# Patient Record
Sex: Female | Born: 1970 | Race: White | Hispanic: No | Marital: Married | Smoking: Former smoker
Health system: Southern US, Community
[De-identification: ages and names within clinical notes are randomized; demographics above are authoritative.]

## PROBLEM LIST (undated history)

## (undated) DIAGNOSIS — N200 Calculus of kidney: Secondary | ICD-10-CM

## (undated) DIAGNOSIS — F329 Major depressive disorder, single episode, unspecified: Secondary | ICD-10-CM

## (undated) DIAGNOSIS — N390 Urinary tract infection, site not specified: Secondary | ICD-10-CM

## (undated) DIAGNOSIS — K589 Irritable bowel syndrome without diarrhea: Secondary | ICD-10-CM

## (undated) DIAGNOSIS — K219 Gastro-esophageal reflux disease without esophagitis: Secondary | ICD-10-CM

## (undated) DIAGNOSIS — D649 Anemia, unspecified: Secondary | ICD-10-CM

## (undated) DIAGNOSIS — J45909 Unspecified asthma, uncomplicated: Secondary | ICD-10-CM

## (undated) DIAGNOSIS — F32A Depression, unspecified: Secondary | ICD-10-CM

## (undated) DIAGNOSIS — Z8619 Personal history of other infectious and parasitic diseases: Secondary | ICD-10-CM

## (undated) DIAGNOSIS — L509 Urticaria, unspecified: Secondary | ICD-10-CM

## (undated) HISTORY — DX: Anemia, unspecified: D64.9

## (undated) HISTORY — DX: Major depressive disorder, single episode, unspecified: F32.9

## (undated) HISTORY — DX: Depression, unspecified: F32.A

## (undated) HISTORY — PX: ADENOIDECTOMY: SUR15

## (undated) HISTORY — PX: TONSILLECTOMY: SUR1361

## (undated) HISTORY — DX: Personal history of other infectious and parasitic diseases: Z86.19

## (undated) HISTORY — DX: Irritable bowel syndrome, unspecified: K58.9

## (undated) HISTORY — DX: Gastro-esophageal reflux disease without esophagitis: K21.9

## (undated) HISTORY — DX: Calculus of kidney: N20.0

## (undated) HISTORY — DX: Urinary tract infection, site not specified: N39.0

## (undated) HISTORY — PX: ABDOMINAL HYSTERECTOMY: SHX81

## (undated) HISTORY — DX: Unspecified asthma, uncomplicated: J45.909

## (undated) HISTORY — DX: Urticaria, unspecified: L50.9

---

## 2003-02-15 ENCOUNTER — Ambulatory Visit (HOSPITAL_BASED_OUTPATIENT_CLINIC_OR_DEPARTMENT_OTHER): Admission: RE | Admit: 2003-02-15 | Discharge: 2003-02-15 | Payer: Self-pay | Admitting: Gynecology

## 2004-11-20 ENCOUNTER — Ambulatory Visit: Payer: Self-pay | Admitting: Family Medicine

## 2004-12-03 ENCOUNTER — Ambulatory Visit: Payer: Self-pay | Admitting: Family Medicine

## 2004-12-03 ENCOUNTER — Other Ambulatory Visit: Admission: RE | Admit: 2004-12-03 | Discharge: 2004-12-03 | Payer: Self-pay | Admitting: Family Medicine

## 2005-02-18 ENCOUNTER — Ambulatory Visit: Payer: Self-pay | Admitting: Family Medicine

## 2009-09-10 ENCOUNTER — Emergency Department (HOSPITAL_COMMUNITY): Admission: EM | Admit: 2009-09-10 | Discharge: 2009-09-10 | Payer: Self-pay | Admitting: Emergency Medicine

## 2009-09-10 IMAGING — CR DG OS CALCIS 2+V*R*
2 series · 2 of 2 positions shown · non-contrast
Comparison: None

CLINICAL DATA: Right heel pain.

RIGHT OS CALCIS - 2+ VIEW

[view not recorded (1 of 2)]
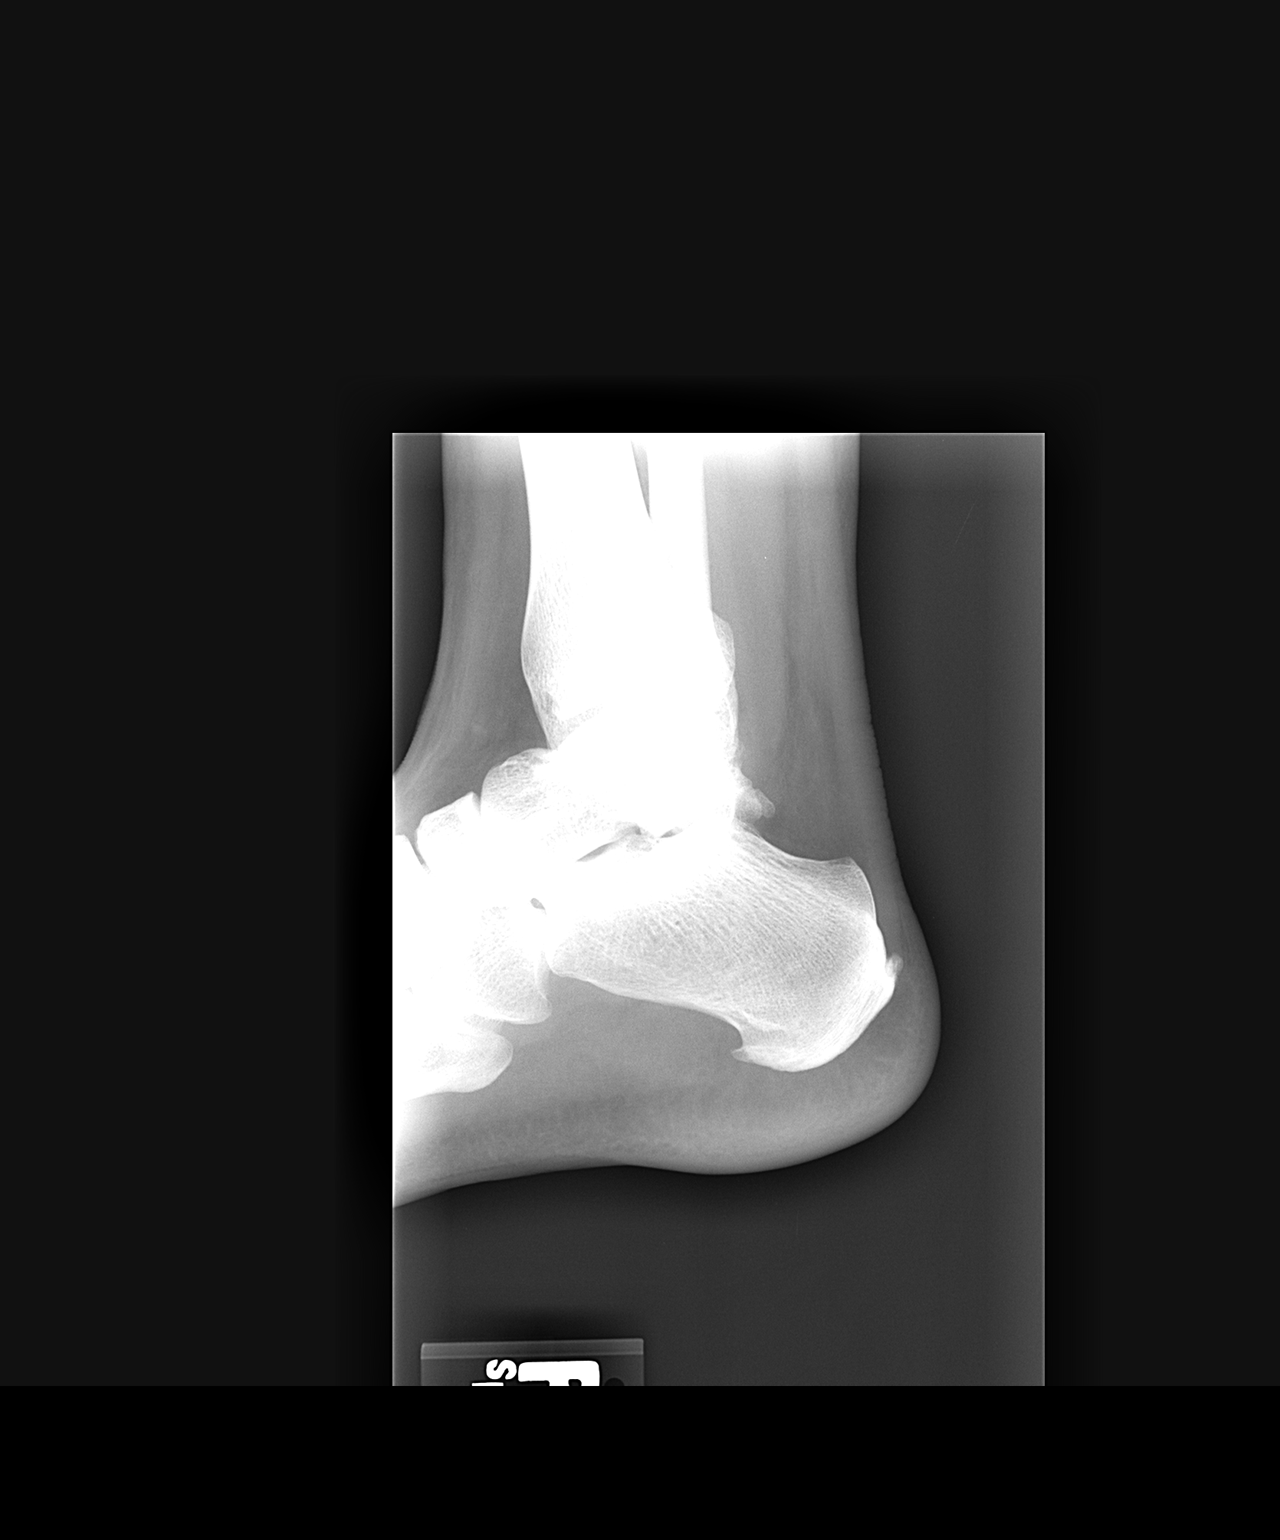

[view not recorded (2 of 2)]
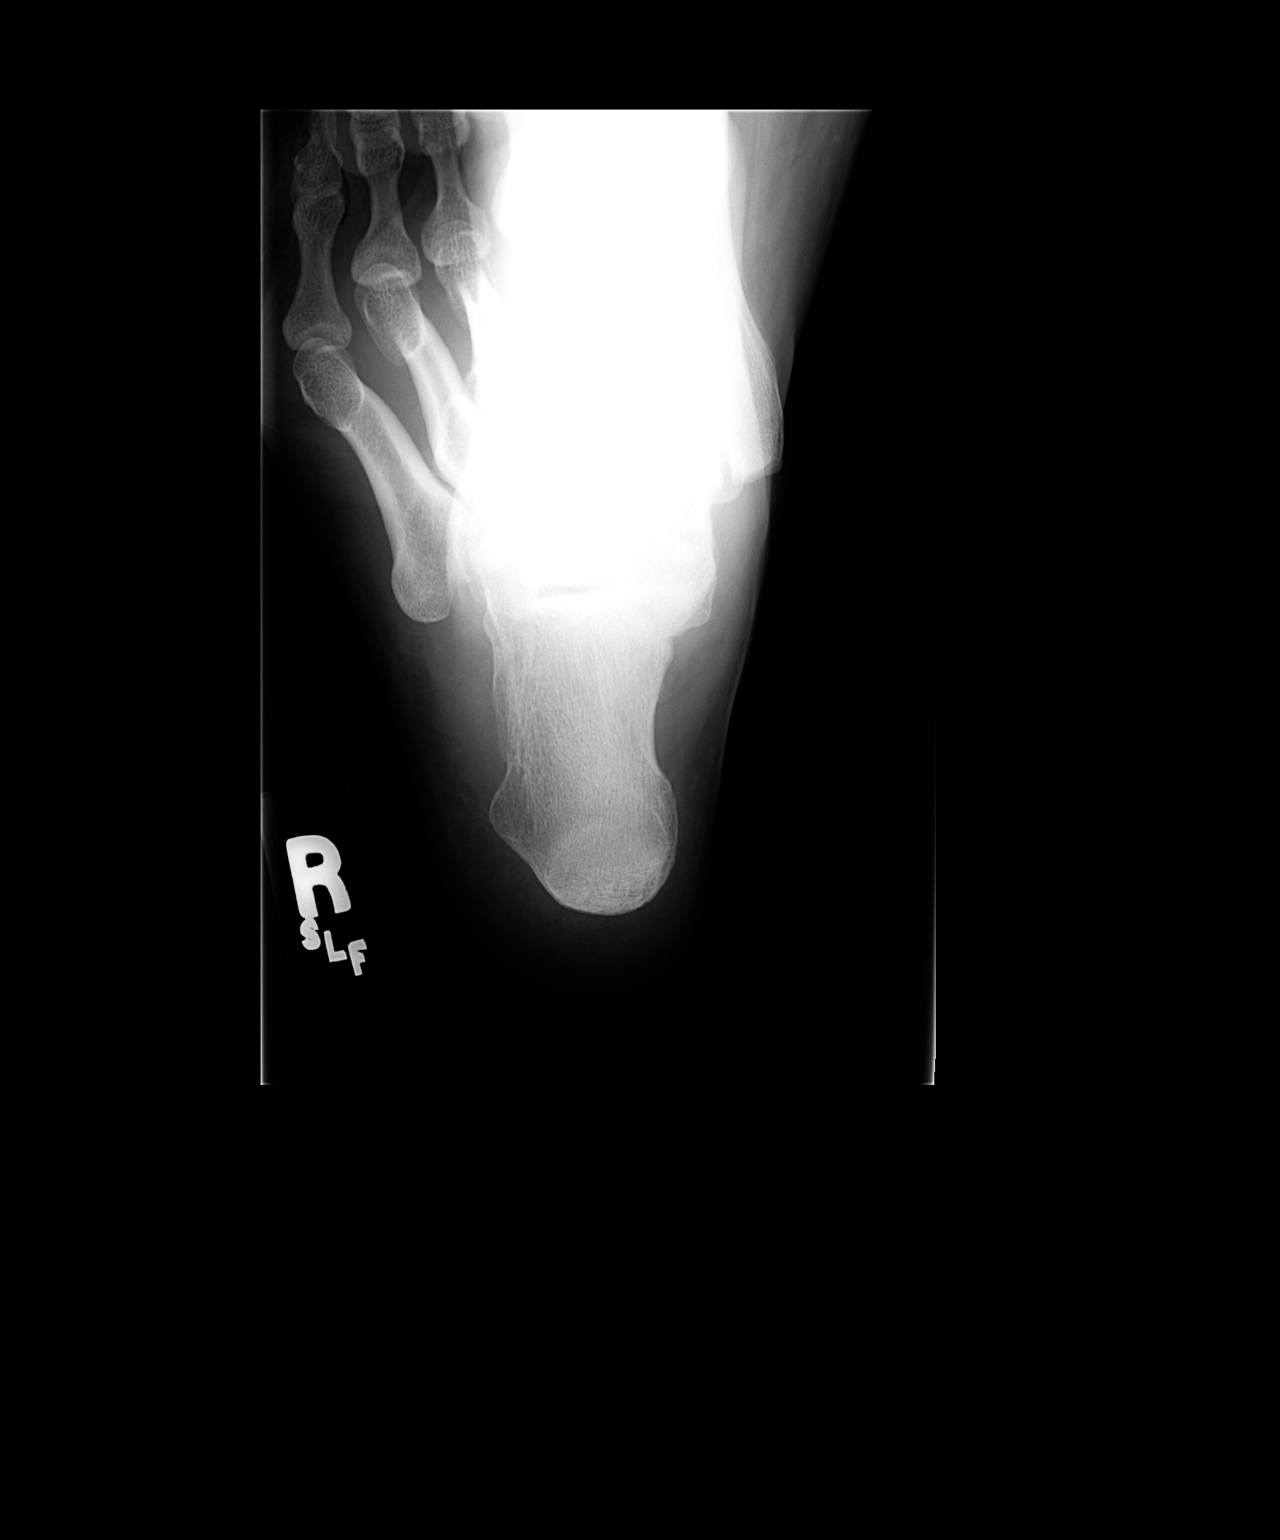

[2 of 2 positions shown; findings below may reference images not displayed]

FINDINGS: Plantar calcaneal spur is present. No acute bony
abnormality.  Specifically, no fracture, subluxation, or
dislocation.  Soft tissues are intact.
IMPRESSION: Small plantar calcaneal spur.

## 2010-03-18 ENCOUNTER — Ambulatory Visit: Payer: Self-pay | Admitting: Family Medicine

## 2010-03-18 DIAGNOSIS — K219 Gastro-esophageal reflux disease without esophagitis: Secondary | ICD-10-CM | POA: Insufficient documentation

## 2010-03-18 DIAGNOSIS — F32A Depression, unspecified: Secondary | ICD-10-CM | POA: Insufficient documentation

## 2010-03-18 DIAGNOSIS — F329 Major depressive disorder, single episode, unspecified: Secondary | ICD-10-CM

## 2010-03-18 DIAGNOSIS — M5417 Radiculopathy, lumbosacral region: Secondary | ICD-10-CM | POA: Insufficient documentation

## 2010-03-18 DIAGNOSIS — F3289 Other specified depressive episodes: Secondary | ICD-10-CM | POA: Insufficient documentation

## 2010-03-18 DIAGNOSIS — G43909 Migraine, unspecified, not intractable, without status migrainosus: Secondary | ICD-10-CM | POA: Insufficient documentation

## 2010-03-18 DIAGNOSIS — D649 Anemia, unspecified: Secondary | ICD-10-CM | POA: Insufficient documentation

## 2010-03-18 DIAGNOSIS — F3342 Major depressive disorder, recurrent, in full remission: Secondary | ICD-10-CM | POA: Insufficient documentation

## 2010-03-18 LAB — CONVERTED CEMR LAB
AST: 19 units/L (ref 0–37)
Albumin: 3.6 g/dL (ref 3.5–5.2)
Alkaline Phosphatase: 45 units/L (ref 39–117)
Basophils Absolute: 0 10*3/uL (ref 0.0–0.1)
Bilirubin, Direct: 0.1 mg/dL (ref 0.0–0.3)
CO2: 28 meq/L (ref 19–32)
Creatinine, Ser: 0.6 mg/dL (ref 0.4–1.2)
Eosinophils Absolute: 0.2 10*3/uL (ref 0.0–0.7)
Eosinophils Relative: 2.3 % (ref 0.0–5.0)
HCT: 36.3 % (ref 36.0–46.0)
HDL: 38.9 mg/dL — ABNORMAL LOW (ref >39.00)
LDL Cholesterol: 106 mg/dL — ABNORMAL HIGH (ref 0–99)
Lymphocytes Relative: 33.1 % (ref 12.0–46.0)
Lymphs Abs: 2.3 10*3/uL (ref 0.7–4.0)
Monocytes Absolute: 0.3 10*3/uL (ref 0.1–1.0)
Monocytes Relative: 3.6 % (ref 3.0–12.0)
Neutro Abs: 4.2 10*3/uL (ref 1.4–7.7)
Potassium: 3.8 meq/L (ref 3.5–5.1)
RDW: 13.2 % (ref 11.5–14.6)
Total CHOL/HDL Ratio: 4
Total Protein: 5.9 g/dL — ABNORMAL LOW (ref 6.0–8.3)
Triglycerides: 84 mg/dL (ref 0.0–149.0)
VLDL: 16.8 mg/dL (ref 0.0–40.0)

## 2010-03-30 ENCOUNTER — Ambulatory Visit: Payer: Self-pay | Admitting: Family Medicine

## 2010-03-30 DIAGNOSIS — F411 Generalized anxiety disorder: Secondary | ICD-10-CM | POA: Insufficient documentation

## 2010-03-30 DIAGNOSIS — G2581 Restless legs syndrome: Secondary | ICD-10-CM | POA: Insufficient documentation

## 2010-03-30 DIAGNOSIS — K589 Irritable bowel syndrome without diarrhea: Secondary | ICD-10-CM | POA: Insufficient documentation

## 2010-04-28 ENCOUNTER — Telehealth (INDEPENDENT_AMBULATORY_CARE_PROVIDER_SITE_OTHER): Payer: Self-pay | Admitting: *Deleted

## 2010-05-27 ENCOUNTER — Ambulatory Visit
Admission: RE | Admit: 2010-05-27 | Discharge: 2010-05-27 | Payer: Self-pay | Source: Home / Self Care | Attending: Family Medicine | Admitting: Family Medicine

## 2010-05-27 DIAGNOSIS — B379 Candidiasis, unspecified: Secondary | ICD-10-CM | POA: Insufficient documentation
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
## 2010-06-09 NOTE — Assessment & Plan Note (Signed)
Summary: CPX//PH   Vital Signs:  Patient profile:   40 year old female Height:      64 inches Weight:      158 pounds Pulse rate:   72 / minute BP sitting:   118 / 80  (left arm)  Vitals Entered By: Doristine Devoid CMA (March 30, 2010 10:02 AM) CC: CPX    History of Present Illness: 40 yo woman here today for CPE.  UTD on pap.  1) RLS- reports sxs increase when back pain increases.  pain has been more recently w/ change in weather.  last night pt was awake due to constant motion of legs.  2) Anxiety- feeling that she is doing well on 20mg  of citalopram.  previously on Valium but had excessive sedation.  therapy is intensifying and IBS is flaring- 'i just feel like i'm shaking all the time'.  Preventive Screening-Counseling & Management  Alcohol-Tobacco     Alcohol drinks/day: 0     Smoking Status: current     Smoking Cessation Counseling: yes     Smoke Cessation Stage: contemplative     Packs/Day: 0.75      Sexual History:  currently monogamous.        Drug Use:  never.    Current Medications (verified): 1)  Citalopram Hydrobromide 20 Mg Tabs (Citalopram Hydrobromide) .... Take One Tablet By Mouth Daily 2)  Cyclobenzaprine Hcl 5 Mg Tabs (Cyclobenzaprine Hcl) .Marland Kitchen.. 1 Tab By Mouth Three Times A Day As Needed For Back/neck Spasm.  May Cause Drowsiness 3)  Clonazepam 0.5 Mg Tabs (Clonazepam) .Marland Kitchen.. 1 Tab By Mouth Two Times A Day  Allergies (verified): No Known Drug Allergies  Past History:  Past medical, surgical, family and social histories (including risk factors) reviewed, and no changes noted (except as noted below).  Past Medical History: Anemia-NOS (after C-section) Depression GERD UTI's hx of Chicken Pox MIgraines  Kidney stones IBS  Family History: Reviewed history from 03/18/2010 and no changes required. CAD-mother-bypass,father MI age 2,paternal grandparent HTN-mother,paternal grandmother DM-maternal grandfather BREAST CA-no COLON CA-no  Social  History: Reviewed history from 03/18/2010 and no changes required. married student has special needs daughter- preemie, had strokeSmoking Status:  current Packs/Day:  0.75  Review of Systems       The patient complains of headaches and depression.  The patient denies anorexia, fever, weight loss, weight gain, vision loss, decreased hearing, hoarseness, chest pain, syncope, dyspnea on exertion, peripheral edema, prolonged cough, abdominal pain, melena, hematochezia, severe indigestion/heartburn, hematuria, suspicious skin lesions, abnormal bleeding, enlarged lymph nodes, and breast masses.         HAs- stress related  Physical Exam  General:  Well-developed,well-nourished,in no acute distress; alert,appropriate and cooperative throughout examination Head:  Normocephalic and atraumatic without obvious abnormalities. No apparent alopecia or balding. Eyes:  No corneal or conjunctival inflammation noted. EOMI. Perrla. Funduscopic exam benign, without hemorrhages, exudates or papilledema. Vision grossly normal. Ears:  External ear exam shows no significant lesions or deformities.  Otoscopic examination reveals clear canals, tympanic membranes are intact bilaterally without bulging, retraction, inflammation or discharge. Hearing is grossly normal bilaterally. Nose:  External nasal examination shows no deformity or inflammation. Nasal mucosa are pink and moist without lesions or exudates. Mouth:  Oral mucosa and oropharynx without lesions or exudates.  Teeth in good repair. Neck:  No deformities, masses, or tenderness noted. Lungs:  Normal respiratory effort, chest expands symmetrically. Lungs are clear to auscultation, no crackles or wheezes. Heart:  Normal rate and regular rhythm. S1  and S2 normal without gallop, murmur, click, rub or other extra sounds. Abdomen:  soft, NT/ND, +BS Genitalia:  deferred to GYN Pulses:  +2 carotid, radial, DP Extremities:  No clubbing, cyanosis, edema, or deformity  noted with normal full range of motion of all joints.   Neurologic:  No cranial nerve deficits noted. Station and gait are normal. Plantar reflexes are down-going bilaterally. DTRs are symmetrical throughout. Sensory, motor and coordinative functions appear intact. Skin:  Intact without suspicious lesions or rashes Cervical Nodes:  No lymphadenopathy noted Axillary Nodes:  No palpable lymphadenopathy Psych:  anxious but good eye contact, appropriate   Impression & Recommendations:  Problem # 1:  PHYSICAL EXAMINATION (ICD-V70.0) Assessment Unchanged pt's PE WNL.  reviewed labs- all look good.  UTD on health maintainence.  anticipatory guidance provided. Orders: Venipuncture (91478)  Problem # 2:  RESTLESS LEGS SYNDROME (ICD-333.94) Assessment: New pt reports sxs have been worsening lately.  will start Klonopin to tx both RLS and anxiety.  Problem # 3:  ANXIETY STATE, UNSPECIFIED (ICD-300.00) Assessment: New continue citalopram as a controller, add Klonopin for anxiety.  continue therapy. Her updated medication list for this problem includes:    Citalopram Hydrobromide 20 Mg Tabs (Citalopram hydrobromide) .Marland Kitchen... Take one tablet by mouth daily    Clonazepam 0.5 Mg Tabs (Clonazepam) .Marland Kitchen... 1 tab by mouth two times a day  Complete Medication List: 1)  Citalopram Hydrobromide 20 Mg Tabs (Citalopram hydrobromide) .... Take one tablet by mouth daily 2)  Cyclobenzaprine Hcl 5 Mg Tabs (Cyclobenzaprine hcl) .Marland Kitchen.. 1 tab by mouth three times a day as needed for back/neck spasm.  may cause drowsiness 3)  Clonazepam 0.5 Mg Tabs (Clonazepam) .Marland Kitchen.. 1 tab by mouth two times a day  Patient Instructions: 1)  Follow up in 1 month to see how the Clonazepam is working 2)  You look great!  Keep up the good work! 3)  Call with any questions or concerns 4)  Happy Holidays!!! Prescriptions: CLONAZEPAM 0.5 MG TABS (CLONAZEPAM) 1 tab by mouth two times a day  #60 x 0   Entered and Authorized by:   Neena Rhymes MD   Signed by:   Neena Rhymes MD on 03/30/2010   Method used:   Print then Give to Patient   RxID:   [id number]    Orders Added: 1)  Venipuncture [62952] 2)  Est. Patient 18-39 years [99395] 3)  Est. Patient Level III [84132]     Preventive Care Screening  Pap Smear:    Date:  03/23/2010    Results:  normal

## 2010-06-11 NOTE — Progress Notes (Signed)
Summary: refill   Phone Note Refill Request Call back at Home Phone [phone] Message from:  Patient on April 28, 2010 2:51 PM  Refills Requested: Medication #1:  CLONAZEPAM 0.5 MG TABS 1 tab by mouth two times a day. Initial call taken by: Doristine Devoid CMA,  April 28, 2010 2:51 PM    Prescriptions: CLONAZEPAM 0.5 MG TABS (CLONAZEPAM) 1 tab by mouth two times a day  #60 x 0   Entered by:   Doristine Devoid CMA   Authorized by:   Neena Rhymes MD   Signed by:   Doristine Devoid CMA on 04/28/2010   Method used:   Printed then [facility]ed to ...       [facility]  [id number]       [address]       [place], [place]  [address]       Ph: [phone] or [phone]       Fax: [phone]   RxID:   [id number]

## 2010-06-11 NOTE — Assessment & Plan Note (Signed)
Summary: followup on clonopine ///sph   Vital Signs:  Patient profile:   40 year old female Weight:      153 pounds BMI:     26.36 Pulse rate:   64 / minute BP sitting:   110 / 64  (left arm)  Vitals Entered By: Doristine Devoid CMA (May 27, 2010 1:32 PM) CC: f/u on meds   History of Present Illness: 40 yo woman here today for f/u on anxiety.  will take 1 tab in the AM and will take 2 tabs at night if needed.  feels less shakey- 'i feel better'.  more active, more energy.  'i'm starting to feel a little bit of normalcy'.  doing well in therapy.  yeast- hx of infxns in Csection scar.  'i have a bad one'.  uses nystatin as needed but tube is 2 yrs out of date.  would like refill.  Current Medications (verified): 1)  Citalopram Hydrobromide 20 Mg Tabs (Citalopram Hydrobromide) .... Take One Tablet By Mouth Daily 2)  Cyclobenzaprine Hcl 5 Mg Tabs (Cyclobenzaprine Hcl) .Marland Kitchen.. 1 Tab By Mouth Three Times A Day As Needed For Back/neck Spasm.  May Cause Drowsiness 3)  Clonazepam 0.5 Mg Tabs (Clonazepam) .Marland Kitchen.. 1 Tab By Mouth Two Times A Day  Allergies (verified): No Known Drug Allergies  Review of Systems      See HPI  Physical Exam  General:  Well-developed,well-nourished,in no acute distress; alert,appropriate and cooperative throughout examination Skin:  yeast along surgical scar Psych:  Cognition and judgment appear intact. Alert and cooperative with normal attention span and concentration. No apparent delusions, illusions, hallucinations   Impression & Recommendations:  Problem # 1:  ANXIETY STATE, UNSPECIFIED (ICD-300.00) Assessment Improved pt doing well on current meds.  energy has improved, less anxious.  no changes. Her updated medication list for this problem includes:    Citalopram Hydrobromide 20 Mg Tabs (Citalopram hydrobromide) .Marland Kitchen... Take one tablet by mouth daily    Clonazepam 0.5 Mg Tabs (Clonazepam) .Marland Kitchen... 1 tab by mouth two times a day  Problem # 2:  CANDIDIASIS  (ICD-112.9) Assessment: New refill of nystatin provided.  Complete Medication List: 1)  Citalopram Hydrobromide 20 Mg Tabs (Citalopram hydrobromide) .... Take one tablet by mouth daily 2)  Cyclobenzaprine Hcl 5 Mg Tabs (Cyclobenzaprine hcl) .Marland Kitchen.. 1 tab by mouth three times a day as needed for back/neck spasm.  may cause drowsiness 3)  Clonazepam 0.5 Mg Tabs (Clonazepam) .Marland Kitchen.. 1 tab by mouth two times a day 4)  Nystatin 100000 Unit/gm Oint (Nystatin) .... Apply to affected area two times a day.  disp 1 large tube  Patient Instructions: 1)  Follow up in 1 year for your complete physical- sooner if needed 2)  Call with any questions or concerns 3)  You look great!  I'm so glad you're feeling better! 4)  Happy New Year! Prescriptions: NYSTATIN 100000 UNIT/GM OINT (NYSTATIN) apply to affected area two times a day.  disp 1 large tube  #1 x 3   Entered and Authorized by:   Neena Rhymes MD   Signed by:   Neena Rhymes MD on 05/27/2010   Method used:   [facility]  [address] [id number]       [address]       [place], [place]  [address]       Ph: [phone] or [phone]  Fax: [phone]   RxID:   [id number] CITALOPRAM HYDROBROMIDE 20 MG TABS (CITALOPRAM HYDROBROMIDE) take one tablet by mouth daily  #30 x 6   Entered and Authorized by:   Neena Rhymes MD   Signed by:   Neena Rhymes MD on 05/27/2010   Method used:   Print then Give to Patient   RxID:   [id number] CLONAZEPAM 0.5 MG TABS (CLONAZEPAM) 1 tab by mouth two times a day  #60 x 1   Entered and Authorized by:   Neena Rhymes MD   Signed by:   Neena Rhymes MD on 05/27/2010   Method used:   Print then Give to Patient   RxID:   [id number]    Orders Added: 1)  Est. Patient Level III [44034]

## 2010-08-31 ENCOUNTER — Other Ambulatory Visit: Payer: Self-pay | Admitting: *Deleted

## 2010-08-31 MED ORDER — CLONAZEPAM 0.5 MG PO TABS: 0.5000 mg | ORAL_TABLET | Freq: Two times a day (BID) | ORAL | Status: DC

## 2010-08-31 NOTE — Telephone Encounter (Signed)
Sent by MD. 

## 2010-09-09 ENCOUNTER — Encounter: Payer: Self-pay | Admitting: Family Medicine

## 2010-09-25 NOTE — Op Note (Signed)
   NAME:  Beverly Elliott, HUSSEY                       ACCOUNT NO.:  [id number]   MEDICAL RECORD NO.:  [id number]                   PATIENT TYPE:  AMB   LOCATION:  [facility]                                 FACILITY:  [facility]   PHYSICIAN:  Timothy P. Fontaine, M.D.           DATE OF BIRTH:  Feb 15, 1971   DATE OF PROCEDURE:  02/15/2003  DATE OF DISCHARGE:                                 OPERATIVE REPORT   PREOPERATIVE DIAGNOSIS:  Menorrhagia.   POSTOPERATIVE DIAGNOSIS:  Menorrhagia.   PROCEDURE:  Endometrial ablation, HTA technique.   SURGEON:  Timothy P. Fontaine, M.D.   ANESTHESIA:  General.   ESTIMATED BLOOD LOSS:  Minimal.   COMPLICATIONS:  None.   SPECIMENS:  None.   FINDINGS:  Pre-ablation hysteroscopy was adequate and normal, noting right  and left tubal osteo.  Fundus, anterior and posterior uterine surfaces,  lower uterine segment and endocervical all normal.  Post-ablative  hysteroscopy reveals a good uniform treatment of the endometrial cavity.   DESCRIPTION OF PROCEDURE:  The patient was taken to the operating room and  underwent general anesthesia.  She was placed in the low dorsal lithotomy  position.  Received a perineal vaginal preparation with Betadine solution.  The bladder emptied with in/out Foley catheterization.  EUA performed.  The  patient was draped in the usual fashion.  The cervix was visualized with a  speculum. The anterior lip was grasped with a single-toothed tenaculum.  The  cervix was then gently and gradually dilated to a #21 dilator.  The HTA  hysteroscope was then introduced without difficulty.  Hysteroscopy was  performed.  After ensuring a tight seal and taking appropriate thermal  precautions, the HTA was initiated and completed according to the  manufacturer's recommendations.  At the end of the cooling phase the  instruments were removed.  Minimal bleeding visualized.  The patient was  placed in the supine position, awakened without difficulty and  taken to the  recovery room in good condition; having tolerated the procedure well.                                               Timothy P. Audie Box, M.D.    TPF/MEDQ  D:  02/15/2003  T:  02/15/2003  Job:  [id number]

## 2010-09-25 NOTE — H&P (Signed)
Beverly Elliott, Beverly Elliott                         ACCOUNT NO.:  [id number]   MEDICAL RECORD NO.:  [id number]                   PATIENT TYPE:  AMB   LOCATION:  [facility]                                 FACILITY:  [facility]   PHYSICIAN:  Timothy P. Fontaine, M.D.           DATE OF BIRTH:  07-10-1970   DATE OF ADMISSION:  02/15/2003  DATE OF DISCHARGE:                                HISTORY & PHYSICAL   CHIEF COMPLAINT:  Menorrhagia.   HISTORY OF PRESENT ILLNESS:  A 40 year old G5, P5 female, status post tubal  sterilization with increasing menorrhagia.  The patient notes this has  progressively gotten worse such that she uses three boxes of tampons the  first day or two of her cycle, and she is unable to leave the house which  has become socially unacceptable.  She had been tried on oral contraceptives  in the past and could not tolerate the side effect to include headaches, and  I discussed alternative options with her to include expectant management,  hormonal suppression such as Depo, Jearld Adjutant IUD, endometrial ablation, and  hysterectomy.  The patient has decided to proceed with endometrial ablation.   PAST MEDICAL HISTORY:  Unremarkable.   PAST SURGICAL HISTORY:  1. Cesarean section x5 with tubal sterilization.  2. D&C.  3. Hysteroscopy.  4. Tonsillectomy.   ALLERGIES:  None known.   CURRENT MEDICATIONS:  1. Flexeril and Soma for back discomfort due to bulging disk.  2. Allegra p.r.n. for seasonal allergies.   SOCIAL HISTORY:  Smoking 1/2 pack per day.   FAMILY HISTORY:  Noncontributory.   ADMISSION PHYSICAL EXAMINATION:  VITAL SIGNS:  Afebrile.  Vital signs are  stable.  HEENT:  Normal.  LUNGS:  Clear.  CARDIAC:  Regular rate without murmurs, rubs, or gallops.  ABDOMEN:  Benign.  PELVIC:  External BUS and vagina normal.  Cervix normal.  Uterus normal  size, anteverted.  Adnexa without masses or tenderness.   ASSESSMENT:  A 40 year old G5, P5 female, status post tubal  sterilization,  increasing unacceptable menorrhagia for endometrial ablation.  Outpatient  evaluation included a hemoglobin of 12, a marginal prolactin at 31 with a  repeat of 34, TSH which is normal, ultrasound to include sonohistogram which  shows a normal endometrial cavity, and an endometrial biopsy showing benign  secretory endometrium.  The patient has received a shot of Depo-Lupron for  suppression a month prior to the ablation.  I reviewed with the patient what  is involved with ablation, to include the expected the intraoperative and  postoperative courses, to include the instrumentation and the use of hot  fluid.  The absolute necessity of sterility following the procedure was  stressed, and although she has had a tubal sterilization, she understands  that future pregnancies are contraindicated.  The acute intraoperative risks  include uterine perforation, damage to internal organs, including bladder,  bowel, ureters, vessels, and nerves necessitating major exploratory or  reparative  surgeries and future reparative surgeries, including ostomy  formation was all discussed with her.  The risk of infection as well as the  risk of transfusion if hemorrhage occurs was all reviewed with her.  The  thermal risks including burning and injuries of the vagina and vulva were  also reviewed.  Lastly, the long-term issues to include endometrial cancer  detection in the future, as well as hematometria were discussed, understood,  and accepted.  The patient's questions were answered to her satisfaction,  she is ready to proceed with surgery.                                               Timothy P. Audie Box, M.D.    TPF/MEDQ  D:  02/11/2003  T:  02/11/2003  Job:  [id number]

## 2010-09-25 NOTE — H&P (Signed)
Beverly Elliott, Beverly Elliott                         ACCOUNT NO.:  [id number]   MEDICAL RECORD NO.:  [id number]                   PATIENT TYPE:  AMB   LOCATION:  [facility]                                 FACILITY:  [facility]   PHYSICIAN:  Timothy P. Fontaine, M.D.           DATE OF BIRTH:  1971/02/01   DATE OF ADMISSION:  02/15/2003  DATE OF DISCHARGE:                                HISTORY & PHYSICAL   CHIEF COMPLAINT:  Menorrhagia.   HISTORY OF PRESENT ILLNESS:  This is a 40 year old, G5, P5, female status  post tubal sterilization who presented complaining of increasing menorrhagia  that is becoming socially unacceptable.  The patient notes she goes through  three boxes of tampons the first day or two of her cycle.  She is unable to  leave her house and this is interrupting her life.  She has had trials of  oral contraceptives in the past and was unable to tolerate the side effects  to include headaches.  I reviewed with her the options which are to include  expectant management, hormonal suppressions such as Depo, Mirena, IUD,  endometrial ablation, and hysterectomy.  The patient is admitted at this  time for endometrial ablation.   Her preoperative evaluation includes normal hormone studies, noted a low  level positive prolactin in the 30 range, which has been stable over the  last several years.  She had a negative sonohysterogram and a negative  endometrial biopsy.  She also has a normal Pap smear.   PAST MEDICAL HISTORY:  Unremarkable.   PAST SURGICAL HISTORY:  1. Cesarean section x5 with BTL.  2. Tonsillectomy.  3. D&C.  4. Hysteroscopy in 2002.  5. She is being followed for bulging lower lumbar disks for which she uses     Flexeril and Soma intermittently.   SOCIAL HISTORY:  Significant for smoking.   ALLERGIES:  No medications.   REVIEW OF SYSTEMS:  Noncontributory.   FAMILY HISTORY:  Noncontributory.   SOCIAL HISTORY:  Noncontributory.   PHYSICAL EXAMINATION:  VITAL  SIGNS:  Afebrile.  Vital signs are stable.  HEENT:  Normal and clear.  CARDIAC:  Regular rate without murmurs, rubs, or gallops.  ABDOMEN:  Benign.  PELVIC:  External BUS and vagina normal.  Cervix normal.  Uterus anteverted,  normal size, nontender.  Adnexa without masses or tenderness.   ASSESSMENT:  This is a 40 year old, G5, P5, female status post tubal  sterilization, increasing menorrhagia socially unacceptable, failure of oral  contraceptives who is admitted for ablation.  The risks, benefits,  indications, and alternatives for the procedure were reviewed with the  patient, and I discussed the acute intraoperative risks to include uterine  perforation, damage to internal organs, including bowel, bladder, ureters,  vessels, and nerves necessitating major exploratory reparative surgeries and  future reparative surgeries including ostomy formation.  The risks of  infection, as well as the risk of transfusion if hemorrhage occurs was  all  reviewed with her.  The thermal risks involved with ablation was assessed to  include burning injuries and injuries to vagina and vulva were discussed  with her.  The long-term issues of ablation were also reviewed to include  the endometrial cancer detection issues in the future, as well as  hematometra.  She also understands that absolute sterility is required, that  even if she could get pregnant she should not get pregnant given the  endometrial ablation, and again she has had a tubal ligation, understands  and accepts this issue.  The patient's questions were answered to her  satisfaction.  She is ready to proceed with surgery.  She has received  Lupron suppression the month prior to the procedure, and anticipated  preoperative blood values pending at this time include a CBC and a beta hCG.                                               Timothy P. Audie Box, M.D.    TPF/MEDQ  D:  01/30/2003  T:  01/30/2003  Job:  [id number]

## 2010-12-11 ENCOUNTER — Other Ambulatory Visit: Payer: Self-pay | Admitting: Family Medicine

## 2010-12-11 MED ORDER — CLONAZEPAM 0.5 MG PO TABS: 0.5000 mg | ORAL_TABLET | Freq: Two times a day (BID) | ORAL | Status: DC

## 2010-12-11 NOTE — Telephone Encounter (Signed)
OK X1 

## 2010-12-11 NOTE — Telephone Encounter (Signed)
Last filled 08-31-10 #60 1, last OV 05-27-10

## 2010-12-17 ENCOUNTER — Ambulatory Visit (INDEPENDENT_AMBULATORY_CARE_PROVIDER_SITE_OTHER): Payer: BC Managed Care – PPO | Admitting: Family Medicine

## 2010-12-17 ENCOUNTER — Other Ambulatory Visit: Payer: Self-pay | Admitting: Family Medicine

## 2010-12-17 ENCOUNTER — Encounter: Payer: Self-pay | Admitting: Family Medicine

## 2010-12-17 VITALS — BP 120/76 | Temp 98.5°F | Ht 63.0 in | Wt 145.2 lb

## 2010-12-17 DIAGNOSIS — N63 Unspecified lump in unspecified breast: Secondary | ICD-10-CM | POA: Insufficient documentation

## 2010-12-17 NOTE — Assessment & Plan Note (Signed)
Pt's breast mass in question feels more consistent w/ fibrocystic change than definitive mass but will get diagnostic mammo to ensure area is benign.  Pt expressed understanding and is in agreement w/ plan.

## 2010-12-17 NOTE — Progress Notes (Signed)
  Subjective:    Patient ID: Beverly Elliott, female    DOB: March 11, 1971, 40 y.o.   MRN: [id number]  HPI R breast lump- has been present for at least 8 months.  Denies pain but some intermittent underarm soreness.  No drainage from breast.  No redness or warmth to the skin.  Has hx of fibrocystic breasts.  No family hx.  Losing sensation on R side of nipple.   Review of Systems For ROS see HPI     Objective:   Physical Exam  Vitals reviewed. Constitutional: She appears well-developed and well-nourished. No distress.  Pulmonary/Chest: Right breast exhibits mass (firm area w/out discrete nodularity at 9 o'clock position near areola). Right breast exhibits no inverted nipple, no nipple discharge, no skin change and no tenderness. Left breast exhibits no inverted nipple, no mass, no nipple discharge, no skin change and no tenderness.          Assessment & Plan:

## 2010-12-17 NOTE — Patient Instructions (Signed)
Try not to stress about this We'll call you with your mammo appt Hang in there!!!

## 2010-12-21 ENCOUNTER — Ambulatory Visit
Admission: RE | Admit: 2010-12-21 | Discharge: 2010-12-21 | Disposition: A | Discharge status: Home / Self Care | Payer: BC Managed Care – PPO | Source: Ambulatory Visit | Attending: Family Medicine | Admitting: Family Medicine

## 2010-12-21 ENCOUNTER — Other Ambulatory Visit: Payer: Self-pay | Admitting: Family Medicine

## 2010-12-21 DIAGNOSIS — N63 Unspecified lump in unspecified breast: Secondary | ICD-10-CM

## 2010-12-22 ENCOUNTER — Telehealth: Payer: Self-pay

## 2010-12-22 NOTE — Telephone Encounter (Signed)
Message copied by Beverely Low on Tue Dec 22, 2010 11:59 AM ------      Message from: Sheliah Hatch      Created: Mon Dec 21, 2010  2:48 PM       Please make sure pt knows that her breast US was normal- just asymmetrical, thickened tissue

## 2010-12-22 NOTE — Telephone Encounter (Signed)
Pt.notified

## 2010-12-23 ENCOUNTER — Other Ambulatory Visit: Payer: BC Managed Care – PPO

## 2010-12-24 ENCOUNTER — Ambulatory Visit: Payer: Self-pay | Admitting: Family Medicine

## 2011-01-26 ENCOUNTER — Telehealth: Payer: Self-pay

## 2011-01-26 MED ORDER — CLONAZEPAM 1 MG PO TABS: 1.0000 mg | ORAL_TABLET | Freq: Two times a day (BID) | ORAL | Status: DC | PRN

## 2011-01-26 NOTE — Telephone Encounter (Signed)
Pt called c/o increased stress and clonazepam not working.  Per Dr. Beverely Low, pt can increase 1 mg bid. Rx sent to pharmacy

## 2011-03-15 ENCOUNTER — Other Ambulatory Visit: Payer: Self-pay | Admitting: Family Medicine

## 2011-03-15 MED ORDER — CLONAZEPAM 1 MG PO TABS: 1.0000 mg | ORAL_TABLET | Freq: Two times a day (BID) | ORAL | Status: DC | PRN

## 2011-03-15 NOTE — Telephone Encounter (Signed)
Ok for #60, 1 refill 

## 2011-03-15 NOTE — Telephone Encounter (Signed)
Last OV 12-17-10 last refill 01-26-11

## 2011-03-15 NOTE — Telephone Encounter (Signed)
Manually faxed the signed/printed rx for klonopin 1mg 

## 2011-03-30 ENCOUNTER — Other Ambulatory Visit: Payer: Self-pay | Admitting: Family Medicine

## 2011-03-30 NOTE — Telephone Encounter (Signed)
rx sent to pharmacy by e-script  

## 2011-07-08 ENCOUNTER — Encounter (HOSPITAL_BASED_OUTPATIENT_CLINIC_OR_DEPARTMENT_OTHER): Payer: Self-pay

## 2011-07-08 ENCOUNTER — Emergency Department (HOSPITAL_BASED_OUTPATIENT_CLINIC_OR_DEPARTMENT_OTHER)
Admission: EM | Admit: 2011-07-08 | Discharge: 2011-07-08 | Disposition: A | Discharge status: Home / Self Care | Payer: BC Managed Care – PPO | Attending: Emergency Medicine | Admitting: Emergency Medicine

## 2011-07-08 ENCOUNTER — Emergency Department (INDEPENDENT_AMBULATORY_CARE_PROVIDER_SITE_OTHER): Payer: BC Managed Care – PPO

## 2011-07-08 DIAGNOSIS — R5381 Other malaise: Secondary | ICD-10-CM

## 2011-07-08 DIAGNOSIS — R079 Chest pain, unspecified: Secondary | ICD-10-CM | POA: Insufficient documentation

## 2011-07-08 DIAGNOSIS — R05 Cough: Secondary | ICD-10-CM

## 2011-07-08 DIAGNOSIS — R059 Cough, unspecified: Secondary | ICD-10-CM | POA: Insufficient documentation

## 2011-07-08 DIAGNOSIS — J3489 Other specified disorders of nose and nasal sinuses: Secondary | ICD-10-CM | POA: Insufficient documentation

## 2011-07-08 DIAGNOSIS — R0602 Shortness of breath: Secondary | ICD-10-CM

## 2011-07-08 DIAGNOSIS — J45909 Unspecified asthma, uncomplicated: Secondary | ICD-10-CM | POA: Insufficient documentation

## 2011-07-08 DIAGNOSIS — K589 Irritable bowel syndrome without diarrhea: Secondary | ICD-10-CM | POA: Insufficient documentation

## 2011-07-08 DIAGNOSIS — K219 Gastro-esophageal reflux disease without esophagitis: Secondary | ICD-10-CM | POA: Insufficient documentation

## 2011-07-08 IMAGING — CR DG CHEST 2V
2 series · 2 of 2 positions shown · non-contrast
Comparison: None.

CLINICAL DATA: Shortness of breath and weakness for 5 days.

CHEST - 2 VIEW

[w chest pa]
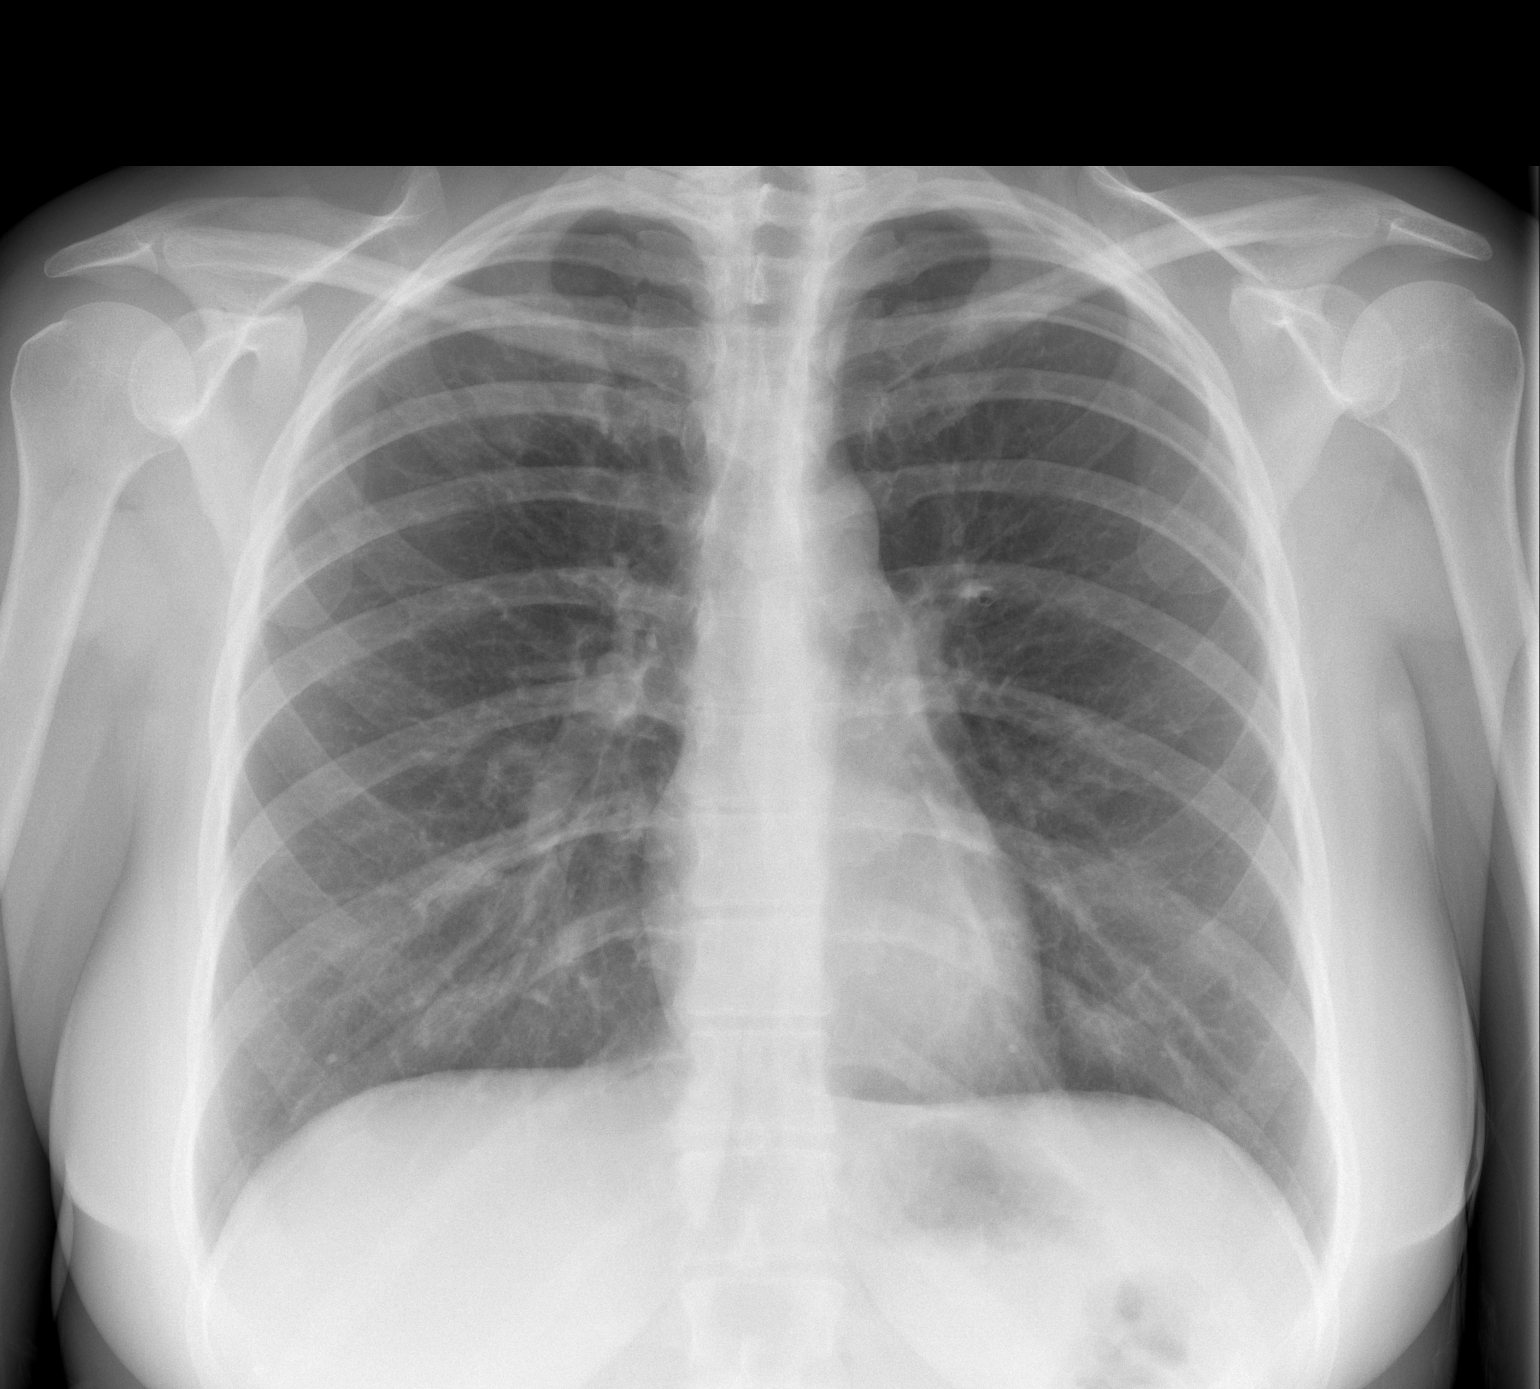

[w chest lat]
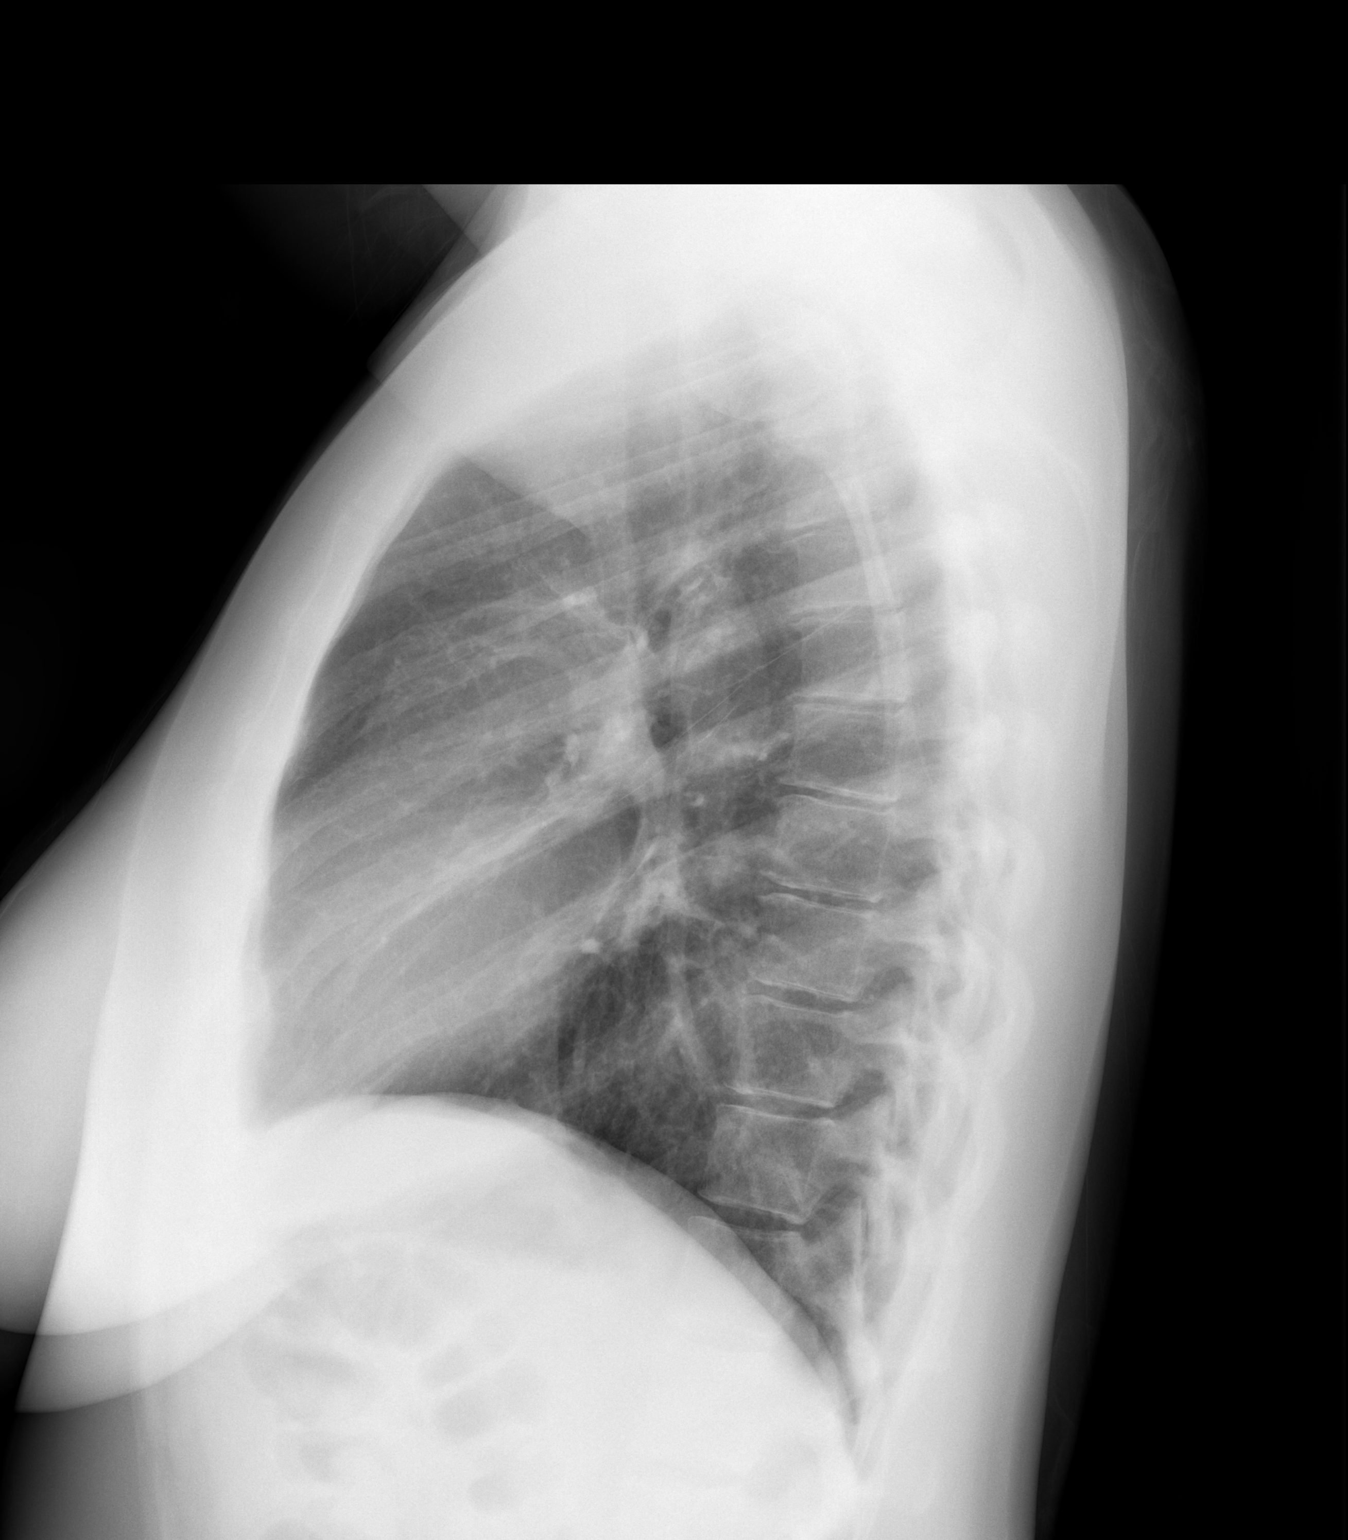

[2 of 2 positions shown; findings below may reference images not displayed]

FINDINGS: The heart size and mediastinal contours are within
normal limits.  Both lungs are clear.  The visualized skeletal
structures are unremarkable.
IMPRESSION: No active cardiopulmonary disease.

## 2011-07-08 MED ORDER — PREDNISONE 50 MG PO TABS: 60.0000 mg | ORAL_TABLET | Freq: Once | ORAL | Status: DC

## 2011-07-08 MED ORDER — ALBUTEROL SULFATE (5 MG/ML) 0.5% IN NEBU
5.0000 mg | INHALATION_SOLUTION | Freq: Once | RESPIRATORY_TRACT | Status: AC
  Administered 2011-07-08: 5 mg via RESPIRATORY_TRACT
  Filled 2011-07-08: qty 1

## 2011-07-08 MED ORDER — PREDNISONE 50 MG PO TABS: 60.0000 mg | ORAL_TABLET | Freq: Every day | ORAL | Status: DC

## 2011-07-08 MED ORDER — ALBUTEROL SULFATE (5 MG/ML) 0.5% IN NEBU
INHALATION_SOLUTION | RESPIRATORY_TRACT | Status: AC
  Administered 2011-07-08: 5 mg
  Filled 2011-07-08: qty 1

## 2011-07-08 MED ORDER — ALBUTEROL SULFATE HFA 108 (90 BASE) MCG/ACT IN AERS: 1.0000 | INHALATION_SPRAY | Freq: Four times a day (QID) | RESPIRATORY_TRACT | Status: DC | PRN

## 2011-07-08 MED ORDER — BENZONATATE 100 MG PO CAPS: 100.0000 mg | ORAL_CAPSULE | Freq: Three times a day (TID) | ORAL | Status: DC

## 2011-07-08 MED ORDER — IPRATROPIUM BROMIDE 0.02 % IN SOLN
RESPIRATORY_TRACT | Status: AC
  Administered 2011-07-08: 0.5 mg
  Filled 2011-07-08: qty 2.5

## 2011-07-08 MED ORDER — ALBUTEROL SULFATE HFA 108 (90 BASE) MCG/ACT IN AERS
2.0000 | INHALATION_SPRAY | Freq: Once | RESPIRATORY_TRACT | Status: DC
  Filled 2011-07-08: qty 6.7

## 2011-07-08 NOTE — ED Provider Notes (Signed)
History     CSN: [id number]  Arrival date & time 07/08/11  1650   First MD Initiated Contact with Patient 07/08/11 1716      Chief Complaint  Patient presents with  . Shortness of Breath    (Consider location/radiation/quality/duration/timing/severity/associated sxs/prior treatment) Patient is a 41 y.o. female presenting with cough. The history is provided by the patient. No language interpreter was used.  Cough This is a new problem. The current episode started more than 1 week ago. The problem occurs constantly. The problem has been gradually worsening. The cough is productive of sputum. There has been no fever. Associated symptoms include chest pain, rhinorrhea, shortness of breath and wheezing. She has tried decongestants for the symptoms. The treatment provided no relief. She is a smoker. Her past medical history is significant for asthma. Her past medical history does not include pneumonia.   patient complains of a bad cough and congestion. Patient complains of wheezing. Patient has been taking Mucinex without relief  Past Medical History  Diagnosis Date  . Anemia   . Depression   . GERD (gastroesophageal reflux disease)   . History of chicken pox   . UTI (lower urinary tract infection)   . Migraine   . Kidney stones   . IBS (irritable bowel syndrome)     Past Surgical History  Procedure Date  . Abdominal hysterectomy   . Tonsillectomy   . Cesarean section     Family History  Problem Relation Age of Onset  . Coronary artery disease Mother   . Coronary artery disease Father   . Heart attack Father   . Hypertension Mother   . Hypertension Paternal Grandmother   . Diabetes Maternal Grandfather     History  Substance Use Topics  . Smoking status: Current Everyday Smoker -- 0.8 packs/day    Types: Cigarettes  . Smokeless tobacco: Not on file  . Alcohol Use: Yes    OB History    Grav Para Term Preterm Abortions TAB SAB Ect Mult Living                   Review of Systems  HENT: Positive for rhinorrhea.   Respiratory: Positive for cough, shortness of breath and wheezing.   Cardiovascular: Positive for chest pain.  All other systems reviewed and are negative.    Allergies  Reglan  Home Medications   Current Outpatient Rx  Name Route Sig Dispense Refill  . CITALOPRAM HYDROBROMIDE 20 MG PO TABS  TAKE 1 TABLET BY MOUTH EVERY DAY 30 tablet 2    PT DUE FOR OFFICE VISIT  . CLONAZEPAM 1 MG PO TABS Oral Take 1 tablet (1 mg total) by mouth 2 (two) times daily as needed for anxiety. 60 tablet 1  . DM-GUAIFENESIN ER 30-600 MG PO TB12 Oral Take 1 tablet by mouth every 12 (twelve) hours. Patient used this medication for congestion.    . CYCLOBENZAPRINE HCL 5 MG PO TABS Oral Take 5 mg by mouth 3 (three) times daily as needed. May cause drowsiness.       BP 120/80  Pulse 78  Temp(Src) 97.8 F (36.6 C) (Oral)  Resp 28  Ht 5\' 3"  (1.6 m)  Wt 155 lb (70.308 kg)  BMI 27.46 kg/m2  SpO2 100%  Physical Exam  Vitals reviewed. Constitutional: She is oriented to person, place, and time. She appears well-developed and well-nourished.  HENT:  Head: Normocephalic and atraumatic.  Right Ear: External ear normal.  Left Ear: External ear  normal.  Nose: Nose normal.  Mouth/Throat: Oropharynx is clear and moist.  Eyes: Conjunctivae and EOM are normal. Pupils are equal, round, and reactive to light.  Neck: Normal range of motion. Neck supple.  Pulmonary/Chest: Effort normal. She has wheezes.       Wheezing all lobes  Abdominal: Soft.  Musculoskeletal: Normal range of motion.  Neurological: She is alert and oriented to person, place, and time. She has normal reflexes.  Skin: Skin is warm.  Psychiatric: She has a normal mood and affect.    ED Course  Procedures (including critical care time)  Labs Reviewed - No data to display Dg Chest 2 View  07/08/2011  *RADIOLOGY REPORT*  Clinical Data: Shortness of breath and weakness for 5 days.  CHEST  - 2 VIEW  Comparison: None.  Findings:  The heart size and mediastinal contours are within normal limits.  Both lungs are clear.  The visualized skeletal structures are unremarkable.  IMPRESSION: No active cardiopulmonary disease.  Original Report Authenticated By: Elsie Stain, M.D.     No diagnosis found.    MDM  Patient given 2 albuterol nebs patient declined a dose of prednisone. She reports she cannot take prednisone but it makes her throw chairs. I gave patient an inhaler here she is given a prescription for a second albuterol inhaler she is given a prescription for Tessalon Perles for the cough she is advised to return to the emergency department if symptoms worsen or change        Langston Masker, [place] 07/08/11 2127

## 2011-07-08 NOTE — ED Notes (Signed)
Pt c/o SOB.  Onset of feeling weak last Tues.  SOB onset Sunday.  Bilateral expiratory wheezing auscultated.  Pt states she has been using Mucinex with no success.

## 2011-07-08 NOTE — ED Provider Notes (Signed)
Medical screening examination/treatment/procedure(s) were performed by non-physician practitioner and as supervising physician I was immediately available for consultation/collaboration.   Honora Searson A Evelena Masci, MD 07/08/11 2350 

## 2011-07-08 NOTE — Discharge Instructions (Signed)
Asthma, Acute Bronchospasm Your exam shows you have asthma, or acute bronchospasm that acts like asthma. Bronchospasm means your air passages become narrowed. These conditions are due to inflammation and airway spasm that cause narrowing of the bronchial tubes in the lungs. This causes you to have wheezing and shortness of breath. CAUSES  Respiratory infections and allergies most often bring on these attacks. Smoking, air pollution, cold air, emotional upsets, and vigorous exercise can also bring them on.  TREATMENT   Treatment is aimed at making the narrowed airways larger. Mild asthma/bronchospasm is usually controlled with inhaled medicines. Albuterol is a common medicine that you breathe in to open spastic or narrowed airways. Some trade names for albuterol are Ventolin or Proventil. Steroid medicine is also used to reduce the inflammation when an attack is moderate or severe. Antibiotics (medications used to kill germs) are only used if a bacterial infection is present.   If you are pregnant and need to use Albuterol (Ventolin or Proventil), you can expect the baby to move more than usual shortly after the medicine is used.  HOME CARE INSTRUCTIONS   Rest.   Drink plenty of liquids. This helps the mucus to remain thin and easily coughed up. Do not use caffeine or alcohol.   Do not smoke. Avoid being exposed to second-hand smoke.   You play a critical role in keeping yourself in good health. Avoid exposure to things that cause you to wheeze. Avoid exposure to things that cause you to have breathing problems. Keep your medications up-to-date and available. Carefully follow your doctor's treatment plan.   When pollen or pollution is bad, keep windows closed and use an air conditioner go to places with air conditioning. If you are allergic to furry pets or birds, find new homes for them or keep them outside.   Take your medicine exactly as prescribed.   Asthma requires careful medical  attention. See your caregiver for follow-up as advised. If you are more than [redacted] weeks pregnant and you were prescribed any new medications, let your Obstetrician know about the visit and how you are doing. Arrange a recheck.  SEEK IMMEDIATE MEDICAL CARE IF:   You are getting worse.   You have trouble breathing. If severe, call 911.   You develop chest pain or discomfort.   You are throwing up or not drinking fluids.   You are not getting better within 24 hours.   You are coughing up yellow, green, brown, or bloody sputum.   You develop a fever over 102 F (38.9 C).   You have trouble swallowing.  MAKE SURE YOU:   Understand these instructions.   Will watch your condition.   Will get help right away if you are not doing well or get worse.  Document Released: 08/11/2006 Document Revised: 01/06/2011 Document Reviewed: 04/10/2007 [facility] Patient Information 2012 [place], [place].Asthma Attack Prevention HOW CAN ASTHMA BE PREVENTED? Currently, there is no way to prevent asthma from starting. However, you can take steps to control the disease and prevent its symptoms after you have been diagnosed. Learn about your asthma and how to control it. Take an active role to control your asthma by working with your caregiver to create and follow an asthma action plan. An asthma action plan guides you in taking your medicines properly, avoiding factors that make your asthma worse, tracking your level of asthma control, responding to worsening asthma, and seeking emergency care when needed. To track your asthma, keep records of your symptoms, check your  peak flow number using a peak flow meter (handheld device that shows how well air moves out of your lungs), and get regular asthma checkups.  Other ways to prevent asthma attacks include:  Use medicines as your caregiver directs.   Identify and avoid things that make your asthma worse (as much as you can).   Keep track of your asthma symptoms and  level of control.   Get regular checkups for your asthma.   With your caregiver, write a detailed plan for taking medicines and managing an asthma attack. Then be sure to follow your action plan. Asthma is an ongoing condition that needs regular monitoring and treatment.   Identify and avoid asthma triggers. A number of outdoor allergens and irritants (pollen, mold, cold air, air pollution) can trigger asthma attacks. Find out what causes or makes your asthma worse, and take steps to avoid those triggers (see below).   Monitor your breathing. Learn to recognize warning signs of an attack, such as slight coughing, wheezing or shortness of breath. However, your lung function may already decrease before you notice any signs or symptoms, so regularly measure and record your peak airflow with a home peak flow meter.   Identify and treat attacks early. If you act quickly, you're less likely to have a severe attack. You will also need less medicine to control your symptoms. When your peak flow measurements decrease and alert you to an upcoming attack, take your medicine as instructed, and immediately stop any activity that may have triggered the attack. If your symptoms do not improve, get medical help.   Pay attention to increasing quick-relief inhaler use. If you find yourself relying on your quick-relief inhaler (such as albuterol), your asthma is not under control. See your caregiver about adjusting your treatment.  IDENTIFY AND CONTROL FACTORS THAT MAKE YOUR ASTHMA WORSE A number of common things can set off or make your asthma symptoms worse (asthma triggers). Keep track of your asthma symptoms for several weeks, detailing all the environmental and emotional factors that are linked with your asthma. When you have an asthma attack, go back to your asthma diary to see which factor, or combination of factors, might have contributed to it. Once you know what these factors are, you can take steps to control  many of them.  Allergies: If you have allergies and asthma, it is important to take asthma prevention steps at home. Asthma attacks (worsening of asthma symptoms) can be triggered by allergies, which can cause temporary increased inflammation of your airways. Minimizing contact with the substance to which you are allergic will help prevent an asthma attack. Animal Dander:   Some people are allergic to the flakes of skin or dried saliva from animals with fur or feathers. Keep these pets out of your home.   If you can't keep a pet outdoors, keep the pet out of your bedroom and other sleeping areas at all times, and keep the door closed.   Remove carpets and furniture covered with cloth from your home. If that is not possible, keep the pet away from fabric-covered furniture and carpets.  Dust Mites:  Many people with asthma are allergic to dust mites. Dust mites are tiny bugs that are found in every home, in mattresses, pillows, carpets, fabric-covered furniture, bedcovers, clothes, stuffed toys, fabric, and other fabric-covered items.   Cover your mattress in a special dust-proof cover.   Cover your pillow in a special dust-proof cover, or wash the pillow each week in hot  water. Water must be hotter than 130 F to kill dust mites. Cold or warm water used with detergent and bleach can also be effective.   Wash the sheets and blankets on your bed each week in hot water.   Try not to sleep or lie on cloth-covered cushions.   Call ahead when traveling and ask for a smoke-free hotel room. Bring your own bedding and pillows, in case the hotel only supplies feather pillows and down comforters, which may contain dust mites and cause asthma symptoms.   Remove carpets from your bedroom and those laid on concrete, if you can.   Keep stuffed toys out of the bed, or wash the toys weekly in hot water or cooler water with detergent and bleach.  Cockroaches:  Many people with asthma are allergic to the  droppings and remains of cockroaches.   Keep food and garbage in closed containers. Never leave food out.   Use poison baits, traps, powders, gels, or paste (for example, boric acid).   If a spray is used to kill cockroaches, stay out of the room until the odor goes away.  Indoor Mold:  Fix leaky faucets, pipes, or other sources of water that have mold around them.   Clean moldy surfaces with a cleaner that has bleach in it.  Pollen and Outdoor Mold:  When pollen or mold spore counts are high, try to keep your windows closed.   Stay indoors with windows closed from late morning to afternoon, if you can. Pollen and some mold spore counts are highest at that time.   Ask your caregiver whether you need to take or increase anti-inflammatory medicine before your allergy season starts.  Irritants:   Tobacco smoke is an irritant. If you smoke, ask your caregiver how you can quit. Ask family members to quit smoking, too. Do not allow smoking in your home or car.   If possible, do not use a wood-burning stove, kerosene heater, or fireplace. Minimize exposure to all sources of smoke, including incense, candles, fires, and fireworks.   Try to stay away from strong odors and sprays, such as perfume, talcum powder, hair spray, and paints.   Decrease humidity in your home and use an indoor air cleaning device. Reduce indoor humidity to below 60 percent. Dehumidifiers or central air conditioners can do this.   Try to have someone else vacuum for you once or twice a week, if you can. Stay out of rooms while they are being vacuumed and for a short while afterward.   If you vacuum, use a dust mask from a hardware store, a double-layered or microfilter vacuum cleaner bag, or a vacuum cleaner with a HEPA filter.   Sulfites in foods and beverages can be irritants. Do not drink beer or wine, or eat dried fruit, processed potatoes, or shrimp if they cause asthma symptoms.   Cold air can trigger an asthma  attack. Cover your nose and mouth with a scarf on cold or windy days.   Several health conditions can make asthma more difficult to manage, including runny nose, sinus infections, reflux disease, psychological stress, and sleep apnea. Your caregiver will treat these conditions, as well.   Avoid close contact with people who have a cold or the flu, since your asthma symptoms may get worse if you catch the infection from them. Wash your hands thoroughly after touching items that may have been handled by people with a respiratory infection.   Get a flu shot every year to  protect against the flu virus, which often makes asthma worse for days or weeks. Also get a pneumonia shot once every five to 10 years.  Drugs:  Aspirin and other painkillers can cause asthma attacks. 10% to 20% of people with asthma have sensitivity to aspirin or a group of painkillers called non-steroidal anti-inflammatory drugs (NSAIDS), such as ibuprofen and naproxen. These drugs are used to treat pain and reduce fevers. Asthma attacks caused by any of these medicines can be severe and even fatal. These drugs must be avoided in people who have known aspirin sensitive asthma. Products with acetaminophen are considered safe for people who have asthma. It is important that people with aspirin sensitivity read labels of all over-the-counter drugs used to treat pain, colds, coughs, and fever.   Beta blockers and ACE inhibitors are other drugs which you should discuss with your caregiver, in relation to your asthma.  ALLERGY SKIN TESTING  Ask your asthma caregiver about allergy skin testing or blood testing (RAST test) to identify the allergens to which you are sensitive. If you are found to have allergies, allergy shots (immunotherapy) for asthma may help prevent future allergies and asthma. With allergy shots, small doses of allergens (substances to which you are allergic) are injected under your skin on a regular schedule. Over a period  of time, your body may become used to the allergen and less responsive with asthma symptoms. You can also take measures to minimize your exposure to those allergens. EXERCISE  If you have exercise-induced asthma, or are planning vigorous exercise, or exercise in cold, humid, or dry environments, prevent exercise-induced asthma by following your caregiver's advice regarding asthma treatment before exercising. Document Released: 04/14/2009 Document Revised: 01/06/2011 Document Reviewed: 04/14/2009 [facility] Patient Information 2012 [place], [place].

## 2011-08-09 ENCOUNTER — Telehealth: Payer: Self-pay | Admitting: Family Medicine

## 2011-08-09 NOTE — Telephone Encounter (Signed)
Refill: Citalopram hbr 20 mg tablet. Qty 30. Take 1 tablet by mouth every day. Last fill 07-09-11.   Clonazepam 1 mg tablet. Take 1 tablet twice a day as needed for anxiety.

## 2011-08-10 MED ORDER — CLONAZEPAM 1 MG PO TABS: 1.0000 mg | ORAL_TABLET | Freq: Two times a day (BID) | ORAL | Status: DC | PRN

## 2011-08-10 MED ORDER — CITALOPRAM HYDROBROMIDE 20 MG PO TABS: 20.0000 mg | ORAL_TABLET | Freq: Every day | ORAL | Status: DC

## 2011-08-10 NOTE — Telephone Encounter (Signed)
Last OV 12-17-10 last refill for clonazepam noted 03-15-11 #60 with 1 refill

## 2011-08-10 NOTE — Telephone Encounter (Signed)
Ok for 6 months of citalopram, klonopin #60,1 refill

## 2011-08-10 NOTE — Telephone Encounter (Signed)
rx sent to pharmacy by e-script for celexa, .rx faxed to pharmacy, manually for klonopin

## 2011-09-05 ENCOUNTER — Emergency Department (HOSPITAL_BASED_OUTPATIENT_CLINIC_OR_DEPARTMENT_OTHER)
Admission: EM | Admit: 2011-09-05 | Discharge: 2011-09-05 | Disposition: A | Discharge status: Home / Self Care | Payer: BC Managed Care – PPO | Attending: Emergency Medicine | Admitting: Emergency Medicine

## 2011-09-05 ENCOUNTER — Encounter (HOSPITAL_BASED_OUTPATIENT_CLINIC_OR_DEPARTMENT_OTHER): Payer: Self-pay | Admitting: Emergency Medicine

## 2011-09-05 DIAGNOSIS — Z87442 Personal history of urinary calculi: Secondary | ICD-10-CM | POA: Insufficient documentation

## 2011-09-05 DIAGNOSIS — F3289 Other specified depressive episodes: Secondary | ICD-10-CM | POA: Insufficient documentation

## 2011-09-05 DIAGNOSIS — F329 Major depressive disorder, single episode, unspecified: Secondary | ICD-10-CM | POA: Insufficient documentation

## 2011-09-05 DIAGNOSIS — R197 Diarrhea, unspecified: Secondary | ICD-10-CM | POA: Insufficient documentation

## 2011-09-05 DIAGNOSIS — Z87440 Personal history of urinary (tract) infections: Secondary | ICD-10-CM | POA: Insufficient documentation

## 2011-09-05 DIAGNOSIS — IMO0001 Reserved for inherently not codable concepts without codable children: Secondary | ICD-10-CM | POA: Insufficient documentation

## 2011-09-05 DIAGNOSIS — F17200 Nicotine dependence, unspecified, uncomplicated: Secondary | ICD-10-CM | POA: Insufficient documentation

## 2011-09-05 DIAGNOSIS — R1110 Vomiting, unspecified: Secondary | ICD-10-CM

## 2011-09-05 LAB — URINALYSIS, ROUTINE W REFLEX MICROSCOPIC
Bilirubin Urine: NEGATIVE
Nitrite: NEGATIVE
Specific Gravity, Urine: 1.037 — ABNORMAL HIGH (ref 1.005–1.030)
pH: 8.5 — ABNORMAL HIGH (ref 5.0–8.0)

## 2011-09-05 LAB — URINE MICROSCOPIC-ADD ON

## 2011-09-05 LAB — BASIC METABOLIC PANEL
BUN: 20 mg/dL (ref 6–23)
CO2: 24 mEq/L (ref 19–32)
Creatinine, Ser: 0.7 mg/dL (ref 0.50–1.10)
GFR calc Af Amer: >90 mL/min (ref >90)
Potassium: 3.7 mEq/L (ref 3.5–5.1)

## 2011-09-05 LAB — CBC
Platelets: 209 10*3/uL (ref 150–400)
WBC: 16.6 10*3/uL — ABNORMAL HIGH (ref 4.0–10.5)

## 2011-09-05 LAB — DIFFERENTIAL
Basophils Relative: 0 % (ref 0–1)
Eosinophils Absolute: 0 10*3/uL (ref 0.0–0.7)
Lymphocytes Relative: 3 % — ABNORMAL LOW (ref 12–46)
Lymphs Abs: 0.5 10*3/uL — ABNORMAL LOW (ref 0.7–4.0)
Monocytes Absolute: 0.5 10*3/uL (ref 0.1–1.0)
Monocytes Relative: 3 % (ref 3–12)
Neutro Abs: 15.5 10*3/uL — ABNORMAL HIGH (ref 1.7–7.7)
Neutrophils Relative %: 94 % — ABNORMAL HIGH (ref 43–77)

## 2011-09-05 MED ORDER — PROMETHAZINE HCL 25 MG PO TABS: 25.0000 mg | ORAL_TABLET | Freq: Four times a day (QID) | ORAL | Status: DC | PRN

## 2011-09-05 MED ORDER — ONDANSETRON HCL 4 MG/2ML IJ SOLN
4.0000 mg | Freq: Once | INTRAMUSCULAR | Status: AC
  Administered 2011-09-05: 4 mg via INTRAVENOUS
  Filled 2011-09-05: qty 2

## 2011-09-05 MED ORDER — SODIUM CHLORIDE 0.9 % IV BOLUS (SEPSIS)
1000.0000 mL | Freq: Once | INTRAVENOUS | Status: AC
  Administered 2011-09-05: 1000 mL via INTRAVENOUS

## 2011-09-05 MED ORDER — PROMETHAZINE HCL 25 MG/ML IJ SOLN
12.5000 mg | Freq: Once | INTRAMUSCULAR | Status: AC
  Administered 2011-09-05: 12.5 mg via INTRAVENOUS
  Filled 2011-09-05: qty 1

## 2011-09-05 NOTE — ED Notes (Signed)
Pt states, "Oh God, my nausea is getting so much worse."  Pt was found lying in a reclined position again.  Asked to sit upright, she states, "this makes the nausea not so bad."  Pt was advised if she needs to vomit to sit upright to avoid aspiration potential.

## 2011-09-05 NOTE — ED Notes (Signed)
Pt sat up to drink po fluids, states that she is feeling more nauseous with position change.  Provider notified, orders received for additional dose of 4mg  Zofran IV push once.

## 2011-09-05 NOTE — ED Notes (Signed)
N/V/D since 4 am.  Family at home sick also.  No fever, some chills.  No dysuria.

## 2011-09-05 NOTE — Discharge Instructions (Signed)
B.R.A.T. Diet Your doctor has recommended the B.R.A.T. diet for you or your child until the condition improves. This is often used to help control diarrhea and vomiting symptoms. If you or your child can tolerate clear liquids, you may have:  Bananas.   Rice.   Applesauce.   Toast (and other simple starches such as crackers, potatoes, noodles).  Be sure to avoid dairy products, meats, and fatty foods until symptoms are better. Fruit juices such as apple, grape, and prune juice can make diarrhea worse. Avoid these. Continue this diet for 2 days or as instructed by your caregiver. Document Released: 04/26/2005 Document Revised: 04/15/2011 Document Reviewed: 10/13/2006 Exit[employer] Patient Information 2012 [employer]. 

## 2011-09-05 NOTE — ED Provider Notes (Signed)
History     CSN: [id number]  Arrival date & time 09/05/11  1055   First MD Initiated Contact with Patient 09/05/11 1157      Chief Complaint  Patient presents with  . Nausea  . Emesis  . Diarrhea    (Consider location/radiation/quality/duration/timing/severity/associated sxs/prior treatment) Patient is a 41 y.o. female presenting with vomiting. The history is provided by the patient. No language interpreter was used.  Emesis  This is a new problem. The current episode started 6 to 12 hours ago. The problem occurs 5 to 10 times per day. The problem has not changed since onset.The emesis has an appearance of stomach contents. There has been no fever. Associated symptoms include diarrhea and myalgias. Pertinent negatives include no abdominal pain and no fever.    Past Medical History  Diagnosis Date  . Anemia   . Depression   . GERD (gastroesophageal reflux disease)   . History of chicken pox   . UTI (lower urinary tract infection)   . Migraine   . Kidney stones   . IBS (irritable bowel syndrome)     Past Surgical History  Procedure Date  . Abdominal hysterectomy   . Tonsillectomy   . Cesarean section     Family History  Problem Relation Age of Onset  . Coronary artery disease Mother   . Coronary artery disease Father   . Heart attack Father   . Hypertension Mother   . Hypertension Paternal Grandmother   . Diabetes Maternal Grandfather     History  Substance Use Topics  . Smoking status: Current Everyday Smoker -- 0.8 packs/day    Types: Cigarettes  . Smokeless tobacco: Not on file  . Alcohol Use: Yes    OB History    Grav Para Term Preterm Abortions TAB SAB Ect Mult Living                  Review of Systems  Constitutional: Negative for fever.  Eyes: Negative.   Respiratory: Negative.   Cardiovascular: Negative.   Gastrointestinal: Positive for diarrhea. Negative for abdominal pain.  Musculoskeletal: Positive for myalgias.  Neurological: Negative.      Allergies  Reglan  Home Medications   Current Outpatient Rx  Name Route Sig Dispense Refill  . ALBUTEROL SULFATE HFA 108 (90 BASE) MCG/ACT IN AERS Inhalation Inhale 1-2 puffs into the lungs every 6 (six) hours as needed for wheezing. 1 Inhaler 0  . CITALOPRAM HYDROBROMIDE 20 MG PO TABS Oral Take 1 tablet (20 mg total) by mouth daily. 30 tablet 5  . CLONAZEPAM 0.5 MG PO TABS Oral Take 1 tablet (0.5 mg total) by mouth 2 (two) times daily. 60 tablet 1  . CLONAZEPAM 1 MG PO TABS Oral Take 1 tablet (1 mg total) by mouth 2 (two) times daily as needed for anxiety. 60 tablet 1  . CYCLOBENZAPRINE HCL 5 MG PO TABS Oral Take 5 mg by mouth 3 (three) times daily as needed. May cause drowsiness.     Marland Kitchen DM-GUAIFENESIN ER 30-600 MG PO TB12 Oral Take 1 tablet by mouth every 12 (twelve) hours. Patient used this medication for congestion.      BP 136/86  Pulse 67  Temp(Src) 97.7 F (36.5 C) (Oral)  Resp 18  Ht 5\' 3"  (1.6 m)  Wt 165 lb (74.844 kg)  BMI 29.23 kg/m2  SpO2 100%  Physical Exam  Nursing note and vitals reviewed. Constitutional: She is oriented to person, place, and time. She appears well-developed.  Neck:  Neck supple.  Cardiovascular: Normal rate and regular rhythm.   Pulmonary/Chest: Effort normal and breath sounds normal.  Abdominal: Soft. Bowel sounds are normal. There is no tenderness.  Musculoskeletal: Normal range of motion.  Neurological: She is alert and oriented to person, place, and time.  Skin: Skin is warm and dry.  Psychiatric: She has a normal mood and affect.    ED Course  Procedures (including critical care time)  Labs Reviewed  CBC - Abnormal; Notable for the following:    WBC 16.6 (*)    Hemoglobin 15.7 (*)    All other components within normal limits  DIFFERENTIAL - Abnormal; Notable for the following:    Neutrophils Relative 94 (*)    Neutro Abs 15.5 (*)    Lymphocytes Relative 3 (*)    Lymphs Abs 0.5 (*)    All other components within normal  limits  BASIC METABOLIC PANEL - Abnormal; Notable for the following:    Glucose, Bld 203 (*)    All other components within normal limits  URINALYSIS, ROUTINE W REFLEX MICROSCOPIC - Abnormal; Notable for the following:    Color, Urine AMBER (*) BIOCHEMICALS MAY BE AFFECTED BY COLOR   Specific Gravity, Urine 1.037 (*)    pH 8.5 (*)    Glucose, UA 100 (*)    Ketones, ur 40 (*)    Protein, ur 100 (*)    Leukocytes, UA TRACE (*)    All other components within normal limits  URINE MICROSCOPIC-ADD ON - Abnormal; Notable for the following:    Squamous Epithelial / LPF MANY (*)    Bacteria, UA MANY (*)    All other components within normal limits  LIPASE, BLOOD   No results found.   1. Vomiting and diarrhea       MDM  Pt is tolerating po here with any problems:symptoms likely viral:pt is okay to follow up for continued symptoms        Teressa Lower, NP 09/05/11 1529

## 2011-09-05 NOTE — ED Provider Notes (Signed)
Medical screening examination/treatment/procedure(s) were performed by non-physician practitioner and as supervising physician I was immediately available for consultation/collaboration.  Doug Sou, MD 09/05/11 1531

## 2011-09-05 NOTE — ED Notes (Signed)
1st liter of fluid still infusing.  Pt resting comfortably.  No complaints at this time.  Will hang 2nd liter of fluid shortly.

## 2011-11-22 ENCOUNTER — Other Ambulatory Visit: Payer: Self-pay | Admitting: Family Medicine

## 2011-11-22 NOTE — Telephone Encounter (Signed)
refill ClonazePAM (Tab) KLONOPIN 1 MG Take 1 tablet (1 mg total) by mouth 2 (two) times daily as needed for anxiety last fill 6.5.13, last ov 8.9.12-appt scheduled for 7.17.13 but not a physical

## 2011-11-24 ENCOUNTER — Encounter: Payer: Self-pay | Admitting: Family Medicine

## 2011-11-24 ENCOUNTER — Ambulatory Visit (INDEPENDENT_AMBULATORY_CARE_PROVIDER_SITE_OTHER): Payer: BC Managed Care – PPO | Admitting: Family Medicine

## 2011-11-24 VITALS — BP 118/75 | HR 85 | Temp 98.0°F | Ht 62.75 in | Wt 170.0 lb

## 2011-11-24 DIAGNOSIS — F3289 Other specified depressive episodes: Secondary | ICD-10-CM

## 2011-11-24 DIAGNOSIS — F329 Major depressive disorder, single episode, unspecified: Secondary | ICD-10-CM

## 2011-11-24 MED ORDER — CITALOPRAM HYDROBROMIDE 40 MG PO TABS: 40.0000 mg | ORAL_TABLET | Freq: Every day | ORAL | Status: DC

## 2011-11-24 NOTE — Progress Notes (Signed)
  Subjective:    Patient ID: Beverly Elliott, female    DOB: [birth year], 41 y.o.   MRN: [id number]  HPI Anxiety/depression- has been on Celexa 20mg  x2 yrs.  Pt not sure whether she needs to go up on dose.  Finding herself wanting to be alone but 'i'm more mentally happy than i have been in year'.  Therapist feels that pt's sxs are due to boredom.  Pt reports seasonal allergies so she spends most of her time inside.  Has never taken OTC allergy meds.  Is stressed over daughter's ODD, husband's recent job change.   Review of Systems For ROS see HPI     Objective:   Physical Exam  Vitals reviewed. Constitutional: She appears well-developed and well-nourished. No distress.  Psychiatric: She has a normal mood and affect. Her behavior is normal. Thought content normal.          Assessment & Plan:

## 2011-11-24 NOTE — Assessment & Plan Note (Signed)
Deteriorated.  Pt is rarely leaving the house, isolating herself.  Increase SSRI to 40mg .  Encouraged her to start going out and engaging in activities.  Will follow closely.

## 2011-11-24 NOTE — Patient Instructions (Addendum)
Schedule your complete physical at your convenience- next 1-2 months (will also recheck mood) Increase to Celexa 40mg  (2 pills of what you have at home and 1 of the new script) Make sure you are getting out!!  Take your Claritin daily to avoid allergy symptoms Try and find something to engage in to avoid the boredom Call with any questions or concerns Hang in there!!!

## 2011-11-24 NOTE — Telephone Encounter (Signed)
Patient was seen today, please advise on refill request for Clonazepam

## 2011-11-24 NOTE — Telephone Encounter (Signed)
Ok for #60, no refills 

## 2011-11-25 MED ORDER — CLONAZEPAM 1 MG PO TABS: 1.0000 mg | ORAL_TABLET | Freq: Two times a day (BID) | ORAL | Status: DC | PRN

## 2011-11-25 NOTE — Telephone Encounter (Signed)
.  rx faxed to pharmacy, manually.  

## 2012-02-01 ENCOUNTER — Telehealth: Payer: Self-pay | Admitting: Family Medicine

## 2012-02-01 MED ORDER — CLONAZEPAM 1 MG PO TABS: 1.0000 mg | ORAL_TABLET | Freq: Two times a day (BID) | ORAL | Status: DC | PRN

## 2012-02-01 NOTE — Telephone Encounter (Signed)
Refill: Clonazepam 1mg  tablet. Take 1 tablet twice a day. Last fill 7.18.13

## 2012-02-01 NOTE — Telephone Encounter (Signed)
.  rx faxed to pharmacy, manually.  

## 2012-02-01 NOTE — Telephone Encounter (Signed)
Ok 40, no RF 

## 2012-02-01 NOTE — Telephone Encounter (Signed)
Last OV 11-24-11, last refill 11-25-11 #60 no refills, BID/PRN

## 2012-02-22 ENCOUNTER — Ambulatory Visit (INDEPENDENT_AMBULATORY_CARE_PROVIDER_SITE_OTHER): Payer: BC Managed Care – PPO | Admitting: Internal Medicine

## 2012-02-22 ENCOUNTER — Encounter: Payer: Self-pay | Admitting: Internal Medicine

## 2012-02-22 ENCOUNTER — Telehealth: Payer: Self-pay | Admitting: Family Medicine

## 2012-02-22 VITALS — BP 122/80 | HR 56 | Temp 98.2°F | Wt 166.0 lb

## 2012-02-22 DIAGNOSIS — H6980 Other specified disorders of Eustachian tube, unspecified ear: Secondary | ICD-10-CM

## 2012-02-22 DIAGNOSIS — G8929 Other chronic pain: Secondary | ICD-10-CM

## 2012-02-22 DIAGNOSIS — R51 Headache: Secondary | ICD-10-CM

## 2012-02-22 MED ORDER — MOMETASONE FUROATE 50 MCG/ACT NA SUSP: NASAL | Status: DC

## 2012-02-22 MED ORDER — TRAMADOL HCL 50 MG PO TABS: ORAL_TABLET | ORAL | Status: DC

## 2012-02-22 NOTE — Progress Notes (Signed)
  Subjective:    Patient ID: Beverly Elliott, female    DOB: 1970-10-24, 41 y.o.   MRN: [id number]  HPI Symptoms began approximately one week ago as aching headaches located in the temples with radiation to the auricular area bilaterally as well as to the forehead. This has been essentially constant; but it has been progressive. She's been taking ibuprofen 200 mg 3 pills twice a day. She also ingests approximately a liter of cola day.  This headache is unlike her typical migraines. They are described as tension in the neck with radiation superiorly to the posterior occiput. These resolved with Flexeril.  Additionally she's noted the left ear to be stopped up with decreased hearing X 1 year; but worse in past month after swimmig. She used an over-the-counter dewaxing agent; this did not result in improvement but was associated with a "sizzling sound" in her ear. 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    Review of Systems Nausea/vomiting: no Photophobia/phonophobia: only to loud sound Tearing of eyes: no  Sinus pain/pressure: no Family hx migraine: sister Relation to menstrual cycle: no  Red Flags Fever/chills: no Neck pain/stiffness: not with these headaches Vision/speech/swallow difficulty: unrelated Focal weakness/numbness: no Altered mental status: no Trauma: no  Anticoagulant use: no Pregnancy: PMH of TAH       Objective:   Physical Exam   Gen. appearance: Well-nourished, in no distress Eyes: Extraocular motion intact, field of vision normal, vision grossly intact, no nystagmus. Slight asymmetry of upper lids ENT: Canals clear, tympanic membranes normal,  hearing grossly normal. Air conduction is greater than bone conduction the tuning fork. There may be slight lateralization to the right. The light reflex on the left is sharper than the light reflex on the right tympanic membrane. There is absolutely no wax in the canal Neck: Normal range of motion, no masses.  Thyroid firm w/o enlargement Cardiovascular: Rate and rhythm normal; no murmurs, gallops or extra heart sounds Muscle skeletal: Tone, &  strength normal Neuro:no cranial nerve deficit, deep tendon  reflexes normal, gait normal. Romberg testing and finger to nose testing normal Lymph: No cervical or axillary LA Skin: Warm and dry without suspicious lesions or rashes Psych: no anxiety or mood change. Normally interactive and cooperative.         Assessment & Plan:  #1 chronic daily headaches; no neurologic deficit. Minimal abnormality tuning fork exam  #2 probable left eustachian tube dysfunction; no evidence of any wax impaction. Clinically the left tympanic membrane has a sharper light reflex than the right Plan: See orders and recommendations

## 2012-02-22 NOTE — Patient Instructions (Addendum)
Chronic daily headaches represent  a conversion of migraines into a headache which recurs repeatedly every day .These are almost always  due to the use of excess nonsteroidals such as ibuprofen or naproxen and or caffeine. The nonsteroidals and caffeine should be weaned as quickly as possible; but they should not be "cold [place]" going from a high dose to none. Nasonex 1 spray into L nostril twice a day as needed. Use the "crossover" technique as discussed

## 2012-02-22 NOTE — Telephone Encounter (Signed)
Pt treated by MD Alwyn Ren in office today in absence of MD Beverely Low

## 2012-02-22 NOTE — Telephone Encounter (Signed)
Called pt to offer apt at 4:30pm today with MD Alwyn Ren in absence of MD Tabori, pt accepted apt for today and was placed on the schedule

## 2012-02-22 NOTE — Telephone Encounter (Signed)
Caller: Aydin/Patient; Patient Name: Beverly Elliott; PCP: Sheliah Hatch.; Best Callback Phone Number: [phone]; Reason for call: Other; Currently has a migraine that has been present sine 02/17/12; Has tried Ibuprofen, flexeril  and it is not helping.  Describes pain as frontal with pressure in left ear.States this is different from her typical headache which is stress related involving neck tightness.   Face area is painful - rates pain as 7-8/10 scale with left ear involvement;  Denies cold symptoms. LMP-hysterectomy; Triaged using Headache with a disposition to be see within 4 hours due new onset of severe headache unrelieved with four hours of home care.  OFFICE:  TRIAGE FOR THIS PATIENT IS TO BE SEEN WITHIN FOUR HOURS.  ALTHOUGH HAS HAD SEEN 02/17/12 SHE DESCRIBES AS ATYPICAL PAIN AND AREA INVOLVED WITHOUT RESPONSE WITH USUAL TREATMENT. HAS BEEN CRYING WITH THE PAIN.  THANKS

## 2012-04-04 ENCOUNTER — Telehealth: Payer: Self-pay | Admitting: Family Medicine

## 2012-04-04 NOTE — Telephone Encounter (Signed)
Refill: Clonazepam 1 mg tablet. Take 1 tablet by mouth twice a day as needed for anxiety. Qty 40. Last fill 02-01-12

## 2012-04-04 NOTE — Telephone Encounter (Signed)
OV 02/22/12 last filled 11/24/11 # 30 x 3  PLz advise    MW

## 2012-04-05 MED ORDER — CLONAZEPAM 1 MG PO TABS: 1.0000 mg | ORAL_TABLET | Freq: Every day | ORAL | Status: DC

## 2012-04-05 NOTE — Telephone Encounter (Signed)
Ok to refill for 1 month only

## 2012-04-05 NOTE — Telephone Encounter (Signed)
Rx sent 

## 2012-04-12 ENCOUNTER — Telehealth: Payer: Self-pay | Admitting: Family Medicine

## 2012-04-12 MED ORDER — CITALOPRAM HYDROBROMIDE 40 MG PO TABS: 40.0000 mg | ORAL_TABLET | Freq: Every day | ORAL | Status: DC

## 2012-04-12 NOTE — Telephone Encounter (Signed)
Refill: Citalopram hbr 40 mg tablet. Take 1 tablet by mouth every day. Qty 30. Last fill 03-09-12

## 2012-05-16 ENCOUNTER — Telehealth: Payer: Self-pay | Admitting: Family Medicine

## 2012-05-16 NOTE — Telephone Encounter (Signed)
Refill: Clonazepam 1mg  tablet. Take 1 tablet by mouth at bedtime. Last fill 04-05-12

## 2012-05-16 NOTE — Telephone Encounter (Signed)
Please advise on RF request.//AB/CMA 

## 2012-05-16 NOTE — Telephone Encounter (Signed)
Ok for #30, 3 refills- needs to sign agreement and do UDS if not already done  

## 2012-05-17 MED ORDER — CLONAZEPAM 1 MG PO TABS: 1.0000 mg | ORAL_TABLET | Freq: Every day | ORAL | Status: DC

## 2012-05-17 NOTE — Telephone Encounter (Signed)
Spoke with the pt and informed her the RF request is ready and she will need to pick it up.  Also she will need to sign an agreement.  Pt agreed.//AB/CMA

## 2012-06-07 ENCOUNTER — Other Ambulatory Visit: Payer: Self-pay | Admitting: Family Medicine

## 2012-06-07 NOTE — Telephone Encounter (Signed)
pt called stated her back is out and she is in need of a refill on flexeril -- she has called the pharmacy but wanted dr.tabori to know so she could be sure the refill gets done today cb# [phone]

## 2012-06-08 MED ORDER — CYCLOBENZAPRINE HCL 5 MG PO TABS: 5.0000 mg | ORAL_TABLET | Freq: Three times a day (TID) | ORAL | Status: DC | PRN

## 2012-06-08 NOTE — Telephone Encounter (Signed)
Rx sent to the pharmacy by e-script.  LM with the pt's husband to let the pt know the rx was refilled.//AB/CMA

## 2012-06-08 NOTE — Telephone Encounter (Signed)
Please advise 

## 2012-06-08 NOTE — Telephone Encounter (Signed)
Ok for #60, 1 refill 

## 2012-06-16 ENCOUNTER — Telehealth: Payer: Self-pay | Admitting: Family Medicine

## 2012-06-16 DIAGNOSIS — F329 Major depressive disorder, single episode, unspecified: Secondary | ICD-10-CM

## 2012-06-16 DIAGNOSIS — F32A Depression, unspecified: Secondary | ICD-10-CM

## 2012-06-16 NOTE — Telephone Encounter (Signed)
Please advise on RF request.  Last OV:11-24-11.//AB/CMA

## 2012-06-16 NOTE — Telephone Encounter (Signed)
Refill: Citalopram hbr 40 mg tablet. 90 day supply

## 2012-06-18 NOTE — Telephone Encounter (Signed)
Ok for #90, 3 refills 

## 2012-06-19 MED ORDER — CITALOPRAM HYDROBROMIDE 40 MG PO TABS: 40.0000 mg | ORAL_TABLET | Freq: Every day | ORAL | Status: DC

## 2012-06-19 NOTE — Telephone Encounter (Signed)
Refill for citalopram #90 with 3 RF sent to [facility] on [address]

## 2012-09-27 ENCOUNTER — Other Ambulatory Visit: Payer: Self-pay | Admitting: Family Medicine

## 2012-09-28 NOTE — Telephone Encounter (Signed)
Last refill:08-26-12 Last OV:11-24-11-was to sch.CPE in 1-2 mos,but no appt sch'ed. Please advise.//AB/CMA

## 2012-09-29 NOTE — Telephone Encounter (Signed)
Rx printed and faxed to the pharmacy.//AB/CMA 

## 2012-09-29 NOTE — Telephone Encounter (Signed)
Ok for #30, no refills.  Needs appt

## 2012-10-04 ENCOUNTER — Ambulatory Visit (INDEPENDENT_AMBULATORY_CARE_PROVIDER_SITE_OTHER): Payer: BC Managed Care – PPO | Admitting: Family Medicine

## 2012-10-04 ENCOUNTER — Encounter: Payer: Self-pay | Admitting: Family Medicine

## 2012-10-04 VITALS — BP 120/92 | HR 81 | Temp 98.2°F | Ht 62.5 in | Wt 178.6 lb

## 2012-10-04 DIAGNOSIS — L919 Hypertrophic disorder of the skin, unspecified: Secondary | ICD-10-CM

## 2012-10-04 DIAGNOSIS — L909 Atrophic disorder of skin, unspecified: Secondary | ICD-10-CM

## 2012-10-04 DIAGNOSIS — L918 Other hypertrophic disorders of the skin: Secondary | ICD-10-CM

## 2012-10-04 DIAGNOSIS — F411 Generalized anxiety disorder: Secondary | ICD-10-CM

## 2012-10-04 MED ORDER — CLONAZEPAM 1 MG PO TABS: 1.0000 mg | ORAL_TABLET | Freq: Two times a day (BID) | ORAL | Status: DC | PRN

## 2012-10-04 NOTE — Assessment & Plan Note (Signed)
Deteriorated since death of mother.  Pt fears she will have difficulty getting through upcoming memorial service.  Increase Klonopin prn.  Pt to continue daily SSRI and counseling.  Will follow.

## 2012-10-04 NOTE — Patient Instructions (Addendum)
Schedule a physical at your convenience Increase the Klonopin as needed You look great! Hang in there!  Remember, you can't save everyone! Happy Early [place] Ouch!!!

## 2012-10-04 NOTE — Assessment & Plan Note (Signed)
New.  Benign appearing.  Reassurance provided.  Will consider removal in future if area becomes irritated or painful.

## 2012-10-04 NOTE — Progress Notes (Signed)
  Subjective:    Patient ID: Beverly Elliott, female    DOB: 29-Jul-1970, 42 y.o.   MRN: [id number]  HPI Depression/anxiety- pt reports anxiety sxs have worsened recently due to mom's death 8 days ago.  Denies depression currently, feels Celexa is working well.  Currently in therapy.  Would like to increase Klonopin to 1/2 tab daily and full tab nightly.  Mole- R shoulder.  Pt unable to see area, reports it has 'doubled in size' in the last year.  No drainage, no pain, no scabbing or bleeding.   Review of Systems For ROS see HPI     Objective:   Physical Exam  Vitals reviewed. Constitutional: She is oriented to person, place, and time. She appears well-developed and well-nourished. No distress.  HENT:  Head: Normocephalic and atraumatic.  Cardiovascular: Normal rate, regular rhythm, normal heart sounds and intact distal pulses.   Pulmonary/Chest: Effort normal and breath sounds normal. No respiratory distress. She has no wheezes. She has no rales.  Neurological: She is alert and oriented to person, place, and time.  Skin:  Large, benign skin tag on R posterior shoulder  Psychiatric: She has a normal mood and affect. Her behavior is normal. Judgment and thought content normal.          Assessment & Plan:

## 2012-10-18 ENCOUNTER — Encounter: Payer: Self-pay | Admitting: Family Medicine

## 2013-01-19 ENCOUNTER — Encounter: Payer: Self-pay | Admitting: Family Medicine

## 2013-01-19 ENCOUNTER — Ambulatory Visit (INDEPENDENT_AMBULATORY_CARE_PROVIDER_SITE_OTHER): Payer: BC Managed Care – PPO | Admitting: Family Medicine

## 2013-01-19 VITALS — BP 118/96 | HR 86 | Temp 98.5°F | Wt 194.2 lb

## 2013-01-19 DIAGNOSIS — J45901 Unspecified asthma with (acute) exacerbation: Secondary | ICD-10-CM | POA: Insufficient documentation

## 2013-01-19 MED ORDER — AZITHROMYCIN 250 MG PO TABS: ORAL_TABLET | ORAL | Status: DC

## 2013-01-19 MED ORDER — NYSTATIN 100000 UNIT/GM EX CREA: TOPICAL_CREAM | Freq: Two times a day (BID) | CUTANEOUS | Status: DC

## 2013-01-19 MED ORDER — ALBUTEROL SULFATE (2.5 MG/3ML) 0.083% IN NEBU
2.5000 mg | INHALATION_SOLUTION | Freq: Once | RESPIRATORY_TRACT | Status: AC
  Administered 2013-01-19: 2.5 mg via RESPIRATORY_TRACT

## 2013-01-19 MED ORDER — PROMETHAZINE-DM 6.25-15 MG/5ML PO SYRP: 5.0000 mL | ORAL_SOLUTION | Freq: Four times a day (QID) | ORAL | Status: DC | PRN

## 2013-01-19 MED ORDER — ALBUTEROL SULFATE HFA 108 (90 BASE) MCG/ACT IN AERS: 1.0000 | INHALATION_SPRAY | Freq: Four times a day (QID) | RESPIRATORY_TRACT | Status: DC | PRN

## 2013-01-19 NOTE — Addendum Note (Signed)
Addended by: Jackson Latino on: 01/19/2013 02:19 PM   Modules accepted: Orders

## 2013-01-19 NOTE — Assessment & Plan Note (Signed)
New.  Pt's wheezing dramatically improved s/p neb.  Start zpack.  Albuterol HFA prn.  Cough meds prn.  Reviewed supportive care and red flags that should prompt return.  Pt expressed understanding and is in agreement w/ plan.

## 2013-01-19 NOTE — Progress Notes (Signed)
  Subjective:    Patient ID: Beverly Elliott, female    DOB: 01-May-1971, 42 y.o.   MRN: [id number]  HPI URI- sxs started 9/10.  Now w/ extreme SOB.  Is out of inhaler.  + nasal congestion, maxillary sinus pain.  'my head is just ringing'.  No N/V/D, no fever.  + sick contacts.  + wheezing.   Review of Systems For ROS see HPI     Objective:   Physical Exam  Vitals reviewed. Constitutional: She appears well-developed and well-nourished.  Obviously SOB  HENT:  Head: Normocephalic and atraumatic.  Mouth/Throat: Oropharynx is clear and moist. No oropharyngeal exudate.  No TTP over sinuses R TM WNL L TM retracted w/ clear fluid present  Neck: Normal range of motion. Neck supple.  Cardiovascular: Normal rate, regular rhythm and normal heart sounds.   Pulmonary/Chest: She is in respiratory distress (increased WOB). She has wheezes (diffuse end expiratory wheezes). She has no rales.  Lymphadenopathy:    She has no cervical adenopathy.          Assessment & Plan:

## 2013-01-19 NOTE — Patient Instructions (Addendum)
Start the Zpack for the bronchitis Use the cough syrup as needed- will cause drowsiness Drink plenty of fluids REST! Use the inhaler as needed Call with any questions or concerns Hang in there!

## 2013-01-24 ENCOUNTER — Ambulatory Visit (HOSPITAL_BASED_OUTPATIENT_CLINIC_OR_DEPARTMENT_OTHER)
Admission: RE | Admit: 2013-01-24 | Discharge: 2013-01-24 | Disposition: A | Discharge status: Home / Self Care | Payer: BC Managed Care – PPO | Source: Ambulatory Visit | Attending: Family Medicine | Admitting: Family Medicine

## 2013-01-24 ENCOUNTER — Telehealth: Payer: Self-pay | Admitting: Family Medicine

## 2013-01-24 ENCOUNTER — Encounter (HOSPITAL_BASED_OUTPATIENT_CLINIC_OR_DEPARTMENT_OTHER): Payer: Self-pay | Admitting: *Deleted

## 2013-01-24 ENCOUNTER — Emergency Department (HOSPITAL_BASED_OUTPATIENT_CLINIC_OR_DEPARTMENT_OTHER)
Admission: EM | Admit: 2013-01-24 | Discharge: 2013-01-24 | Disposition: A | Discharge status: Home / Self Care | Payer: BC Managed Care – PPO | Attending: Emergency Medicine | Admitting: Emergency Medicine

## 2013-01-24 ENCOUNTER — Other Ambulatory Visit (HOSPITAL_BASED_OUTPATIENT_CLINIC_OR_DEPARTMENT_OTHER): Payer: BC Managed Care – PPO

## 2013-01-24 DIAGNOSIS — J45901 Unspecified asthma with (acute) exacerbation: Secondary | ICD-10-CM

## 2013-01-24 DIAGNOSIS — F17200 Nicotine dependence, unspecified, uncomplicated: Secondary | ICD-10-CM | POA: Insufficient documentation

## 2013-01-24 DIAGNOSIS — Z8719 Personal history of other diseases of the digestive system: Secondary | ICD-10-CM | POA: Insufficient documentation

## 2013-01-24 DIAGNOSIS — Z87440 Personal history of urinary (tract) infections: Secondary | ICD-10-CM | POA: Insufficient documentation

## 2013-01-24 DIAGNOSIS — J45909 Unspecified asthma, uncomplicated: Secondary | ICD-10-CM | POA: Insufficient documentation

## 2013-01-24 DIAGNOSIS — F3289 Other specified depressive episodes: Secondary | ICD-10-CM | POA: Insufficient documentation

## 2013-01-24 DIAGNOSIS — F329 Major depressive disorder, single episode, unspecified: Secondary | ICD-10-CM | POA: Insufficient documentation

## 2013-01-24 DIAGNOSIS — Z862 Personal history of diseases of the blood and blood-forming organs and certain disorders involving the immune mechanism: Secondary | ICD-10-CM | POA: Insufficient documentation

## 2013-01-24 DIAGNOSIS — Z87442 Personal history of urinary calculi: Secondary | ICD-10-CM | POA: Insufficient documentation

## 2013-01-24 DIAGNOSIS — Z79899 Other long term (current) drug therapy: Secondary | ICD-10-CM | POA: Insufficient documentation

## 2013-01-24 DIAGNOSIS — G43909 Migraine, unspecified, not intractable, without status migrainosus: Secondary | ICD-10-CM | POA: Insufficient documentation

## 2013-01-24 DIAGNOSIS — Z8619 Personal history of other infectious and parasitic diseases: Secondary | ICD-10-CM | POA: Insufficient documentation

## 2013-01-24 IMAGING — CR DG CHEST 2V
2 series · 2 of 2 positions shown · non-contrast
Comparison: [DATE]

CLINICAL DATA: Asthmatic bronchitis

EXAM:
CHEST  2 VIEW

[w chest pa]
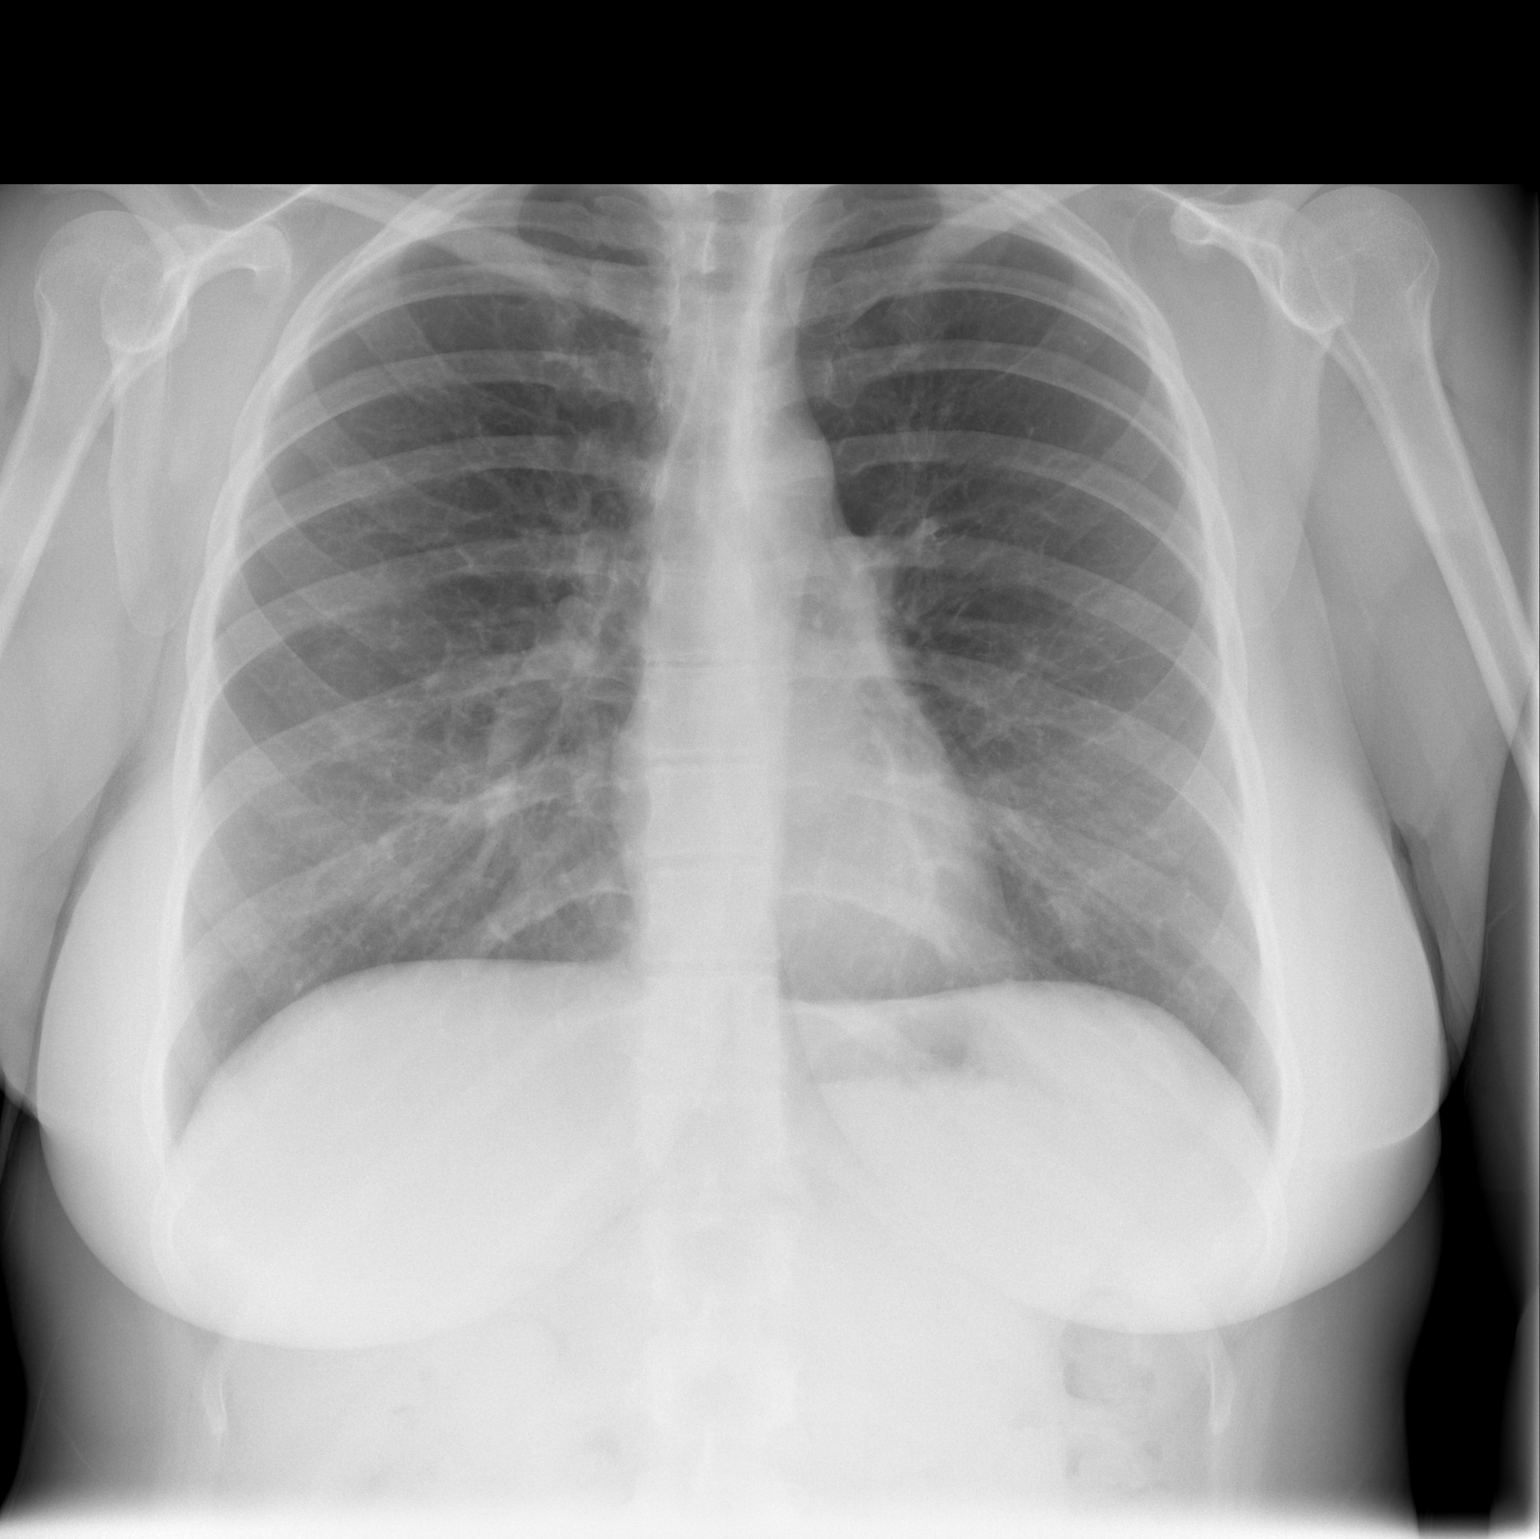

[w chest lat]
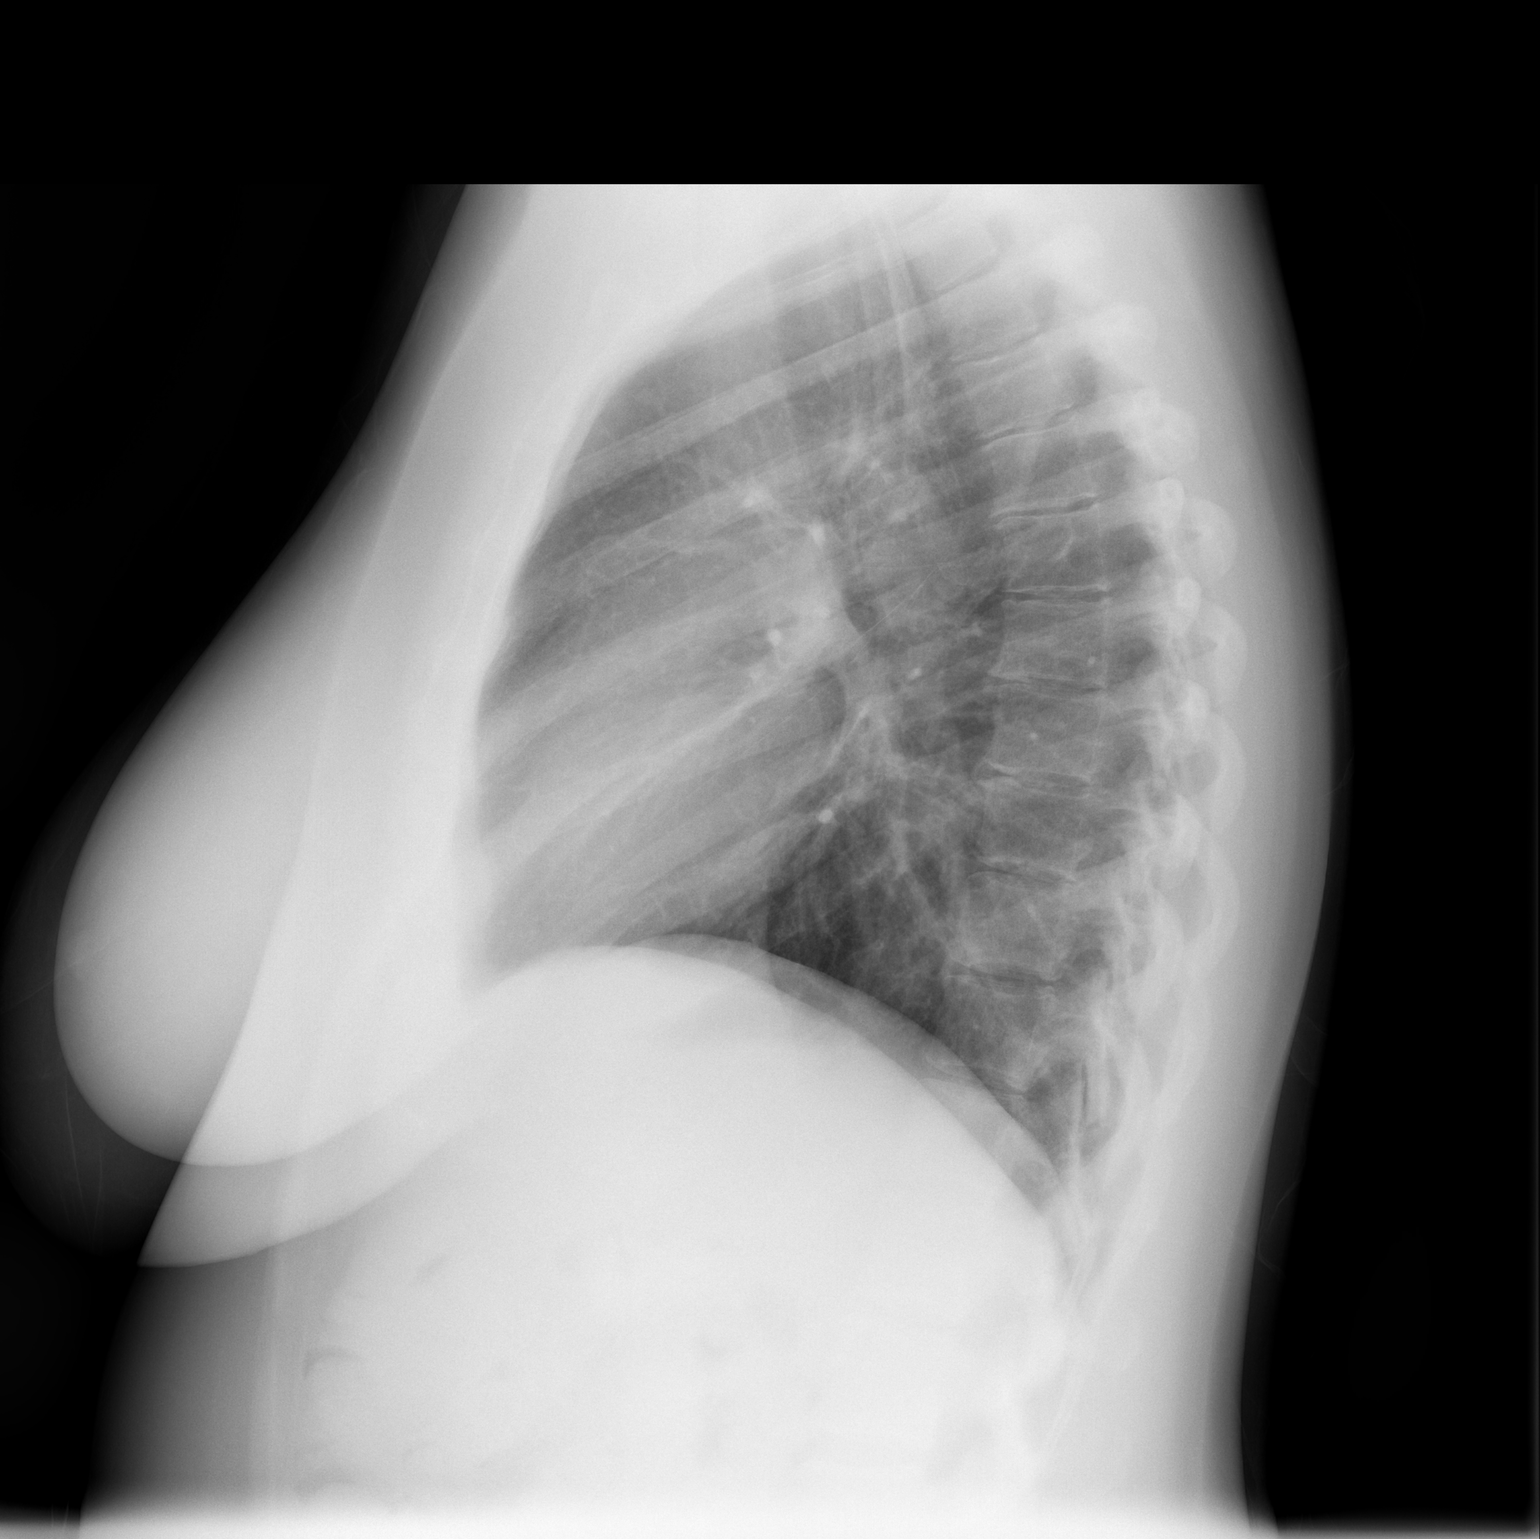

[2 of 2 positions shown; findings below may reference images not displayed]

FINDINGS: Lungs are clear. Heart size and pulmonary vascularity are normal. No
adenopathy. No bone lesions.
IMPRESSION: No abnormality noted.

## 2013-01-24 MED ORDER — ALBUTEROL SULFATE (5 MG/ML) 0.5% IN NEBU
INHALATION_SOLUTION | RESPIRATORY_TRACT | Status: AC
  Filled 2013-01-24: qty 1

## 2013-01-24 MED ORDER — AZITHROMYCIN 250 MG PO TABS: ORAL_TABLET | ORAL | Status: DC

## 2013-01-24 MED ORDER — ALBUTEROL SULFATE (5 MG/ML) 0.5% IN NEBU
5.0000 mg | INHALATION_SOLUTION | Freq: Once | RESPIRATORY_TRACT | Status: AC
  Administered 2013-01-24: 5 mg via RESPIRATORY_TRACT

## 2013-01-24 MED ORDER — IPRATROPIUM BROMIDE 0.02 % IN SOLN
0.5000 mg | Freq: Once | RESPIRATORY_TRACT | Status: AC
  Administered 2013-01-24: 0.5 mg via RESPIRATORY_TRACT

## 2013-01-24 MED ORDER — IPRATROPIUM BROMIDE 0.02 % IN SOLN
RESPIRATORY_TRACT | Status: AC
  Filled 2013-01-24: qty 2.5

## 2013-01-24 NOTE — ED Provider Notes (Signed)
CSN: [id number]     Arrival date & time 01/24/13  1724 History   First MD Initiated Contact with Patient 01/24/13 1743     Chief Complaint  Patient presents with  . Shortness of Breath   (Consider location/radiation/quality/duration/timing/severity/associated sxs/prior Treatment) Patient is a 42 y.o. female presenting with shortness of breath. The history is provided by the patient. No language interpreter was used.  Shortness of Breath Severity:  Moderate Associated symptoms: cough   Associated symptoms: no abdominal pain, no chest pain, no fever, no rash and no vomiting   Associated symptoms comment:  Complains of wheezing, shortness of breath and cough starting last week. No fever. She has a history of asthma on inhalers at home, and just finished a Z-Pack yesterday after a diagnosis of bronchitis. She was seen at her MD's office and sent here for x-ray of chest.    Past Medical History  Diagnosis Date  . Anemia   . Depression   . GERD (gastroesophageal reflux disease)   . History of chicken pox   . UTI (lower urinary tract infection)   . Migraine   . Kidney stones   . IBS (irritable bowel syndrome)    Past Surgical History  Procedure Laterality Date  . Abdominal hysterectomy    . Tonsillectomy    . Cesarean section     Family History  Problem Relation Age of Onset  . Coronary artery disease Mother   . Coronary artery disease Father   . Heart attack Father   . Hypertension Mother   . Hypertension Paternal Grandmother   . Diabetes Maternal Grandfather    History  Substance Use Topics  . Smoking status: Current Every Day Smoker -- 0.80 packs/day    Types: Cigarettes  . Smokeless tobacco: Not on file  . Alcohol Use: Yes   OB History   Grav Para Term Preterm Abortions TAB SAB Ect Mult Living                 Review of Systems  Constitutional: Negative for fever.  HENT: Positive for rhinorrhea.   Respiratory: Positive for cough, chest tightness and shortness of  breath.   Cardiovascular: Negative for chest pain.  Gastrointestinal: Negative for vomiting and abdominal pain.  Musculoskeletal: Negative for myalgias.  Skin: Negative for rash.    Allergies  Metoclopramide hcl and Tape  Home Medications   Current Outpatient Rx  Name  Route  Sig  Dispense  Refill  . albuterol (PROVENTIL HFA;VENTOLIN HFA) 108 (90 BASE) MCG/ACT inhaler   Inhalation   Inhale 1-2 puffs into the lungs every 6 (six) hours as needed for wheezing.   1 Inhaler   6   . azithromycin (ZITHROMAX) 250 MG tablet      2 tabs on day 1, 1 tab on day 2-5   6 tablet   0   . citalopram (CELEXA) 40 MG tablet   Oral   Take 1 tablet (40 mg total) by mouth daily.   90 tablet   3   . clonazePAM (KLONOPIN) 1 MG tablet   Oral   Take 1 tablet (1 mg total) by mouth 2 (two) times daily as needed for anxiety.   60 tablet   1     Need to schedule an OV.   . cyclobenzaprine (FLEXERIL) 5 MG tablet   Oral   Take 1 tablet (5 mg total) by mouth 3 (three) times daily as needed. May cause drowsiness.   60 tablet   1   .  nystatin cream (MYCOSTATIN)   Topical   Apply topically 2 (two) times daily.   30 g   3   . promethazine-dextromethorphan (PROMETHAZINE-DM) 6.25-15 MG/5ML syrup   Oral   Take 5 mLs by mouth 4 (four) times daily as needed.   240 mL   0   . dextromethorphan-guaiFENesin (MUCINEX DM) 30-600 MG per 12 hr tablet   Oral   Take 1 tablet by mouth every 12 (twelve) hours. Patient used this medication for congestion.         . mometasone (NASONEX) 50 MCG/ACT nasal spray      1 spray into L nostril twice a day as needed. Use the "crossover" technique as discussed   17 g   12   . promethazine (PHENERGAN) 25 MG tablet   Oral   Take 25 mg by mouth every 6 (six) hours as needed.         . traMADol (ULTRAM) 50 MG tablet      1/2-1 every 6-8 hrs prn   30 tablet   0    BP 133/92  Pulse 71  Temp(Src) 97.4 F (36.3 C) (Oral)  Resp 28  SpO2 100% Physical  Exam  Constitutional: She is oriented to person, place, and time. She appears well-developed and well-nourished.  HENT:  Head: Normocephalic.  Neck: Normal range of motion. Neck supple.  Cardiovascular: Normal rate and regular rhythm.   Pulmonary/Chest: Effort normal and breath sounds normal.  Non-labored breathing with expiratory wheezing.  No accessory muscle use.   Abdominal: Soft. Bowel sounds are normal. There is no tenderness. There is no rebound and no guarding.  Musculoskeletal: Normal range of motion. She exhibits no edema.  Neurological: She is alert and oriented to person, place, and time.  Skin: Skin is warm and dry. No rash noted.  Psychiatric: She has a normal mood and affect.    ED Course  Procedures (including critical care time) Labs Review Labs Reviewed - No data to display Imaging Review No results found.  MDM  No diagnosis found. 1. Asthma, acute exacerbation   Patient stable, improved with nebulizer treatment, decreased wheezing and work of breathing. CXR neg. Stable for discharge.     Arnoldo Hooker, PA-C 01/31/13 1414

## 2013-01-24 NOTE — ED Notes (Signed)
Pt finished zithromax yesterday, no relief of sxs.

## 2013-01-24 NOTE — ED Notes (Signed)
Pt sent over for outpt chest xr by PMD. C/O wheezing, cough, and shortness of breath.

## 2013-01-24 NOTE — Telephone Encounter (Signed)
Med filled, orders placed, and pt made aware of both.

## 2013-01-24 NOTE — Telephone Encounter (Signed)
Repeat Zpack Needs CXR- 2 view, [facility]

## 2013-01-24 NOTE — ED Provider Notes (Signed)
CSN: [id number]     Arrival date & time 01/24/13  1724 History   First MD Initiated Contact with Patient 01/24/13 1743     Chief Complaint  Patient presents with  . Shortness of Breath   (Consider location/radiation/quality/duration/timing/severity/associated sxs/prior Treatment) HPI  Past Medical History  Diagnosis Date  . Anemia   . Depression   . GERD (gastroesophageal reflux disease)   . History of chicken pox   . UTI (lower urinary tract infection)   . Migraine   . Kidney stones   . IBS (irritable bowel syndrome)    Past Surgical History  Procedure Laterality Date  . Abdominal hysterectomy    . Tonsillectomy    . Cesarean section     Family History  Problem Relation Age of Onset  . Coronary artery disease Mother   . Coronary artery disease Father   . Heart attack Father   . Hypertension Mother   . Hypertension Paternal Grandmother   . Diabetes Maternal Grandfather    History  Substance Use Topics  . Smoking status: Current Every Day Smoker -- 0.80 packs/day    Types: Cigarettes  . Smokeless tobacco: Not on file  . Alcohol Use: Yes   OB History   Grav Para Term Preterm Abortions TAB SAB Ect Mult Living                 Review of Systems  Allergies  Metoclopramide hcl and Tape  Home Medications   Current Outpatient Rx  Name  Route  Sig  Dispense  Refill  . albuterol (PROVENTIL HFA;VENTOLIN HFA) 108 (90 BASE) MCG/ACT inhaler   Inhalation   Inhale 1-2 puffs into the lungs every 6 (six) hours as needed for wheezing.   1 Inhaler   6   . azithromycin (ZITHROMAX) 250 MG tablet      2 tabs on day 1, 1 tab on day 2-5   6 tablet   0   . citalopram (CELEXA) 40 MG tablet   Oral   Take 1 tablet (40 mg total) by mouth daily.   90 tablet   3   . clonazePAM (KLONOPIN) 1 MG tablet   Oral   Take 1 tablet (1 mg total) by mouth 2 (two) times daily as needed for anxiety.   60 tablet   1     Need to schedule an OV.   . cyclobenzaprine (FLEXERIL) 5 MG  tablet   Oral   Take 1 tablet (5 mg total) by mouth 3 (three) times daily as needed. May cause drowsiness.   60 tablet   1   . nystatin cream (MYCOSTATIN)   Topical   Apply topically 2 (two) times daily.   30 g   3   . promethazine-dextromethorphan (PROMETHAZINE-DM) 6.25-15 MG/5ML syrup   Oral   Take 5 mLs by mouth 4 (four) times daily as needed.   240 mL   0   . dextromethorphan-guaiFENesin (MUCINEX DM) 30-600 MG per 12 hr tablet   Oral   Take 1 tablet by mouth every 12 (twelve) hours. Patient used this medication for congestion.         . mometasone (NASONEX) 50 MCG/ACT nasal spray      1 spray into L nostril twice a day as needed. Use the "crossover" technique as discussed   17 g   12   . promethazine (PHENERGAN) 25 MG tablet   Oral   Take 25 mg by mouth every 6 (six) hours as needed.         Marland Kitchen  traMADol (ULTRAM) 50 MG tablet      1/2-1 every 6-8 hrs prn   30 tablet   0    BP 133/92  Pulse 71  Temp(Src) 97.4 F (36.3 C) (Oral)  Resp 28  SpO2 100% Physical Exam  ED Course  Procedures (including critical care time) Labs Review Labs Reviewed - No data to display Imaging Review Dg Chest 2 View  01/24/2013   CLINICAL DATA:  Asthmatic bronchitis  EXAM: CHEST  2 VIEW  COMPARISON:  July 08, 2011  FINDINGS: Lungs are clear. Heart size and pulmonary vascularity are normal. No adenopathy. No bone lesions.  IMPRESSION: No abnormality noted.   Electronically Signed   By: Bretta Bang   On: 01/24/2013 19:10    MDM  Bronchitis  Please see chart by S. Upstill for HPI, ROS, PE - reports has zithromax, and inhaler - no shortness of breath at this time.  Oxygen sats 100%.  No longer with tachypnea.   Izola Price Marisue Humble, PA-C 01/24/13 2013

## 2013-01-24 NOTE — Telephone Encounter (Signed)
Please advise 

## 2013-01-24 NOTE — Telephone Encounter (Signed)
Patient is calling because her symptoms that she came in for last week, 01/19/13, have not gone away. Says that "it feel as if one side of my lung is clear and the other one is not." Wants to know what she should do. Please advise.

## 2013-01-25 NOTE — ED Provider Notes (Signed)
History/physical exam/procedure(s) were performed by non-physician practitioner and as supervising physician I was immediately available for consultation/collaboration. I have reviewed all notes and am in agreement with care and plan.   Jya Hughston S Shoichi Mielke, MD 01/25/13 0004 

## 2013-02-01 NOTE — ED Provider Notes (Signed)
History/physical exam/procedure(s) were performed by non-physician practitioner and as supervising physician I was immediately available for consultation/collaboration. I have reviewed all notes and am in agreement with care and plan.   Hilario Quarry, MD 02/01/13 [phone]

## 2013-02-17 ENCOUNTER — Other Ambulatory Visit: Payer: Self-pay | Admitting: Family Medicine

## 2013-02-19 NOTE — Telephone Encounter (Signed)
Last visit-01/19/2013  Last filled-10/04/2012 #60, 1 refill  UDS-10/04/2012-mod risk, contract signed   Please advise. SW

## 2013-06-29 ENCOUNTER — Other Ambulatory Visit: Payer: Self-pay | Admitting: Family Medicine

## 2013-07-02 NOTE — Telephone Encounter (Signed)
Last OV 01-19-13 Clonazepam last filled 02-17-13 #60 with 1 Citalopram last filled 04-12-12 #30 with 3   Moderate Risk, [facility]C

## 2013-07-02 NOTE — Telephone Encounter (Signed)
Pt notified. Med filled.

## 2013-08-01 ENCOUNTER — Telehealth: Payer: Self-pay | Admitting: Family Medicine

## 2013-08-01 ENCOUNTER — Ambulatory Visit (INDEPENDENT_AMBULATORY_CARE_PROVIDER_SITE_OTHER): Payer: BC Managed Care – PPO | Admitting: Family Medicine

## 2013-08-01 ENCOUNTER — Encounter: Payer: Self-pay | Admitting: Family Medicine

## 2013-08-01 VITALS — BP 120/86 | HR 78 | Temp 98.4°F | Resp 16 | Wt 195.5 lb

## 2013-08-01 DIAGNOSIS — J0100 Acute maxillary sinusitis, unspecified: Secondary | ICD-10-CM | POA: Insufficient documentation

## 2013-08-01 DIAGNOSIS — J45901 Unspecified asthma with (acute) exacerbation: Secondary | ICD-10-CM

## 2013-08-01 DIAGNOSIS — H6980 Other specified disorders of Eustachian tube, unspecified ear: Secondary | ICD-10-CM

## 2013-08-01 DIAGNOSIS — R062 Wheezing: Secondary | ICD-10-CM

## 2013-08-01 MED ORDER — IPRATROPIUM-ALBUTEROL 0.5-2.5 (3) MG/3ML IN SOLN
3.0000 mL | Freq: Once | RESPIRATORY_TRACT | Status: AC
  Administered 2013-08-01: 3 mL via RESPIRATORY_TRACT

## 2013-08-01 MED ORDER — MOMETASONE FUROATE 50 MCG/ACT NA SUSP: NASAL | Status: DC

## 2013-08-01 MED ORDER — PROMETHAZINE-DM 6.25-15 MG/5ML PO SYRP: 5.0000 mL | ORAL_SOLUTION | Freq: Four times a day (QID) | ORAL | Status: DC | PRN

## 2013-08-01 MED ORDER — AMOXICILLIN 875 MG PO TABS
875.0000 mg | ORAL_TABLET | Freq: Two times a day (BID) | ORAL | Status: DC
Start: 2013-08-01 — End: 2013-09-20

## 2013-08-01 MED ORDER — ALBUTEROL SULFATE (2.5 MG/3ML) 0.083% IN NEBU: 2.5000 mg | INHALATION_SOLUTION | Freq: Four times a day (QID) | RESPIRATORY_TRACT | Status: DC | PRN

## 2013-08-01 NOTE — Assessment & Plan Note (Signed)
New.  Pt's sxs and PE consistent w/ infxn.  Start abx.  Cough meds prn.  Reviewed supportive care and red flags that should prompt return.  Pt expressed understanding and is in agreement w/ plan.

## 2013-08-01 NOTE — Progress Notes (Signed)
   Subjective:    Patient ID: Beverly Elliott, female    DOB: 07-15-1970, 43 y.o.   MRN: [id number]  Cough   URI- sxs started 3 days ago.  + head and nasal congestion, deep hacking cough w/ urinary leaking.  No fevers.  + wheezing and SOB.  + maxillary pain.  L ear pressure.  No N//V/D.  + sick contacts.   Review of Systems  Respiratory: Positive for cough.    For ROS see HPI     Objective:   Physical Exam  Vitals reviewed. Constitutional: She is oriented to person, place, and time. She appears well-developed and well-nourished. No distress.  HENT:  Head: Normocephalic and atraumatic.  Right Ear: Tympanic membrane normal.  Left Ear: Tympanic membrane normal.  Nose: Mucosal edema and rhinorrhea present. Right sinus exhibits maxillary sinus tenderness and frontal sinus tenderness. Left sinus exhibits maxillary sinus tenderness and frontal sinus tenderness.  Mouth/Throat: Uvula is midline and mucous membranes are normal. Posterior oropharyngeal erythema present. No oropharyngeal exudate.  Eyes: Conjunctivae and EOM are normal. Pupils are equal, round, and reactive to light.  Neck: Normal range of motion. Neck supple.  Cardiovascular: Normal rate, regular rhythm and normal heart sounds.   Pulmonary/Chest: Effort normal. No respiratory distress. She has wheezes (scattered end expiratory wheezes).  Lymphadenopathy:    She has no cervical adenopathy.  Neurological: She is alert and oriented to person, place, and time.  Skin: Skin is warm and dry.          Assessment & Plan:

## 2013-08-01 NOTE — Progress Notes (Signed)
Pre visit review using our clinic review tool, if applicable. No additional management support is needed unless otherwise documented below in the visit note. 

## 2013-08-01 NOTE — Patient Instructions (Signed)
Schedule your physical at your convenience Start the Amoxicillin twice daily- take w/ food Drink plenty of fluids Use the cough syrup as needed Continue the Albuterol 2 puffs (or neb) every 4 hrs as needed for cough, shortness of breath Call with any questions or concerns Hang in there!!!

## 2013-08-01 NOTE — Assessment & Plan Note (Signed)
Recurrent problem for pt.  sxs improved s/p neb tx.  Refill provided on nebules for home use.  Cough meds prn.  Abx.  Will follow.

## 2013-08-01 NOTE — Telephone Encounter (Signed)
Relevant patient education mailed to patient.  

## 2013-08-08 ENCOUNTER — Telehealth: Payer: Self-pay | Admitting: Family Medicine

## 2013-08-08 NOTE — Telephone Encounter (Signed)
Patient called and stated that the amoxicillin that dr Etter Sjogren prescribed for her is causing her to have a yeast infection. She would like something called in for her yeast infection. Please advise  Pharmacy  [id number] - [place], [place] - [address].

## 2013-08-09 MED ORDER — FLUCONAZOLE 150 MG PO TABS: 150.0000 mg | ORAL_TABLET | Freq: Once | ORAL | Status: DC

## 2013-08-09 NOTE — Telephone Encounter (Signed)
Diflucan 150 sent to the pharmacy.     KP

## 2013-09-03 ENCOUNTER — Encounter (HOSPITAL_BASED_OUTPATIENT_CLINIC_OR_DEPARTMENT_OTHER): Payer: Self-pay | Admitting: Emergency Medicine

## 2013-09-03 ENCOUNTER — Emergency Department (HOSPITAL_BASED_OUTPATIENT_CLINIC_OR_DEPARTMENT_OTHER)
Admission: EM | Admit: 2013-09-03 | Discharge: 2013-09-03 | Disposition: A | Discharge status: Home / Self Care | Payer: BC Managed Care – PPO | Attending: Emergency Medicine | Admitting: Emergency Medicine

## 2013-09-03 DIAGNOSIS — Z862 Personal history of diseases of the blood and blood-forming organs and certain disorders involving the immune mechanism: Secondary | ICD-10-CM | POA: Insufficient documentation

## 2013-09-03 DIAGNOSIS — Z3202 Encounter for pregnancy test, result negative: Secondary | ICD-10-CM | POA: Insufficient documentation

## 2013-09-03 DIAGNOSIS — Z87442 Personal history of urinary calculi: Secondary | ICD-10-CM | POA: Insufficient documentation

## 2013-09-03 DIAGNOSIS — R1013 Epigastric pain: Secondary | ICD-10-CM | POA: Insufficient documentation

## 2013-09-03 DIAGNOSIS — Z87440 Personal history of urinary (tract) infections: Secondary | ICD-10-CM | POA: Insufficient documentation

## 2013-09-03 DIAGNOSIS — G43909 Migraine, unspecified, not intractable, without status migrainosus: Secondary | ICD-10-CM | POA: Insufficient documentation

## 2013-09-03 DIAGNOSIS — R1110 Vomiting, unspecified: Secondary | ICD-10-CM | POA: Insufficient documentation

## 2013-09-03 DIAGNOSIS — Z79899 Other long term (current) drug therapy: Secondary | ICD-10-CM | POA: Insufficient documentation

## 2013-09-03 DIAGNOSIS — Z90710 Acquired absence of both cervix and uterus: Secondary | ICD-10-CM | POA: Insufficient documentation

## 2013-09-03 DIAGNOSIS — Z792 Long term (current) use of antibiotics: Secondary | ICD-10-CM | POA: Insufficient documentation

## 2013-09-03 DIAGNOSIS — Z9889 Other specified postprocedural states: Secondary | ICD-10-CM | POA: Insufficient documentation

## 2013-09-03 DIAGNOSIS — Z8619 Personal history of other infectious and parasitic diseases: Secondary | ICD-10-CM | POA: Insufficient documentation

## 2013-09-03 DIAGNOSIS — F17200 Nicotine dependence, unspecified, uncomplicated: Secondary | ICD-10-CM | POA: Insufficient documentation

## 2013-09-03 DIAGNOSIS — Z8719 Personal history of other diseases of the digestive system: Secondary | ICD-10-CM | POA: Insufficient documentation

## 2013-09-03 LAB — COMPREHENSIVE METABOLIC PANEL
ALT: 11 U/L (ref 0–35)
AST: 14 U/L (ref 0–37)
Albumin: 4 g/dL (ref 3.5–5.2)
Alkaline Phosphatase: 55 U/L (ref 39–117)
BUN: 12 mg/dL (ref 6–23)
CALCIUM: 9.3 mg/dL (ref 8.4–10.5)
CO2: 28 meq/L (ref 19–32)
Chloride: 101 mEq/L (ref 96–112)
Creatinine, Ser: 0.8 mg/dL (ref 0.50–1.10)
GFR calc Af Amer: >90 mL/min (ref >90)
GFR calc non Af Amer: >90 mL/min (ref >90)
Glucose, Bld: 147 mg/dL — ABNORMAL HIGH (ref 70–99)
POTASSIUM: 3.5 meq/L — AB (ref 3.7–5.3)
SODIUM: 143 meq/L (ref 137–147)
Total Bilirubin: <0.2 mg/dL — ABNORMAL LOW (ref 0.3–1.2)
Total Protein: 7.1 g/dL (ref 6.0–8.3)

## 2013-09-03 LAB — URINALYSIS, ROUTINE W REFLEX MICROSCOPIC
BILIRUBIN URINE: NEGATIVE
GLUCOSE, UA: NEGATIVE mg/dL
Hgb urine dipstick: NEGATIVE
Ketones, ur: 15 mg/dL — AB
Leukocytes, UA: NEGATIVE
Nitrite: NEGATIVE
PH: 8 (ref 5.0–8.0)
Protein, ur: NEGATIVE mg/dL
SPECIFIC GRAVITY, URINE: 1.022 (ref 1.005–1.030)
Urobilinogen, UA: 0.2 mg/dL (ref 0.0–1.0)

## 2013-09-03 LAB — URINE MICROSCOPIC-ADD ON

## 2013-09-03 LAB — CBC WITH DIFFERENTIAL/PLATELET
Basophils Absolute: 0 10*3/uL (ref 0.0–0.1)
Basophils Relative: 0 % (ref 0–1)
Eosinophils Absolute: 0 10*3/uL (ref 0.0–0.7)
Eosinophils Relative: 0 % (ref 0–5)
HCT: 40.5 % (ref 36.0–46.0)
Hemoglobin: 13.9 g/dL (ref 12.0–15.0)
Lymphocytes Relative: 11 % — ABNORMAL LOW (ref 12–46)
Lymphs Abs: 1.2 10*3/uL (ref 0.7–4.0)
MCH: 32.5 pg (ref 26.0–34.0)
MCHC: 34.3 g/dL (ref 30.0–36.0)
MCV: 94.6 fL (ref 78.0–100.0)
Monocytes Absolute: 0.3 10*3/uL (ref 0.1–1.0)
Monocytes Relative: 2 % — ABNORMAL LOW (ref 3–12)
Neutro Abs: 9.4 10*3/uL — ABNORMAL HIGH (ref 1.7–7.7)
Neutrophils Relative %: 86 % — ABNORMAL HIGH (ref 43–77)
Platelets: 228 10*3/uL (ref 150–400)
RBC: 4.28 MIL/uL (ref 3.87–5.11)
RDW: 12.7 % (ref 11.5–15.5)
WBC: 10.9 10*3/uL — ABNORMAL HIGH (ref 4.0–10.5)

## 2013-09-03 LAB — PREGNANCY, URINE: Preg Test, Ur: NEGATIVE

## 2013-09-03 LAB — LIPASE, BLOOD: LIPASE: 17 U/L (ref 11–59)

## 2013-09-03 MED ORDER — ONDANSETRON 4 MG PO TBDP: 4.0000 mg | ORAL_TABLET | Freq: Three times a day (TID) | ORAL | Status: DC | PRN

## 2013-09-03 MED ORDER — PROMETHAZINE HCL 25 MG/ML IJ SOLN
12.5000 mg | Freq: Once | INTRAMUSCULAR | Status: AC
  Administered 2013-09-03: 12.5 mg via INTRAVENOUS
  Filled 2013-09-03: qty 1

## 2013-09-03 MED ORDER — SODIUM CHLORIDE 0.9 % IV BOLUS (SEPSIS)
1000.0000 mL | Freq: Once | INTRAVENOUS | Status: AC
  Administered 2013-09-03: 1000 mL via INTRAVENOUS

## 2013-09-03 MED ORDER — PROMETHAZINE HCL 25 MG PO TABS: 25.0000 mg | ORAL_TABLET | Freq: Four times a day (QID) | ORAL | Status: DC | PRN

## 2013-09-03 MED ORDER — ONDANSETRON HCL 4 MG/2ML IJ SOLN
4.0000 mg | Freq: Once | INTRAMUSCULAR | Status: AC
  Administered 2013-09-03: 4 mg via INTRAVENOUS
  Filled 2013-09-03: qty 2

## 2013-09-03 MED ORDER — SODIUM CHLORIDE 0.9 % IV SOLN
Freq: Once | INTRAVENOUS | Status: AC
  Administered 2013-09-03: 1000 mL via INTRAVENOUS

## 2013-09-03 NOTE — ED Provider Notes (Signed)
CSN: [id number]     Arrival date & time 09/03/13  1239 History   First MD Initiated Contact with Patient 09/03/13 1401     Chief Complaint  Patient presents with  . Emesis     (Consider location/radiation/quality/duration/timing/severity/associated sxs/prior Treatment) Patient is a 43 y.o. female presenting with vomiting.  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 Multiple episodes of nonbloody emesis associated with upper abdominal discomfort. No fever.   Past Medical History  Diagnosis Date  . Anemia   . Depression   . GERD (gastroesophageal reflux disease)   . History of chicken pox   . UTI (lower urinary tract infection)   . Migraine   . Kidney stones   . IBS (irritable bowel syndrome)    Past Surgical History  Procedure Laterality Date  . Abdominal hysterectomy    . Tonsillectomy    . Cesarean section     Family History  Problem Relation Age of Onset  . Coronary artery disease Mother   . Coronary artery disease Father   . Heart attack Father   . Hypertension Mother   . Hypertension Paternal Grandmother   . Diabetes Maternal Grandfather    History  Substance Use Topics  . Smoking status: Current Every Day Smoker -- 0.80 packs/day    Types: Cigarettes  . Smokeless tobacco: Not on file  . Alcohol Use: Yes   OB History   Grav Para Term Preterm Abortions TAB SAB Ect Mult Living                 Review of Systems  Gastrointestinal: Positive for vomiting.    All other systems reviewed and are negative except as noted in HPI.    Allergies  Metoclopramide hcl and Tape  Home Medications   Prior to Admission medications   Medication Sig Start Date End Date Taking? Authorizing Provider  albuterol (PROVENTIL HFA;VENTOLIN HFA) 108 (90 BASE) MCG/ACT inhaler Inhale 1-2 puffs into the lungs every 6 (six) hours as needed for wheezing. 01/19/13  04/18/14  Midge Minium, MD  albuterol (PROVENTIL) (2.5 MG/3ML) 0.083% nebulizer solution Take 3 mLs (2.5 mg total) by nebulization every 6 (six) hours as needed for wheezing or shortness of breath. 08/01/13   Midge Minium, MD  amoxicillin (AMOXIL) 875 MG tablet Take 1 tablet (875 mg total) by mouth 2 (two) times daily. 08/01/13   Midge Minium, MD  citalopram (CELEXA) 40 MG tablet TAKE 1 TABLET (40 MG TOTAL) BY MOUTH DAILY.    Midge Minium, MD  clonazePAM (KLONOPIN) 1 MG tablet TAKE 1 TABLET BY MOUTH TWICE A DAY AS NEEDED FOR ANXIETY    Midge Minium, MD  cyclobenzaprine (FLEXERIL) 5 MG tablet Take 1 tablet (5 mg total) by mouth 3 (three) times daily as needed. May cause drowsiness. 06/08/12   Midge Minium, MD  fluconazole (DIFLUCAN) 150 MG tablet Take 1 tablet (150 mg total) by mouth once. 08/09/13   Alferd Apa Lowne, DO  mometasone (NASONEX) 50 MCG/ACT nasal spray 1 spray into L nostril twice a day as needed. Use the "crossover" technique as discussed 08/01/13   Midge Minium, MD  nystatin cream (MYCOSTATIN) Apply topically 2 (two) times daily. 01/19/13   Midge Minium, MD  promethazine (PHENERGAN) 25 MG tablet Take 25 mg by mouth every 6 (six) hours as needed. 09/05/11   Glendell Docker,  NP  promethazine-dextromethorphan (PROMETHAZINE-DM) 6.25-15 MG/5ML syrup Take 5 mLs by mouth 4 (four) times daily as needed. 08/01/13   Midge Minium, MD  traMADol Veatrice Bourbon) 50 MG tablet 1/2-1 every 6-8 hrs prn 02/22/12   Hendricks Limes, MD   BP 154/99  Pulse 65  Temp(Src) 97.8 F (36.6 C) (Oral)  Resp 18  Ht 5\' 2"  (1.575 m)  Wt 195 lb (88.451 kg)  BMI 35.66 kg/m2  SpO2 100% Physical Exam  Nursing note and vitals reviewed. Constitutional: She is oriented to person, place, and time. She appears well-developed and well-nourished.  HENT:  Head: Normocephalic and atraumatic.  Eyes: EOM are normal. Pupils are equal, round, and reactive to light.  Neck: Normal range of  motion. Neck supple.  Cardiovascular: Normal rate, normal heart sounds and intact distal pulses.   Pulmonary/Chest: Effort normal and breath sounds normal.  Abdominal: Bowel sounds are normal. She exhibits no distension. There is tenderness (mild, epigastric). There is no rebound and no guarding.  Musculoskeletal: Normal range of motion. She exhibits no edema and no tenderness.  Neurological: She is alert and oriented to person, place, and time. She has normal strength. No cranial nerve deficit or sensory deficit.  Skin: Skin is warm and dry. No rash noted.  Psychiatric: She has a normal mood and affect.    ED Course  Procedures (including critical care time) Labs Review Labs Reviewed  CBC WITH DIFFERENTIAL - Abnormal; Notable for the following:    WBC 10.9 (*)    Neutrophils Relative % 86 (*)    Neutro Abs 9.4 (*)    Lymphocytes Relative 11 (*)    Monocytes Relative 2 (*)    All other components within normal limits  COMPREHENSIVE METABOLIC PANEL - Abnormal; Notable for the following:    Potassium 3.5 (*)    Glucose, Bld 147 (*)    Total Bilirubin <0.2 (*)    All other components within normal limits  URINALYSIS, ROUTINE W REFLEX MICROSCOPIC - Abnormal; Notable for the following:    APPearance TURBID (*)    Ketones, ur 15 (*)    All other components within normal limits  PREGNANCY, URINE  LIPASE, BLOOD  URINE MICROSCOPIC-ADD ON    Imaging Review No results found.   EKG Interpretation None      MDM   Final diagnoses:  Vomiting    Labs unremarkable. Feeling better after Zofran and Phenergan. Attempting PO trial then plan discharge home.     Barri Neidlinger B. Karle Starch, MD 09/03/13 1544

## 2013-09-03 NOTE — ED Notes (Signed)
Vomiting since 7am.

## 2013-09-03 NOTE — Discharge Instructions (Signed)
Nausea, Adult Nausea is the feeling that you have an upset stomach or have to vomit. Nausea by itself is not likely a serious concern, but it may be an early sign of more serious medical problems. As nausea gets worse, it can lead to vomiting. If vomiting develops, there is the risk of dehydration.  CAUSES   Viral infections.  Food poisoning.  Medicines.  Pregnancy.  Motion sickness.  Migraine headaches.  Emotional distress.  Severe pain from any source.  Alcohol intoxication. HOME CARE INSTRUCTIONS  Get plenty of rest.  Ask your caregiver about specific rehydration instructions.  Eat small amounts of food and sip liquids more often.  Take all medicines as told by your caregiver. SEEK MEDICAL CARE IF:  You have not improved after 2 days, or you get worse.  You have a headache. SEEK IMMEDIATE MEDICAL CARE IF:   You have a fever.  You faint.  You keep vomiting or have blood in your vomit.  You are extremely weak or dehydrated.  You have dark or bloody stools.  You have severe chest or abdominal pain. MAKE SURE YOU:  Understand these instructions.  Will watch your condition.  Will get help right away if you are not doing well or get worse. Document Released: 06/03/2004 Document Revised: 01/19/2012 Document Reviewed: 01/06/2011 [facility] Patient Information 2014 [place], [place].  Nausea and Vomiting Nausea is a sick feeling that often comes before throwing up (vomiting). Vomiting is a reflex where stomach contents come out of your mouth. Vomiting can cause severe loss of body fluids (dehydration). Children and elderly adults can become dehydrated quickly, especially if they also have diarrhea. Nausea and vomiting are symptoms of a condition or disease. It is important to find the cause of your symptoms. CAUSES   Direct irritation of the stomach lining. This irritation can result from increased acid production (gastroesophageal reflux disease), infection, food  poisoning, taking certain medicines (such as nonsteroidal anti-inflammatory drugs), alcohol use, or tobacco use.  Signals from the brain.These signals could be caused by a headache, heat exposure, an inner ear disturbance, increased pressure in the brain from injury, infection, a tumor, or a concussion, pain, emotional stimulus, or metabolic problems.  An obstruction in the gastrointestinal tract (bowel obstruction).  Illnesses such as diabetes, hepatitis, gallbladder problems, appendicitis, kidney problems, cancer, sepsis, atypical symptoms of a heart attack, or eating disorders.  Medical treatments such as chemotherapy and radiation.  Receiving medicine that makes you sleep (general anesthetic) during surgery. DIAGNOSIS Your caregiver may ask for tests to be done if the problems do not improve after a few days. Tests may also be done if symptoms are severe or if the reason for the nausea and vomiting is not clear. Tests may include:  Urine tests.  Blood tests.  Stool tests.  Cultures (to look for evidence of infection).  X-rays or other imaging studies. Test results can help your caregiver make decisions about treatment or the need for additional tests. TREATMENT You need to stay well hydrated. Drink frequently but in small amounts.You may wish to drink water, sports drinks, clear broth, or eat frozen ice pops or gelatin dessert to help stay hydrated.When you eat, eating slowly may help prevent nausea.There are also some antinausea medicines that may help prevent nausea. HOME CARE INSTRUCTIONS   Take all medicine as directed by your caregiver.  If you do not have an appetite, do not force yourself to eat. However, you must continue to drink fluids.  If you have an  appetite, eat a normal diet unless your caregiver tells you differently.  Eat a variety of complex carbohydrates (rice, wheat, potatoes, bread), lean meats, yogurt, fruits, and vegetables.  Avoid high-fat foods  because they are more difficult to digest.  Drink enough water and fluids to keep your urine clear or pale yellow.  If you are dehydrated, ask your caregiver for specific rehydration instructions. Signs of dehydration may include:  Severe thirst.  Dry lips and mouth.  Dizziness.  Dark urine.  Decreasing urine frequency and amount.  Confusion.  Rapid breathing or pulse. SEEK IMMEDIATE MEDICAL CARE IF:   You have blood or brown flecks (like coffee grounds) in your vomit.  You have black or bloody stools.  You have a severe headache or stiff neck.  You are confused.  You have severe abdominal pain.  You have chest pain or trouble breathing.  You do not urinate at least once every 8 hours.  You develop cold or clammy skin.  You continue to vomit for longer than 24 to 48 hours.  You have a fever. MAKE SURE YOU:   Understand these instructions.  Will watch your condition.  Will get help right away if you are not doing well or get worse. Document Released: 04/26/2005 Document Revised: 07/19/2011 Document Reviewed: 09/23/2010 [facility] Patient Information 2014 [place], [place].

## 2013-09-20 ENCOUNTER — Encounter: Payer: Self-pay | Admitting: Family Medicine

## 2013-09-20 ENCOUNTER — Ambulatory Visit (INDEPENDENT_AMBULATORY_CARE_PROVIDER_SITE_OTHER): Payer: BC Managed Care – PPO | Admitting: Family Medicine

## 2013-09-20 VITALS — BP 128/82 | HR 86 | Temp 98.4°F | Resp 16 | Wt 193.2 lb

## 2013-09-20 DIAGNOSIS — F3289 Other specified depressive episodes: Secondary | ICD-10-CM

## 2013-09-20 DIAGNOSIS — F329 Major depressive disorder, single episode, unspecified: Secondary | ICD-10-CM

## 2013-09-20 DIAGNOSIS — F411 Generalized anxiety disorder: Secondary | ICD-10-CM

## 2013-09-20 DIAGNOSIS — R6882 Decreased libido: Secondary | ICD-10-CM | POA: Insufficient documentation

## 2013-09-20 DIAGNOSIS — R5383 Other fatigue: Secondary | ICD-10-CM

## 2013-09-20 DIAGNOSIS — R5381 Other malaise: Secondary | ICD-10-CM

## 2013-09-20 MED ORDER — VORTIOXETINE HBR 20 MG PO TABS: 20.0000 mg | ORAL_TABLET | Freq: Every day | ORAL | Status: DC

## 2013-09-20 NOTE — Assessment & Plan Note (Signed)
New.  Check labs to r/o metabolic cause.  Will follow.
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
## 2013-09-20 NOTE — Patient Instructions (Signed)
Follow up in 4-6 weeks to recheck mood and libido STOP the Celexa and START the Brintellix once you have the script Decrease the Klonopin to 1/2 tab nightly We'll notify you of your lab results and make any changes if needed Call with any questions or concerns Hang in there!!!
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
## 2013-09-20 NOTE — Assessment & Plan Note (Signed)
Chronic problem.  Pt is attempting to wean klonopin but knows that she needs to take this at night for RLS.  Will attempt to decrease to 1/2 mg QHS.  Will follow.

## 2013-09-20 NOTE — Progress Notes (Signed)
Pre visit review using our clinic review tool, if applicable. No additional management support is needed unless otherwise documented below in the visit note. 

## 2013-09-21 ENCOUNTER — Encounter: Payer: Self-pay | Admitting: General Practice

## 2013-09-21 ENCOUNTER — Telehealth: Payer: Self-pay | Admitting: Family Medicine

## 2013-09-21 LAB — BASIC METABOLIC PANEL
BUN: 10 mg/dL (ref 6–23)
CO2: 28 mEq/L (ref 19–32)
CREATININE: 0.9 mg/dL (ref 0.4–1.2)
Calcium: 8.8 mg/dL (ref 8.4–10.5)
Chloride: 105 mEq/L (ref 96–112)
GFR: 72.45 mL/min (ref >60.00)
Glucose, Bld: 68 mg/dL — ABNORMAL LOW (ref 70–99)
Potassium: 4.1 mEq/L (ref 3.5–5.1)
Sodium: 140 mEq/L (ref 135–145)

## 2013-09-21 LAB — CBC WITH DIFFERENTIAL/PLATELET
BASOS ABS: 0 10*3/uL (ref 0.0–0.1)
Basophils Relative: 0.7 % (ref 0.0–3.0)
Eosinophils Absolute: 0.2 10*3/uL (ref 0.0–0.7)
Eosinophils Relative: 2.9 % (ref 0.0–5.0)
HCT: 41.1 % (ref 36.0–46.0)
Hemoglobin: 13.7 g/dL (ref 12.0–15.0)
Lymphocytes Relative: 35.2 % (ref 12.0–46.0)
Lymphs Abs: 2.4 10*3/uL (ref 0.7–4.0)
MCHC: 33.2 g/dL (ref 30.0–36.0)
MCV: 95.7 fl (ref 78.0–100.0)
Monocytes Absolute: 0.4 10*3/uL (ref 0.1–1.0)
Monocytes Relative: 5.8 % (ref 3.0–12.0)
Neutro Abs: 3.7 10*3/uL (ref 1.4–7.7)
Neutrophils Relative %: 55.4 % (ref 43.0–77.0)
PLATELETS: 247 10*3/uL (ref 150.0–400.0)
RBC: 4.3 Mil/uL (ref 3.87–5.11)
RDW: 13.6 % (ref 11.5–15.5)
WBC: 6.7 10*3/uL (ref 4.0–10.5)

## 2013-09-21 LAB — HEPATIC FUNCTION PANEL
ALT: 17 U/L (ref 0–35)
AST: 19 U/L (ref 0–37)
Albumin: 3.8 g/dL (ref 3.5–5.2)
Alkaline Phosphatase: 57 U/L (ref 39–117)
BILIRUBIN TOTAL: 0.4 mg/dL (ref 0.2–1.2)
Bilirubin, Direct: 0 mg/dL (ref 0.0–0.3)
Total Protein: 6.6 g/dL (ref 6.0–8.3)

## 2013-09-21 LAB — TSH: TSH: 0.93 u[IU]/mL (ref 0.35–4.50)

## 2013-09-21 NOTE — Telephone Encounter (Signed)
Relevant patient education mailed to patient.  

## 2013-10-05 ENCOUNTER — Other Ambulatory Visit: Payer: Self-pay | Admitting: Family Medicine

## 2013-10-08 ENCOUNTER — Other Ambulatory Visit: Payer: Self-pay | Admitting: Family Medicine

## 2013-10-08 NOTE — Telephone Encounter (Signed)
Med filled and faxed.  

## 2013-10-08 NOTE — Telephone Encounter (Signed)
[facility] for #30, no refills but must stop smoking marijuana in order to continue getting meds

## 2013-10-08 NOTE — Telephone Encounter (Signed)
Med filled.  

## 2013-10-08 NOTE — Telephone Encounter (Signed)
Med filled #30 earlier, due to [facility] on UDS.

## 2013-10-08 NOTE — Telephone Encounter (Signed)
Last OV 09-20-13 Last filled 02-17-13 30 with 1   High risk THC

## 2013-10-24 ENCOUNTER — Ambulatory Visit: Payer: BC Managed Care – PPO | Admitting: Family Medicine

## 2013-10-29 ENCOUNTER — Encounter: Payer: Self-pay | Admitting: Family Medicine

## 2013-10-29 ENCOUNTER — Ambulatory Visit (INDEPENDENT_AMBULATORY_CARE_PROVIDER_SITE_OTHER): Payer: BC Managed Care – PPO | Admitting: Family Medicine

## 2013-10-29 VITALS — BP 118/78 | HR 84 | Temp 98.1°F | Resp 17 | Wt 193.5 lb

## 2013-10-29 DIAGNOSIS — J0100 Acute maxillary sinusitis, unspecified: Secondary | ICD-10-CM

## 2013-10-29 DIAGNOSIS — J4541 Moderate persistent asthma with (acute) exacerbation: Secondary | ICD-10-CM

## 2013-10-29 DIAGNOSIS — F329 Major depressive disorder, single episode, unspecified: Secondary | ICD-10-CM

## 2013-10-29 DIAGNOSIS — J45901 Unspecified asthma with (acute) exacerbation: Secondary | ICD-10-CM

## 2013-10-29 DIAGNOSIS — F3289 Other specified depressive episodes: Secondary | ICD-10-CM

## 2013-10-29 MED ORDER — IPRATROPIUM-ALBUTEROL 0.5-2.5 (3) MG/3ML IN SOLN
3.0000 mL | Freq: Once | RESPIRATORY_TRACT | Status: AC
  Administered 2013-10-29: 3 mL via RESPIRATORY_TRACT

## 2013-10-29 MED ORDER — AMOXICILLIN 875 MG PO TABS: 875.0000 mg | ORAL_TABLET | Freq: Two times a day (BID) | ORAL | Status: DC

## 2013-10-29 MED ORDER — CITALOPRAM HYDROBROMIDE 20 MG PO TABS: 20.0000 mg | ORAL_TABLET | Freq: Every day | ORAL | Status: DC

## 2013-10-29 MED ORDER — PROMETHAZINE-DM 6.25-15 MG/5ML PO SYRP: 5.0000 mL | ORAL_SOLUTION | Freq: Four times a day (QID) | ORAL | Status: DC | PRN

## 2013-10-29 MED ORDER — ALBUTEROL SULFATE HFA 108 (90 BASE) MCG/ACT IN AERS: 1.0000 | INHALATION_SPRAY | Freq: Four times a day (QID) | RESPIRATORY_TRACT | Status: DC | PRN

## 2013-10-29 NOTE — Progress Notes (Signed)
   Subjective:    Patient ID: Beverly Elliott, female    DOB: 30-Nov-1970, 43 y.o.   MRN: [id number]  Cough Associated symptoms include shortness of breath.  Shortness of Breath   Asthma- no relief w/ nebulizer treatments, is out of inhaler.  sxs started 5 days ago.  Taking Nyquil for cough.  Cough is productive '1st thing in the morning'.  Cough persists throughout the day but is not productive.  + sick contacts.  No fevers.  + HA and frontal sinus pressure.  Using nasonex w/o relief.  Bilateral ear fullness.  No N/V/D.  Depression- pt reports the Brintellix was 'too expensive' and her husband 'wouldn't let me get it'.  Wants to try decreasing Celexa dose to 20mg  and see how she does.   Review of Systems  Respiratory: Positive for cough and shortness of breath.    For ROS see HPI     Objective:   Physical Exam  Vitals reviewed. Constitutional: She appears well-developed and well-nourished. No distress.  HENT:  Head: Normocephalic and atraumatic.  TMs normal bilaterally Mild nasal congestion + TTP over maxillary sinuses Throat w/out erythema, edema, or exudate  Eyes: Conjunctivae and EOM are normal. Pupils are equal, round, and reactive to light.  Neck: Normal range of motion. Neck supple.  Cardiovascular: Normal rate, regular rhythm, normal heart sounds and intact distal pulses.   No murmur heard. Pulmonary/Chest: Effort normal and breath sounds normal. No respiratory distress. She has no wheezes.  + hacking cough  Lymphadenopathy:    She has no cervical adenopathy.          Assessment & Plan:

## 2013-10-29 NOTE — Patient Instructions (Signed)
Cancel your appt on Wednesday and schedule a f/u in 1 month to recheck mood Restart the Celexa 20mg  (1/2 tab daily) Start the Amoxicillin twice daily for the sinus infection and bronchitis Use the cough syrup as needed- will cause drowsiness REST! Drink plenty of fluids Continue the albuterol inhaler as needed for chest tightness/wheezing Call with any questions or concerns Hang in there!!!

## 2013-10-29 NOTE — Assessment & Plan Note (Signed)
Pt's sxs and PE consistent w/ infxn.  Start abx.  Reviewed supportive care and red flags that should prompt return.  Pt expressed understanding and is in agreement w/ plan.  

## 2013-10-29 NOTE — Assessment & Plan Note (Signed)
Pt's sxs and PE consistent w/ asthmatic bronchitis.  No wheezing on PE but pt c/o feeling 'tight'.  sxs improved after Duoneb.  Start abx, cough syrup, refilled HFA, and encouraged smoking cessation.  Reviewed supportive care and red flags that should prompt return.  Pt expressed understanding and is in agreement w/ plan.

## 2013-10-29 NOTE — Progress Notes (Signed)
Pre visit review using our clinic review tool, if applicable. No additional management support is needed unless otherwise documented below in the visit note. 

## 2013-10-29 NOTE — Assessment & Plan Note (Signed)
Pt reports Brintellix was too expensive.  Interested in decreasing dose of Celexa to 20mg  and monitoring for improvement.  If depression and anxiety are not well controlled at lower dose, will add Wellbutrin at next visit for both mood and smoking cessation.  Pt expressed understanding and is in agreement w/ plan.

## 2013-10-31 ENCOUNTER — Ambulatory Visit: Payer: BC Managed Care – PPO | Admitting: Family Medicine

## 2013-11-29 ENCOUNTER — Ambulatory Visit: Payer: BC Managed Care – PPO | Admitting: Family Medicine

## 2013-11-29 DIAGNOSIS — Z0289 Encounter for other administrative examinations: Secondary | ICD-10-CM

## 2013-12-06 ENCOUNTER — Other Ambulatory Visit: Payer: Self-pay | Admitting: Family Medicine

## 2013-12-07 NOTE — Telephone Encounter (Signed)
Last ov 10-29-13 Med filled 10-05-13 #30 with 0  Pt cannot have more than #30 due to [facility], uds needed.

## 2013-12-26 ENCOUNTER — Telehealth: Payer: Self-pay

## 2013-12-26 NOTE — Telephone Encounter (Signed)
Celexa is on the $4 list at [facility] so she should be able to continue this.  I would not just stop the Klonopin- she should call the pharmacy and ask how much this would cost w/o insurance.  If she cannot afford it, I would take it every other day and then stop

## 2013-12-26 NOTE — Telephone Encounter (Signed)
Caller name:Malli Relation to pt: Call back number:[phone] Pharmacy:  Reason for call: September called today to cancel her CPE for tomorrow because her husband lost his job last week and their insurance stopped that day. She needs to talk to you about her medicines citalopram (CELEXA) 20 MG tablet / clonazePAM (KLONOPIN) 1 MG tablet, since she will not have insurance, she will not be able to take these medicines. She hopes he will have another job with insurance soon.

## 2013-12-26 NOTE — Telephone Encounter (Signed)
Please advise 

## 2013-12-27 NOTE — Telephone Encounter (Signed)
Pt notified and expressed an understanding in provider's advice.

## 2013-12-28 ENCOUNTER — Encounter: Payer: BC Managed Care – PPO | Admitting: Family Medicine

## 2014-01-17 ENCOUNTER — Encounter: Payer: Self-pay | Admitting: Family Medicine

## 2014-01-31 ENCOUNTER — Telehealth: Payer: Self-pay

## 2014-01-31 ENCOUNTER — Telehealth: Payer: Self-pay | Admitting: General Practice

## 2014-01-31 NOTE — Telephone Encounter (Signed)
Beverly Elliott Self [phone] [facility] [address]  [name] called to check on refill for her clonazePAM (KLONOPIN) 1 MG tablet

## 2014-01-31 NOTE — Telephone Encounter (Signed)
Spoke with patient who states that she has not been taking the Klonopin for the last two weeks so she knows it will not be in her system. States that her husband lost his job. Advised that as of right now we would not be able to right.   Please advise

## 2014-01-31 NOTE — Telephone Encounter (Signed)
Can you call pt and advise per Tabori note in the [facility]. Pt is not to receive any more controlled due to + for THC on 2 occassions. Pt can repeat UDS and we can determine after the results come back.

## 2014-01-31 NOTE — Telephone Encounter (Signed)
The issue isn't the lack of Klonopin in her system, it's the presence of marijuana x2.  We can provide #30 to ease stress of job loss but we cannot continue to prescribe w/o UDS and w/ pt still smoking marijuana

## 2014-01-31 NOTE — Telephone Encounter (Signed)
Last OV 10-29-13 Clonazepam filled 10-29-13 #30 with 0  High risk, THC

## 2014-01-31 NOTE — Telephone Encounter (Signed)
No controlled substance refills.  Pt was + for THC and negative for Klonopin.

## 2014-01-31 NOTE — Telephone Encounter (Signed)
Phone note already entered. Closing this encounter.

## 2014-02-01 MED ORDER — CLONAZEPAM 1 MG PO TABS: ORAL_TABLET | ORAL | Status: DC

## 2014-02-01 NOTE — Telephone Encounter (Signed)
Patient states that she has no insurance so has no money to take the drug test for 90days when her husband's new insurance goes into effect.

## 2014-02-01 NOTE — Telephone Encounter (Signed)
It was my understanding that there was no cost to the pt for the UDS since it was office policy

## 2014-02-01 NOTE — Telephone Encounter (Signed)
Med printed and faxed.

## 2014-02-01 NOTE — Telephone Encounter (Signed)
[facility] for #30, no refills.  Will need to take test once she has insurance

## 2014-02-01 NOTE — Telephone Encounter (Signed)
In reference to the once that I have experienced they bill the patient's insurance.

## 2014-06-17 ENCOUNTER — Encounter: Payer: Self-pay | Admitting: Family Medicine

## 2014-06-17 ENCOUNTER — Ambulatory Visit (INDEPENDENT_AMBULATORY_CARE_PROVIDER_SITE_OTHER): Payer: Self-pay | Admitting: Family Medicine

## 2014-06-17 VITALS — BP 120/78 | HR 93 | Temp 98.1°F | Resp 16 | Wt 202.0 lb

## 2014-06-17 DIAGNOSIS — M799 Soft tissue disorder, unspecified: Secondary | ICD-10-CM

## 2014-06-17 DIAGNOSIS — M67441 Ganglion, right hand: Secondary | ICD-10-CM | POA: Insufficient documentation

## 2014-06-17 DIAGNOSIS — M5417 Radiculopathy, lumbosacral region: Secondary | ICD-10-CM

## 2014-06-17 DIAGNOSIS — M7989 Other specified soft tissue disorders: Secondary | ICD-10-CM

## 2014-06-17 MED ORDER — CYCLOBENZAPRINE HCL 10 MG PO TABS: 10.0000 mg | ORAL_TABLET | Freq: Three times a day (TID) | ORAL | Status: DC | PRN

## 2014-06-17 NOTE — Assessment & Plan Note (Signed)
New.  Mild discomfort but no triggering of finger at this time.  Pt interested in watchful waiting at this time.  Reviewed red flags for pt to call for hand referral.  Pt expressed understanding and is in agreement w/ plan.

## 2014-06-17 NOTE — Patient Instructions (Signed)
Schedule your complete physical at your convenience The areas in question or either Lipomas or Cysts- both are benign and we'll continue to watch them If the area in your hand becomes more painful or you develop trigger finger, call me so we can refer to the hand specialist Call with any questions or concerns Happy Valentine's Day!!!

## 2014-06-17 NOTE — Assessment & Plan Note (Signed)
Chronic problem for pt.  Due to this, pt is unable to lift or transport severely handicapped daughter w/o pain and difficulty.  Letter provided for pt to give to disability services in [place] so that they can continue their home healthy aide/RN services for daughter.

## 2014-06-17 NOTE — Assessment & Plan Note (Signed)
New.  Consistent w/ lipoma or sebaceous cysts.  Not painful at this time.  No signs of infxn.  Plan is for watchful waiting at this time.

## 2014-06-17 NOTE — Progress Notes (Signed)
   Subjective:    Patient ID: Beverly Elliott, female    DOB: [birth year], 44 y.o.   MRN: [id number]  HPI Soft tissue mass- pt w/ area on medial R upper arm and posterior R upper arm.  Small area just left of belly button.  Firm, not tender.  Not itchy.  Just below surface.    R 1st MTP mass- pt reports area can be sore.  Does not complain of trigger finger.  Back pain- chronic problem for pt.  Had complete w/u previously and it was found she has 2 bulging discs causing radicular pain.  Pt is unable to lift/transport her severely handicapped child.  Needs letter to disability services in [place] to justify need for daughter's home health aide/RN.   Review of Systems For ROS see HPI     Objective:   Physical Exam  Constitutional: She is oriented to person, place, and time. She appears well-developed and well-nourished. No distress.  HENT:  Head: Normocephalic and atraumatic.  Neurological: She is alert and oriented to person, place, and time. No cranial nerve deficit. Coordination normal.  Skin: Skin is warm and dry. No rash noted. No erythema.  Pt w/ small, soft, freely mobile soft tissue mass in subcutaneous tissue of R medial upper arm, R posterior upper arm, and just L of umbilicus all consistent w/ lipoma or small cysts.  Non tender  Pt w/ apparent ganglion cyst of R ventral 1st MCP joint  Psychiatric: She has a normal mood and affect. Her behavior is normal. Thought content normal.  Vitals reviewed.         Assessment & Plan:

## 2014-06-17 NOTE — Progress Notes (Signed)
Pre visit review using our clinic review tool, if applicable. No additional management support is needed unless otherwise documented below in the visit note. 

## 2014-06-25 ENCOUNTER — Telehealth: Payer: Self-pay | Admitting: Family Medicine

## 2014-06-25 MED ORDER — NYSTATIN 100000 UNIT/GM EX CREA: TOPICAL_CREAM | Freq: Two times a day (BID) | CUTANEOUS | Status: DC

## 2014-06-25 MED ORDER — CYCLOBENZAPRINE HCL 10 MG PO TABS: 10.0000 mg | ORAL_TABLET | Freq: Three times a day (TID) | ORAL | Status: DC | PRN

## 2014-06-25 NOTE — Telephone Encounter (Signed)
Please advise, pt was seen 06/17/14 and there is no mention of the nystatin  in the note, our records show that [facility] [address] [facility] day

## 2014-06-25 NOTE — Telephone Encounter (Signed)
Ok to refill Nystatin ointment that is on historical med list.  Please check w/ pharmacy re: flexeril

## 2014-06-25 NOTE — Telephone Encounter (Signed)
meds filled

## 2014-06-25 NOTE — Telephone Encounter (Signed)
Caller name: Sabrine Relation to pt: self Call back number: [phone] Pharmacy: [facility] [address]  Reason for call:   Patient states that she was told at last visit that an rx for flexeril a nystatin cream was going to be called in for her. She states that the pharmacy does not have these prescriptions.

## 2014-07-15 ENCOUNTER — Telehealth: Payer: Self-pay | Admitting: General Practice

## 2014-07-15 DIAGNOSIS — F32A Depression, unspecified: Secondary | ICD-10-CM

## 2014-07-15 DIAGNOSIS — F329 Major depressive disorder, single episode, unspecified: Secondary | ICD-10-CM

## 2014-07-15 MED ORDER — CITALOPRAM HYDROBROMIDE 40 MG PO TABS: 40.0000 mg | ORAL_TABLET | Freq: Every day | ORAL | Status: AC

## 2014-07-15 NOTE — Telephone Encounter (Signed)
Med filled.  

## 2014-07-15 NOTE — Telephone Encounter (Signed)
Last OV 06-17-14 Citalopram last filled 06-19-12 #90 with 3  Please advise on how many to dispense?

## 2014-07-15 NOTE — Telephone Encounter (Signed)
Ok for #90, 3 refills 

## 2014-09-11 ENCOUNTER — Telehealth: Payer: Self-pay | Admitting: Family Medicine

## 2014-09-11 NOTE — Telephone Encounter (Signed)
Pre Visit letter sent  °

## 2014-10-02 ENCOUNTER — Encounter: Payer: Self-pay | Admitting: Family Medicine

## 2017-01-12 DIAGNOSIS — J301 Allergic rhinitis due to pollen: Secondary | ICD-10-CM | POA: Insufficient documentation

## 2017-01-12 DIAGNOSIS — R3915 Urgency of urination: Secondary | ICD-10-CM | POA: Insufficient documentation

## 2017-01-12 DIAGNOSIS — M5137 Other intervertebral disc degeneration, lumbosacral region: Secondary | ICD-10-CM | POA: Insufficient documentation

## 2017-01-12 DIAGNOSIS — K760 Fatty (change of) liver, not elsewhere classified: Secondary | ICD-10-CM | POA: Insufficient documentation

## 2017-03-14 DIAGNOSIS — K589 Irritable bowel syndrome without diarrhea: Secondary | ICD-10-CM | POA: Insufficient documentation

## 2017-03-14 DIAGNOSIS — F411 Generalized anxiety disorder: Secondary | ICD-10-CM | POA: Insufficient documentation

## 2017-03-14 DIAGNOSIS — F410 Panic disorder [episodic paroxysmal anxiety] without agoraphobia: Secondary | ICD-10-CM | POA: Insufficient documentation

## 2017-04-04 ENCOUNTER — Ambulatory Visit (INDEPENDENT_AMBULATORY_CARE_PROVIDER_SITE_OTHER): Payer: BLUE CROSS/BLUE SHIELD

## 2017-04-04 ENCOUNTER — Encounter: Payer: Self-pay | Admitting: Podiatry

## 2017-04-04 ENCOUNTER — Ambulatory Visit: Payer: BLUE CROSS/BLUE SHIELD | Admitting: Podiatry

## 2017-04-04 ENCOUNTER — Ambulatory Visit: Payer: BLUE CROSS/BLUE SHIELD

## 2017-04-04 DIAGNOSIS — D3610 Benign neoplasm of peripheral nerves and autonomic nervous system, unspecified: Secondary | ICD-10-CM

## 2017-04-04 DIAGNOSIS — M779 Enthesopathy, unspecified: Secondary | ICD-10-CM | POA: Diagnosis not present

## 2017-04-04 NOTE — Progress Notes (Signed)
Subjective:    Patient ID: Beverly Elliott, female   DOB: 46 y.o.   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  She states that she previously had a cortisone shot about 8 years ago.  She states that she has been doing good and having no pain until about 4-6 weeks ago and the symptoms started to recur.  She states that the exact same pain that she had several years ago.  She denies any recent injury or trauma denies any change or increase in activity level.  She states that she can notice some mild swelling to her foot along this area she points to the third interspace and some occasional tingling to her toes but not on a continual basis.  She does take ibuprofen on a regular basis for her back as well as also helps her feet.  She has no other concerns today.  Review of Systems  All other systems reviewed and are negative.  Past Medical History:  Diagnosis Date  . Anemia   . Asthma   . Depression   . GERD (gastroesophageal reflux disease)   . History of chicken pox   . IBS (irritable bowel syndrome)   . Kidney stones   . Migraine   . UTI (lower urinary tract infection)     Past Surgical History:  Procedure Laterality Date  . ABDOMINAL HYSTERECTOMY    . CESAREAN SECTION    . TONSILLECTOMY       Current Outpatient Medications:  .  cetirizine (ZYRTEC) 10 MG tablet, Take by mouth., Disp: , Rfl:  .  albuterol (PROVENTIL HFA;VENTOLIN HFA) 108 (90 BASE) MCG/ACT inhaler, Inhale 1-2 puffs into the lungs every 6 (six) hours as needed for wheezing., Disp: 1 Inhaler, Rfl: 6 .  albuterol (PROVENTIL) (2.5 MG/3ML) 0.083% nebulizer solution, Take 3 mLs (2.5 mg total) by nebulization every 6 (six) hours as needed for wheezing or shortness of breath. (Patient not taking: Reported on 04/04/2017), Disp: 150 mL, Rfl: 1 .  cyclobenzaprine (FLEXERIL) 10 MG tablet, Take 1 tablet (10 mg total) by mouth 3 (three) times daily as needed  for muscle spasms. (Patient not taking: Reported on 04/04/2017), Disp: 30 tablet, Rfl: 0 .  dicyclomine (BENTYL) 20 MG tablet, TAKE ONE TABLET BY MOUTH DAILY AS NEEDED (COLON SPASMS)., Disp: , Rfl: 3 .  mometasone (NASONEX) 50 MCG/ACT nasal spray, 1 spray into L nostril twice a day as needed. Use the "crossover" technique as discussed (Patient not taking: Reported on 04/04/2017), Disp: 17 g, Rfl: 12 .  nystatin cream (MYCOSTATIN), Apply topically 2 (two) times daily., Disp: 30 g, Rfl: 3  Allergies  Allergen Reactions  . Metoclopramide Hcl Nausea And Vomiting    Vomited for 8 hours  . Tape Rash    Social History   Socioeconomic History  . Marital status: Married    Spouse name: Not on file  . Number of children: Not on file  . Years of education: Not on file  . Highest education level: Not on file  Social Needs  . Financial resource strain: Not on file  . Food insecurity - worry: Not on file  . Food insecurity - inability: Not on file  . Transportation needs - medical: Not on file  . Transportation needs - non-medical: Not on file  Occupational History  . Not on file  Tobacco Use  . Smoking status: Current Every Day Smoker  Packs/day: 0.80    Types: Cigarettes  Substance and Sexual Activity  . Alcohol use: Yes  . Drug use: Yes    Types: Marijuana  . Sexual activity: Not on file  Other Topics Concern  . Not on file  Social History Narrative   Has a special needs daughter - preemie, had stroke.         Objective:  Physical Exam General: AAO x3, NAD  Dermatological: Skin is warm, dry and supple bilateral. Nails x 10 are well manicured; remaining integument appears unremarkable at this time. There are no open sores, no preulcerative lesions, no rash or signs of infection present.  Vascular: Dorsalis Pedis artery and Posterior Tibial artery pedal pulses are 2/4 bilateral with immedate capillary fill time. There is no pain with calf compression, swelling, warmth,  erythema.   Neruologic: Grossly intact via light touch bilateral.Protective threshold with Semmes Wienstein monofilament intact to all pedal sites bilateral.  Negative Tinel sign.  Musculoskeletal: There is tenderness on the proximal aspect the left third interspace.  No palpable neuroma is identified at this time.  There is no significant edema, erythema, increase in warmth.  Unable to identify any numbness or tingling to her toes upon palpation.  There is no area pinpoint tenderness there is no pain to vibratory sensation.  Equinus is present.  Gait: Unassisted, Nonantalgic.     Assessment:     Tendinitis left third interspace, possible neuroma    Plan:     -Treatment options discussed including all alternatives, risks, and complications -Etiology of symptoms were discussed as it is more of a tendinitis as opposed to true neuroma although she is previously been diagnosed with neuroma to this area and her symptoms are the same. -X-rays were obtained and reviewed with the patient.  No evidence of acute fracture identified -She is requesting steroid injection today.  See procedure note below. -Offloading pads were dispensed. -Discussed shoe modifications and orthotics -Discussed change in anti-inflammatory medicine but she wishes to stay with ibuprofen 800 mg twice a day that she has been taking.  Procedure: Injection Tendon/Ligament Discussed alternatives, risks, complications and verbal consent was obtained.  Location: Left 3rd interspace Skin Prep: Alcohol. Injectate: 1 cc 0.5% marcaine plain, 1 cc 0.5% Marcaine plain and, 1 cc kenalog 10. Disposition: Patient tolerated procedure well. Injection site dressed with a band-aid.  Post-injection care was discussed and return precautions discussed.   Trula Slade DPM

## 2017-04-04 NOTE — Patient Instructions (Signed)
Plantar Fasciitis Rehab Ask your health care provider which exercises are safe for you. Do exercises exactly as told by your health care provider and adjust them as directed. It is normal to feel mild stretching, pulling, tightness, or discomfort as you do these exercises, but you should stop right away if you feel sudden pain or your pain gets worse. Do not begin these exercises until told by your health care provider. Stretching and range of motion exercises These exercises warm up your muscles and joints and improve the movement and flexibility of your foot. These exercises also help to relieve pain. Exercise A: Plantar fascia stretch  1. Sit with your left / right leg crossed over your opposite knee. 2. Hold your heel with one hand with that thumb near your arch. With your other hand, hold your toes and gently pull them back toward the top of your foot. You should feel a stretch on the bottom of your toes or your foot or both. 3. Hold this stretch for__________ seconds. 4. Slowly release your toes and return to the starting position. Repeat __________ times. Complete this exercise __________ times a day. Exercise B: Gastroc, standing  1. Stand with your hands against a wall. 2. Extend your left / right leg behind you, and bend your front knee slightly. 3. Keeping your heels on the floor and keeping your back knee straight, shift your weight toward the wall without arching your back. You should feel a gentle stretch in your left / right calf. 4. Hold this position for __________ seconds. Repeat __________ times. Complete this exercise __________ times a day. Exercise C: Soleus, standing 1. Stand with your hands against a wall. 2. Extend your left / right leg behind you, and bend your front knee slightly. 3. Keeping your heels on the floor, bend your back knee and slightly shift your weight over the back leg. You should feel a gentle stretch deep in your calf. 4. Hold this position for  __________ seconds. Repeat __________ times. Complete this exercise __________ times a day. Exercise D: Gastrocsoleus, standing 1. Stand with the ball of your left / right foot on a step. The ball of your foot is on the walking surface, right under your toes. 2. Keep your other foot firmly on the same step. 3. Hold onto the wall or a railing for balance. 4. Slowly lift your other foot, allowing your body weight to press your heel down over the edge of the step. You should feel a stretch in your left / right calf. 5. Hold this position for __________ seconds. 6. Return both feet to the step. 7. Repeat this exercise with a slight bend in your left / right knee. Repeat __________ times with your left / right knee straight and __________ times with your left / right knee bent. Complete this exercise __________ times a day. Balance exercise This exercise builds your balance and strength control of your arch to help take pressure off your plantar fascia. Exercise E: Single leg stand 1. Without shoes, stand near a railing or in a doorway. You may hold onto the railing or door frame as needed. 2. Stand on your left / right foot. Keep your big toe down on the floor and try to keep your arch lifted. Do not let your foot roll inward. 3. Hold this position for __________ seconds. 4. If this exercise is too easy, you can try it with your eyes closed or while standing on a pillow. Repeat __________ times. Complete  this exercise __________ times a day. This information is not intended to replace advice given to you by your health care provider. Make sure you discuss any questions you have with your health care provider. Document Released: 04/26/2005 Document Revised: 12/30/2015 Document Reviewed: 03/10/2015 Elsevier Interactive Patient Education  2018 [place] Neuralgia [place] Lathe neuralgia is a type of foot pain in the area closest to your toes. This area is sometimes called the ball of your  foot. Morton neuralgia occurs when a branch of a nerve in your foot (digital nerve) becomes compressed. When this happens over a long period of time, the nerve can thicken (neuroma) and cause pain. This usually occurs between the third and fourth toe. Morton neuralgia can come and go but may get worse over time. What are the causes? Your digital nerve can become compressed and stretched at a point where it passes under a thick band of tissue that connects your toes (intermetatarsal ligament). Morton neuralgia can be caused by mild repetitive damage in this area. This type of damage can result from:  Activities such as running or jumping.  Wearing shoes that are too tight.  What increases the risk? You may be at risk for Morton neuralgia if you:  Are female.  Wear high heels.  Wear shoes that are narrow or tight.  Participate in activities that stretch your toes. These include: ? Running. ? [place]. ? Long-distance walking.  What are the signs or symptoms? The first symptom of Morton neuralgia is pain that spreads from the ball of your foot to your toes. It may feel like you are walking on a marble. Pain usually gets worse with walking and goes away at night. Other symptoms may include numbness and cramping of your toes. How is this diagnosed? Your health care provider will do a physical exam. When doing the exam, your health care provider may:  Squeeze your foot just behind your toe.  Ask you to move your toes to check for pain.  You may also have tests on your foot to confirm the diagnosis. These may include:  An X-ray.  An MRI.  How is this treated? Treatment for Morton neuralgia may be as simple as changing the kind of shoes you wear. Other treatments may include:  Wearing a supportive pad (orthosis) under the front of your foot. This lifts your toe bones and takes pressure off the nerve.  Getting injections of numbing medicine and anti-inflammatory medicine (steroid) in  the nerve.  Having surgery to remove part of the thickened nerve.  Follow these instructions at home:  Take medicine only as directed by your health care provider.  Wear soft-soled shoes with a wide toe area.  Stop activities that may be causing pain.  Elevate your foot when resting.  Massage your foot.  Apply ice to the injured area: ? Put ice in a plastic bag. ? Place a towel between your skin and the bag. ? Leave the ice on for 20 minutes, 2-3 times a day.  Keep all follow-up visits as directed by your health care provider. This is important. Contact a health care provider if:  Home care instructions are not helping you get better.  Your symptoms change or get worse. This information is not intended to replace advice given to you by your health care provider. Make sure you discuss any questions you have with your health care provider. Document Released: 08/02/2000 Document Revised: 10/02/2015 Document Reviewed: 06/27/2013 Elsevier Interactive Patient Education  2018  [employer].

## 2017-05-05 ENCOUNTER — Ambulatory Visit: Payer: BLUE CROSS/BLUE SHIELD | Admitting: Podiatry

## 2018-07-24 DIAGNOSIS — I10 Essential (primary) hypertension: Secondary | ICD-10-CM | POA: Insufficient documentation

## 2019-03-14 DIAGNOSIS — M1990 Unspecified osteoarthritis, unspecified site: Secondary | ICD-10-CM | POA: Insufficient documentation

## 2019-06-18 ENCOUNTER — Other Ambulatory Visit: Payer: Self-pay

## 2019-06-18 ENCOUNTER — Ambulatory Visit (INDEPENDENT_AMBULATORY_CARE_PROVIDER_SITE_OTHER): Payer: 59

## 2019-06-18 ENCOUNTER — Ambulatory Visit: Payer: 59 | Admitting: Podiatry

## 2019-06-18 ENCOUNTER — Encounter: Payer: Self-pay | Admitting: Podiatry

## 2019-06-18 VITALS — BP 147/94 | HR 59 | Temp 97.1°F | Resp 16

## 2019-06-18 DIAGNOSIS — M79672 Pain in left foot: Secondary | ICD-10-CM

## 2019-06-18 DIAGNOSIS — M722 Plantar fascial fibromatosis: Secondary | ICD-10-CM

## 2019-06-18 DIAGNOSIS — M79671 Pain in right foot: Secondary | ICD-10-CM | POA: Diagnosis not present

## 2019-06-18 DIAGNOSIS — M7730 Calcaneal spur, unspecified foot: Secondary | ICD-10-CM

## 2019-06-18 NOTE — Patient Instructions (Signed)

## 2019-06-19 NOTE — Progress Notes (Signed)
Subjective: 49 year old female presents the office today for concerns of bilateral heel pain, arch pain worsening on the last 2 months with the left side worse than the right.  She states that she gets pain in the morning when she first stands up and she also gets pain after working after her drive home and she stands back up.  Gets better with activity.  She feels that there is almost a "knot" in the arches of the feet.  Denies any radiating pain.  Does get some occasional pain to the ankles but she feels this is from compensation.  No weakness. Denies any systemic complaints such as fevers, chills, nausea, vomiting. No acute changes since last appointment, and no other complaints at this time.   Objective: AAO x3, NAD DP/PT pulses palpable bilaterally, CRT less than 3 seconds Tenderness to palpation along the plantar medial tubercle of the calcaneus at the insertion of plantar fascia on the left > right foot. There is mild pain along the course of the plantar fascia within the arch of the foot. Plantar fascia appears to be intact. There is no pain with lateral compression of the calcaneus or pain with vibratory sensation. There is no pain along the course or insertion of the achilles tendon. No other areas of tenderness to bilateral lower extremities. Negative tinel sign. No open lesions or pre-ulcerative lesions.  No pain with calf compression, swelling, warmth, erythema  Assessment: 49 year old female bilateral foot pain likely plantar fasciitis  Plan: -All treatment options discussed with the patient including all alternatives, risks, complications.  -X-rays were obtained and reviewed with the patient.  Heel spurs are present.  Mild increase in calcaneal pitch angle.  No evidence of acute fracture. -Steroid injection performed bilaterally. -Night splint dispensed x1 -Plantar fascial braces dispensed x2 -Discussed stretching, icing daily. -Cannot tolerate oral NSAIDs -Discussed shoe  modifications and orthotics- her shoes are a year old and she is going to look at getting new ones. We will check orthotic coverage and let her know.  -Patient encouraged to call the office with any questions, concerns, change in symptoms.   Procedure: Injection Tendon/Ligament Discussed alternatives, risks, complications and verbal consent was obtained.  Location: Bilateral plantar fascia at the glabrous junction; medial approach. Skin Prep: Alcohol. Injectate: 0.5cc 0.5% marcaine plain, 0.5 cc 2% lidocaine plain and, 1 cc kenalog 10. Disposition: Patient tolerated procedure well. Injection site dressed with a band-aid.  Post-injection care was discussed and return precautions discussed.   Return in about 3 weeks (around 07/09/2019) for platnar fasciitis .  Trula Slade DPM

## 2019-06-20 ENCOUNTER — Other Ambulatory Visit: Payer: Self-pay | Admitting: Podiatry

## 2019-06-20 DIAGNOSIS — M722 Plantar fascial fibromatosis: Secondary | ICD-10-CM

## 2019-07-09 ENCOUNTER — Ambulatory Visit: Payer: 59 | Admitting: Podiatry

## 2019-09-20 DIAGNOSIS — R1115 Cyclical vomiting syndrome unrelated to migraine: Secondary | ICD-10-CM | POA: Insufficient documentation

## 2020-01-01 ENCOUNTER — Encounter: Payer: Self-pay | Admitting: Podiatry

## 2020-01-01 ENCOUNTER — Ambulatory Visit: Payer: Medicaid Other | Admitting: Podiatry

## 2020-01-01 ENCOUNTER — Ambulatory Visit (INDEPENDENT_AMBULATORY_CARE_PROVIDER_SITE_OTHER): Payer: 59

## 2020-01-01 ENCOUNTER — Other Ambulatory Visit: Payer: Self-pay

## 2020-01-01 DIAGNOSIS — M778 Other enthesopathies, not elsewhere classified: Secondary | ICD-10-CM | POA: Diagnosis not present

## 2020-01-01 DIAGNOSIS — M79672 Pain in left foot: Secondary | ICD-10-CM | POA: Diagnosis not present

## 2020-01-01 DIAGNOSIS — S96919A Strain of unspecified muscle and tendon at ankle and foot level, unspecified foot, initial encounter: Secondary | ICD-10-CM | POA: Diagnosis not present

## 2020-01-01 DIAGNOSIS — M722 Plantar fascial fibromatosis: Secondary | ICD-10-CM

## 2020-01-08 NOTE — Progress Notes (Signed)
Subjective: 49 year old female presents the office today for concerns of bilateral foot pain with the left side worse than the right. She states that the pain has come back and is worsened. She states the last injection did help some but has come back forcibly she reports. She is currently in support but is not helped much. She is also tried a brace, stretching, without significant improvement. Denies any systemic complaints such as fevers, chills, nausea, vomiting. No acute changes since last appointment, and no other complaints at this time.   Objective: AAO x3, NAD DP/PT pulses palpable bilaterally, CRT less than 3 seconds There is continuation tenderness palpation of the tibial tubercle of the calcaneus at the insertion of the plantar fascia along the course of the plantar fascial origin of the foot. Occasional discomfort lateral aspect ankle on the peroneal tendon. No ankle instability is present. MMT 5/5. No pain with calf compression, swelling, warmth, erythema  Assessment: Chronic left heel pain, rule out plantar fascial tear, tendon tear  Plan: -All treatment options discussed with the patient including all alternatives, risks, complications.  -At this point given her prolonged nature of symptoms I recommended MRI which is ordered. For now continue with the brace, supportive shoes. MRI is for potential surgical planning. -Patient encouraged to call the office with any questions, concerns, change in symptoms.   Trula Slade DPM

## 2020-01-10 ENCOUNTER — Other Ambulatory Visit: Payer: Self-pay | Admitting: Podiatry

## 2020-01-10 ENCOUNTER — Telehealth: Payer: Self-pay | Admitting: *Deleted

## 2020-01-10 NOTE — Telephone Encounter (Signed)
[name] has faxed over to [facility] and the patient has an appointment on 01-30-2020. Beverly Elliott

## 2020-01-10 NOTE — Telephone Encounter (Signed)
-----   Message from Trula Slade, DPM sent at 01/08/2020  6:15 AM EDT ----- I had ordered an MRI. Can you follow-up to see if there was a present needed? Thank you.

## 2020-01-21 ENCOUNTER — Telehealth: Payer: Self-pay | Admitting: Podiatry

## 2020-01-22 ENCOUNTER — Encounter: Payer: Self-pay | Admitting: Podiatry

## 2020-01-22 NOTE — Telephone Encounter (Signed)
I have submitted a letter asking for reconsideration. Letter faxed with appropriate information today.

## 2020-01-30 ENCOUNTER — Other Ambulatory Visit: Payer: Self-pay

## 2020-01-30 ENCOUNTER — Ambulatory Visit
Admission: RE | Admit: 2020-01-30 | Discharge: 2020-01-30 | Disposition: A | Discharge status: Home / Self Care | Payer: 59 | Source: Ambulatory Visit | Attending: Podiatry | Admitting: Podiatry

## 2020-01-30 DIAGNOSIS — M722 Plantar fascial fibromatosis: Secondary | ICD-10-CM

## 2020-01-30 DIAGNOSIS — S96919A Strain of unspecified muscle and tendon at ankle and foot level, unspecified foot, initial encounter: Secondary | ICD-10-CM

## 2020-01-30 DIAGNOSIS — M79672 Pain in left foot: Secondary | ICD-10-CM

## 2020-01-30 IMAGING — MR MR ANKLE*L* W/O CM
5 series · 36 of 40 positions shown · non-contrast
Comparison: None.

CLINICAL DATA: Ankle pain, severe foot pain along the plantar
aspect and lateral aspect

EXAM:
MRI OF THE LEFT FOOT WITHOUT CONTRAST
MRI OF THE LEFT ANKLE WITHOUT CONTRAST
TECHNIQUE: Multiplanar, multisequence MR imaging of the left foot was
performed. No intravenous contrast was administered.
Multiplanar, multisequence MR imaging of the left ankle was

[Series 4: T2 fat-sat · axial · 3.0mm · 0.50mm/px · z∈[-110,+22]mm · 9 of 35 slices shown (1 of 2)]
[im 1/35]
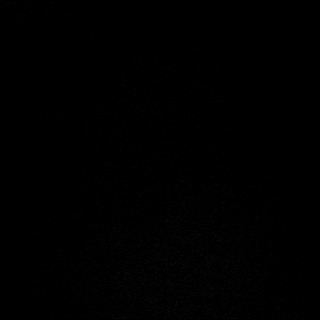
[im 5/35]
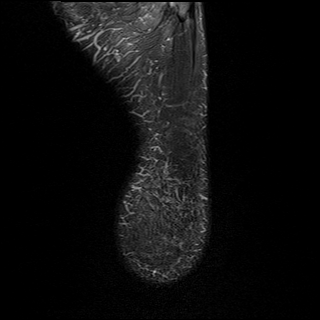
[im 9/35]
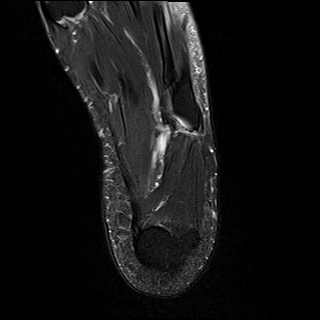
[im 13/35]
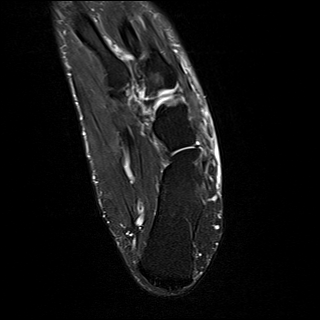
[im 18/35]
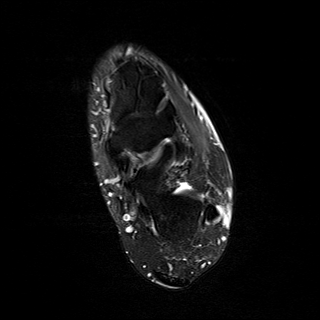
[im 22/35]
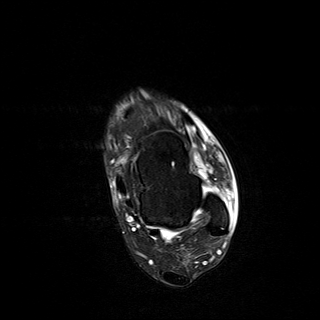
[im 26/35]
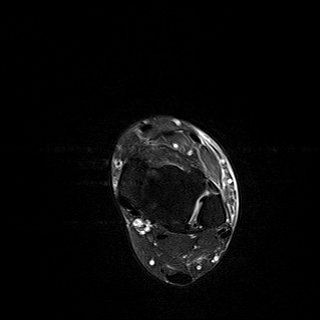
[im 30/35]
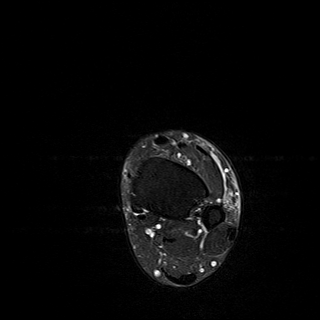
[im 35/35]
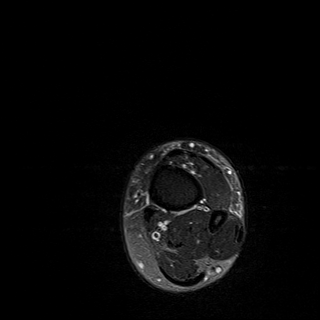

[Series 5: PD fat-sat · axial · 3.0mm · 0.42mm/px · z∈[-110,+22]mm · 9 of 35 slices shown]
[im 1/35]
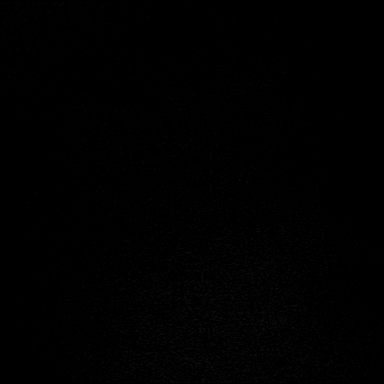
[im 5/35]
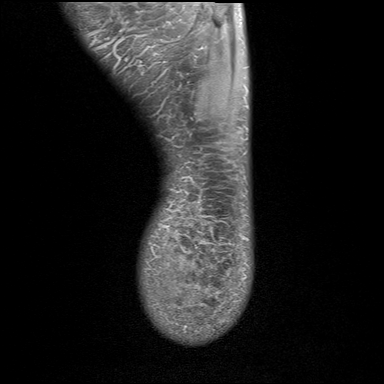
[im 9/35]
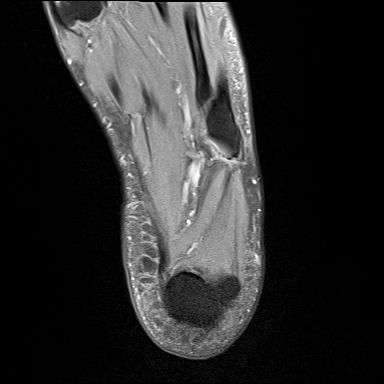
[im 13/35]
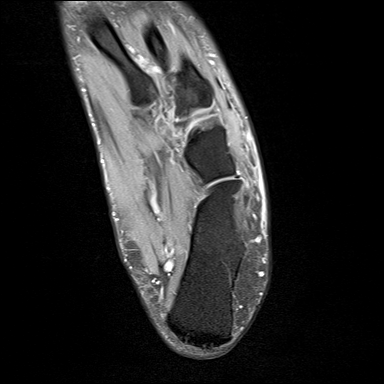
[im 18/35]
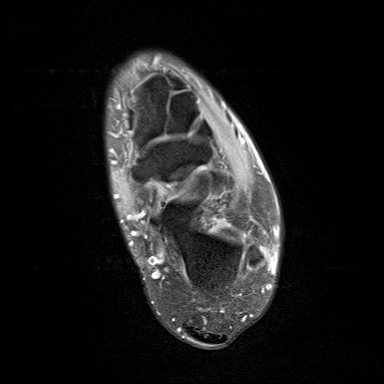
[im 22/35]
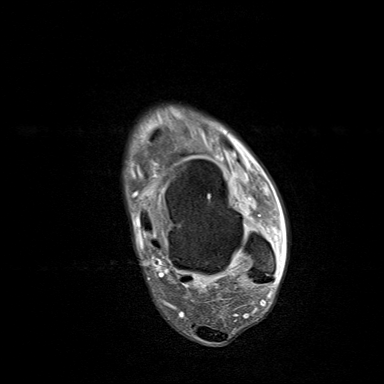
[im 26/35]
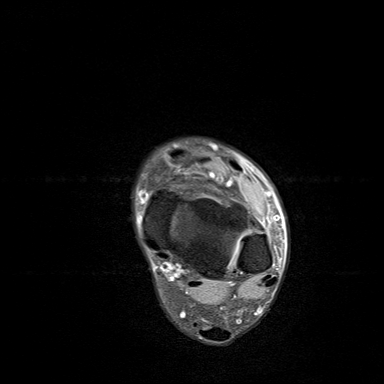
[im 30/35]
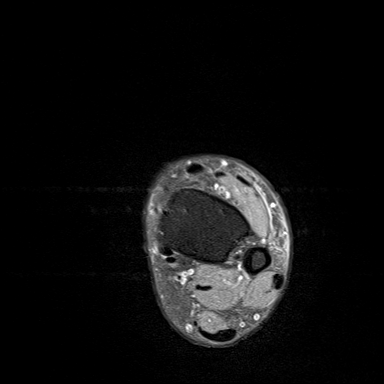
[im 35/35]
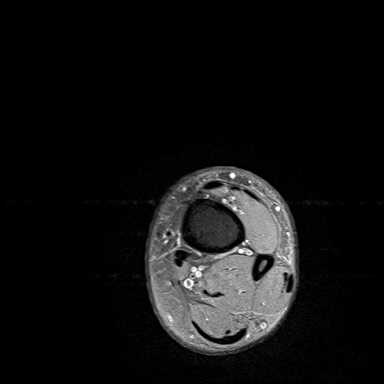

[Series 6: T1 · sagittal · 4.0mm · 0.56mm/px · 6 of 22 slices shown]
[im 1/22]
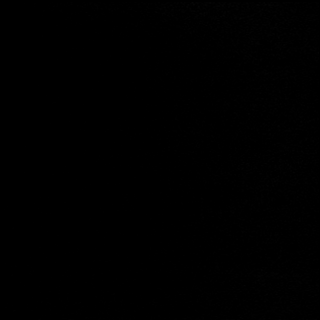
[im 5/22]
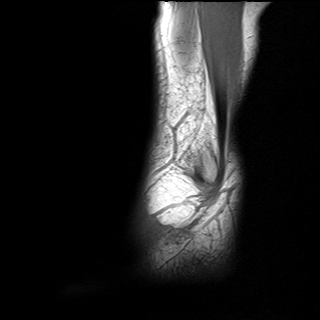
[im 9/22]
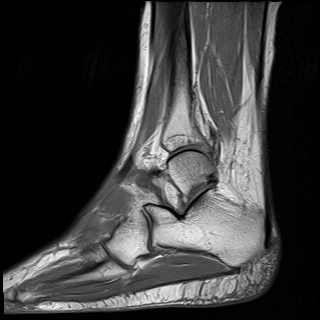
[im 13/22]
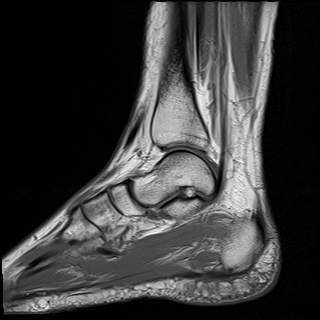
[im 17/22]
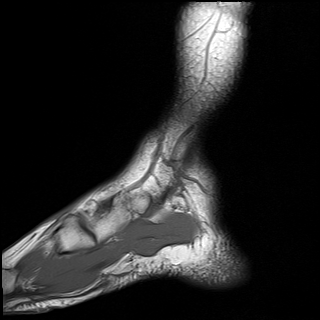
[im 22/22]
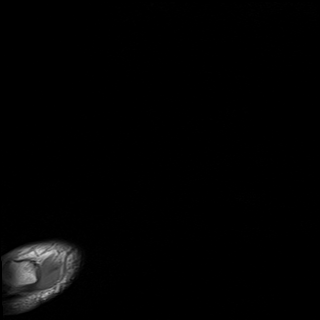

[Series 7: STIR · sagittal · 4.0mm · 0.35mm/px · 4 of 22 slices shown]
[im 1/22]
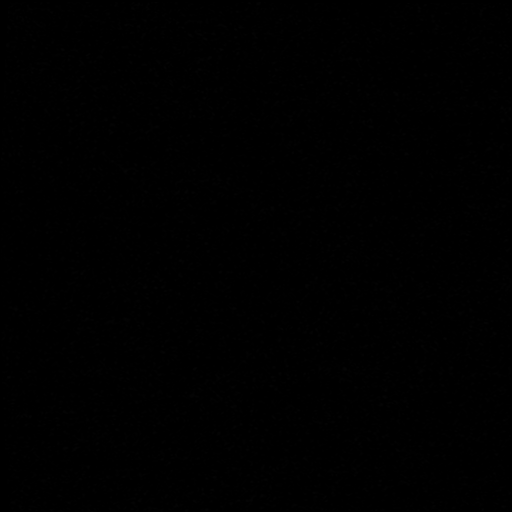
[im 5/22]
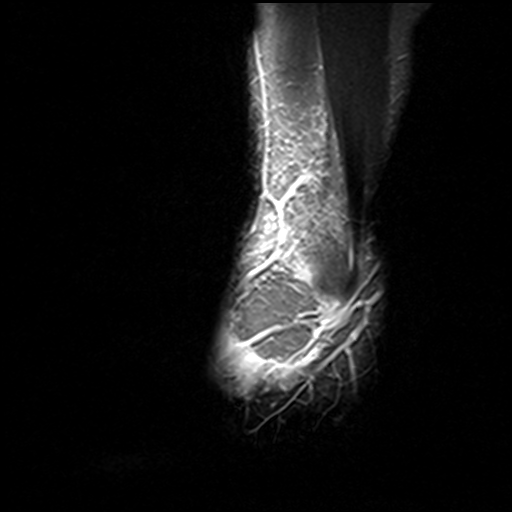
[im 9/22]
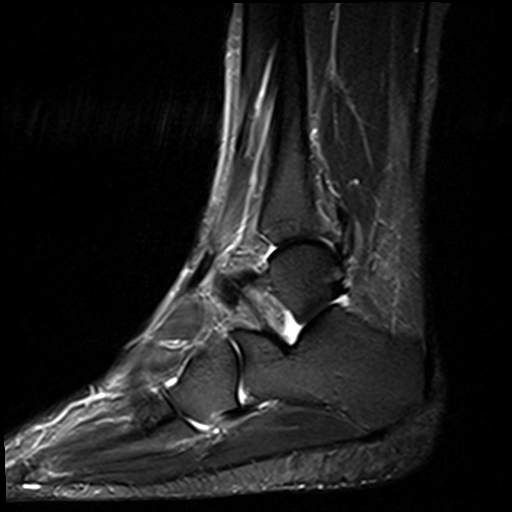
[im 13/22]
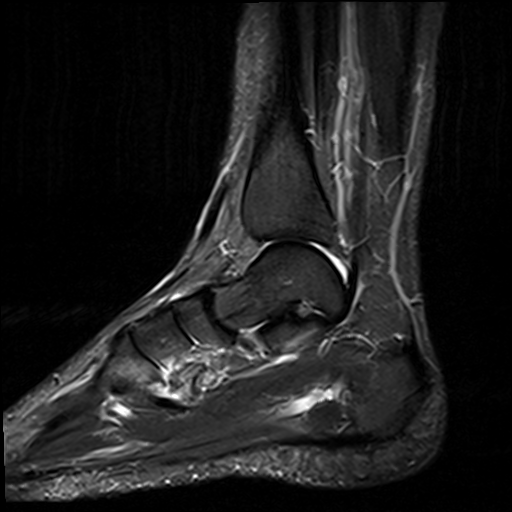

[Series 8: T2 fat-sat · coronal · 3.0mm · 0.50mm/px · 8 of 37 slices shown (2 of 2)]
[im 1/37]
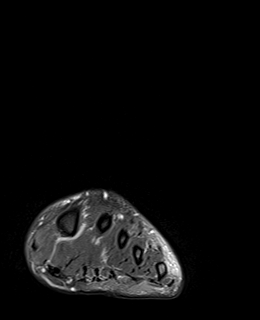
[im 5/37]
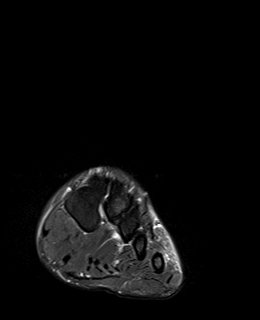
[im 13/37]
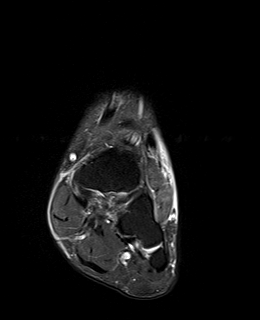
[im 17/37]
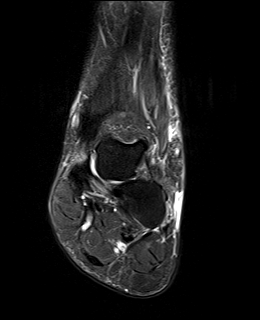
[im 21/37]
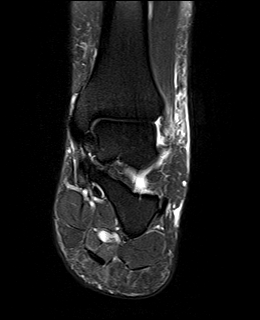
[im 25/37]
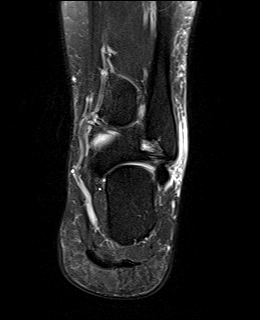
[im 33/37]
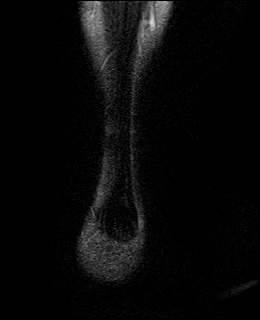
[im 37/37]
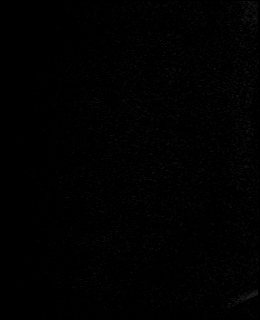

[36 of 40 positions shown; findings below may reference images not displayed]

FINDINGS: TENDONS

Peroneal: Mild tendinosis of the peroneal longus. Peroneal brevis
intact.

Posteromedial: Posterior tibial tendon intact. Flexor hallucis
longus tendon intact. Flexor digitorum longus tendon intact.

Anterior: Tibialis anterior tendon intact. Extensor hallucis longus
tendon intact Extensor digitorum longus tendon intact.

Achilles:  Intact.

Plantar Fascia: Intact. Tiny plantar calcaneal spur.

LIGAMENTS

Lateral: Anterior talofibular ligament intact. Calcaneofibular
ligament intact. Posterior talofibular ligament intact. Anterior and
posterior tibiofibular ligaments intact.

Medial: Deltoid ligament intact. Spring ligament intact.

CARTILAGE

Ankle Joint: No joint effusion. Normal ankle mortise. No chondral
defect.

Subtalar Joints/Sinus Tarsi: Normal subtalar joints. No subtalar
joint effusion. Normal sinus tarsi.

Bones: Mild osteoarthritis of the third TMT joint with subchondral
reactive marrow changes. No fracture or dislocation.

Soft Tissue: No fluid collection or hematoma. Muscles are normal
without edema or atrophy. Tarsal tunnel is normal.
IMPRESSION: 1. Mild tendinosis of the peroneal longus.
2. Mild osteoarthritis of the third TMT joint.

## 2020-01-30 IMAGING — MR MR FOOT*L* W/O CM
4 of 6 series · 16 of 40 positions shown · non-contrast
Comparison: None.

CLINICAL DATA: Ankle pain, severe foot pain along the plantar
aspect and lateral aspect

EXAM:
MRI OF THE LEFT FOOT WITHOUT CONTRAST
MRI OF THE LEFT ANKLE WITHOUT CONTRAST
TECHNIQUE: Multiplanar, multisequence MR imaging of the left foot was
performed. No intravenous contrast was administered.
Multiplanar, multisequence MR imaging of the left ankle was

[Series 3: T1 · coronal · 3.0mm · 0.19mm/px · 3 of 44 slices shown]
[im 8/44]
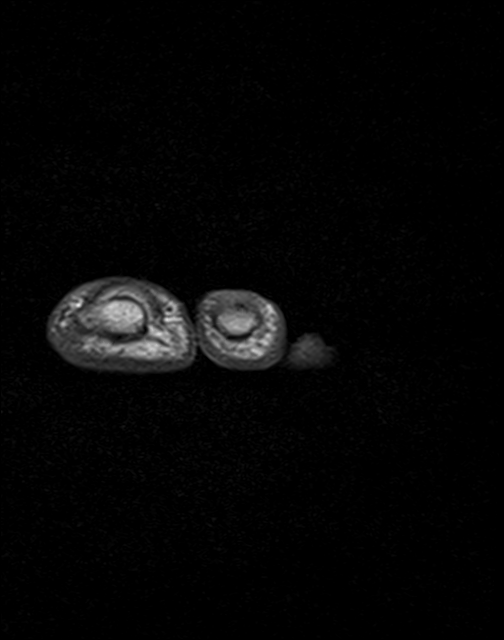
[im 22/44]
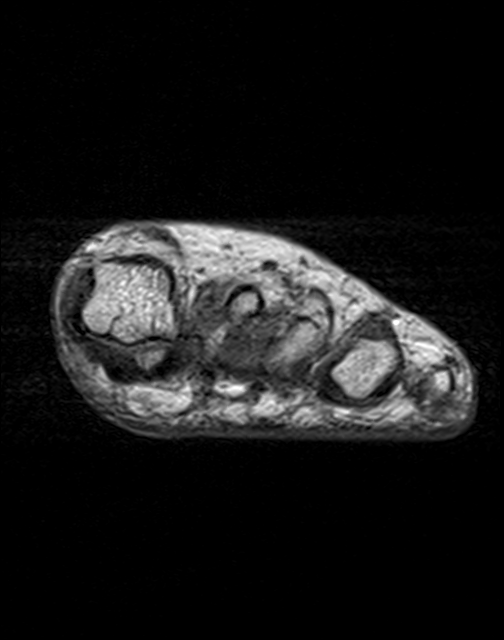
[im 36/44]
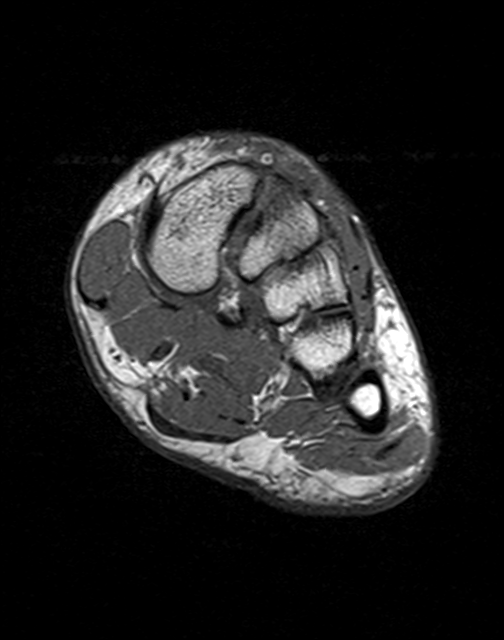

[Series 4: T2 fat-sat · coronal · 3.0mm · 0.19mm/px · 7 of 45 slices shown (1 of 3)]
[im 1/45]
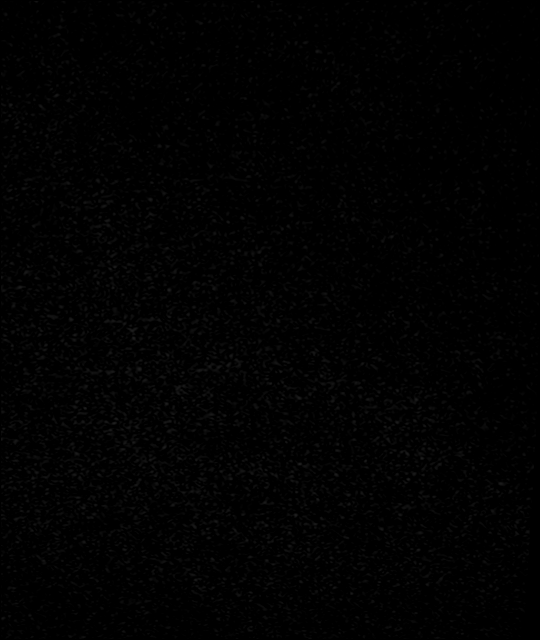
[im 6/45]
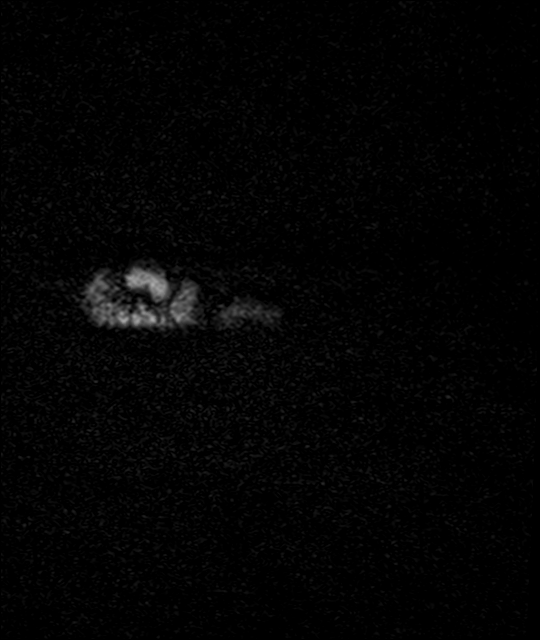
[im 12/45]
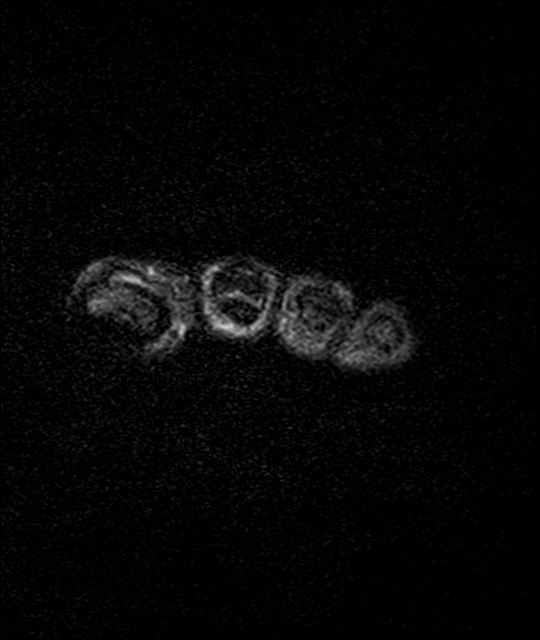
[im 17/45]
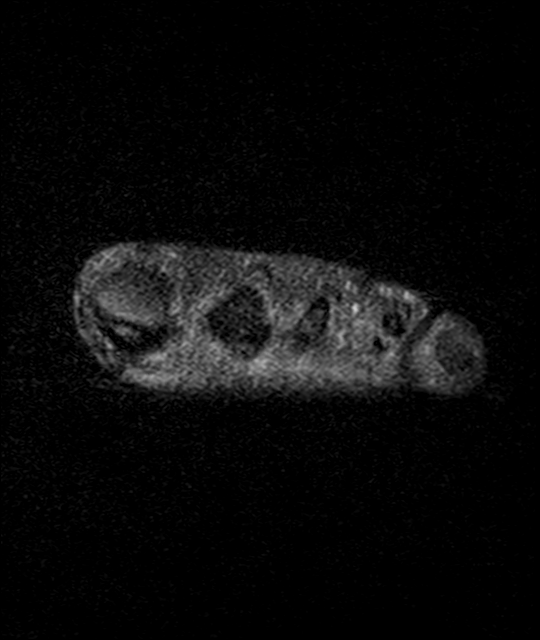
[im 23/45]
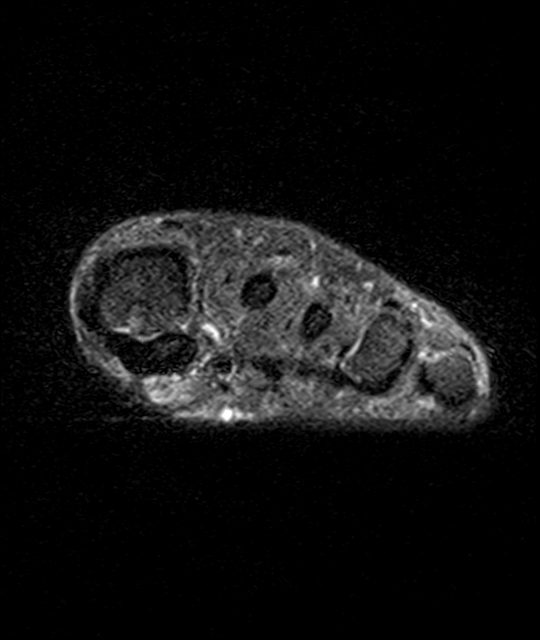
[im 28/45]
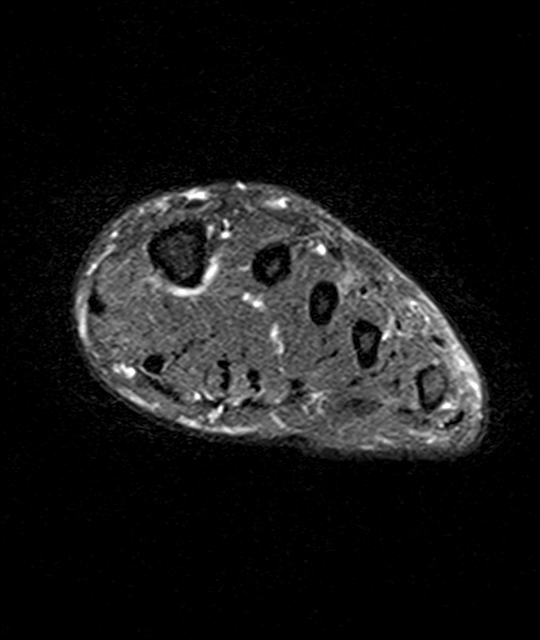
[im 39/45]
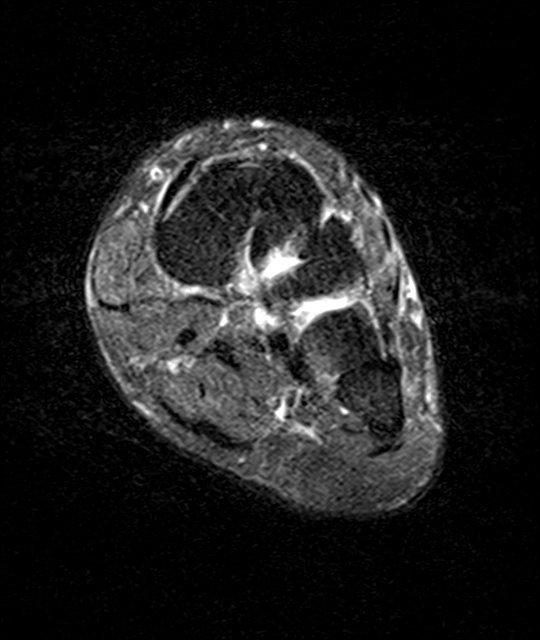

[Series 5: T2 fat-sat · axial · 3.0mm · 0.39mm/px · z∈[-135,-49]mm · 3 of 24 slices shown (2 of 3)]
[im 1/24]
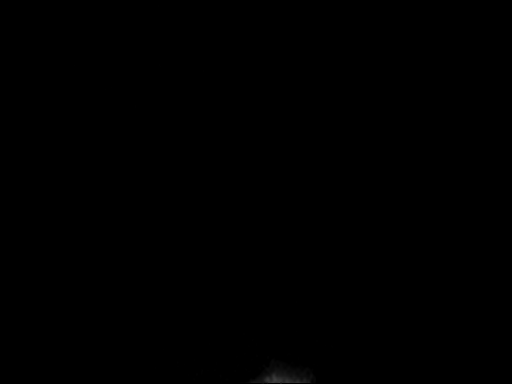
[im 12/24]
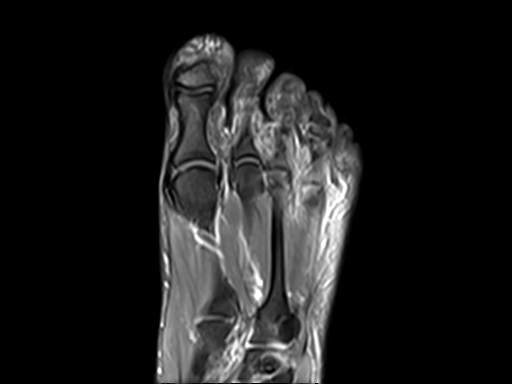
[im 24/24]
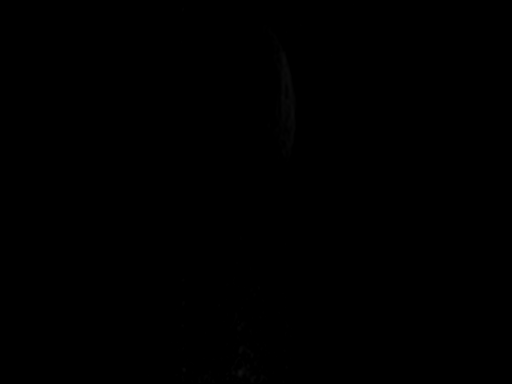

[Series 8: T2 fat-sat · coronal · 3.0mm · 0.19mm/px · 3 of 45 slices shown (3 of 3)]
[im 6/45]
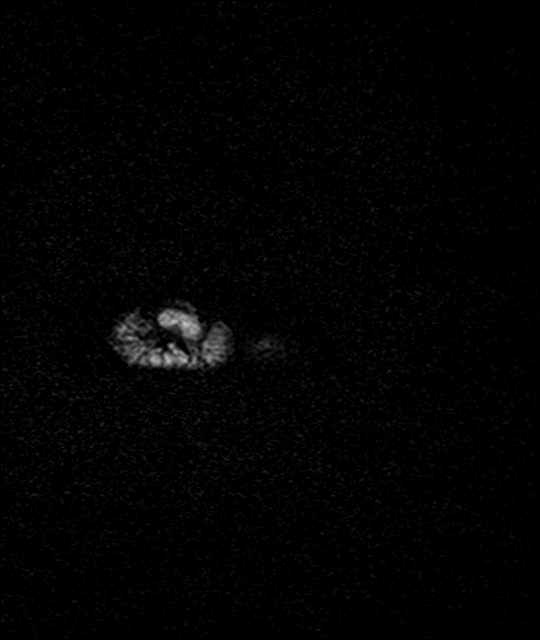
[im 23/45]
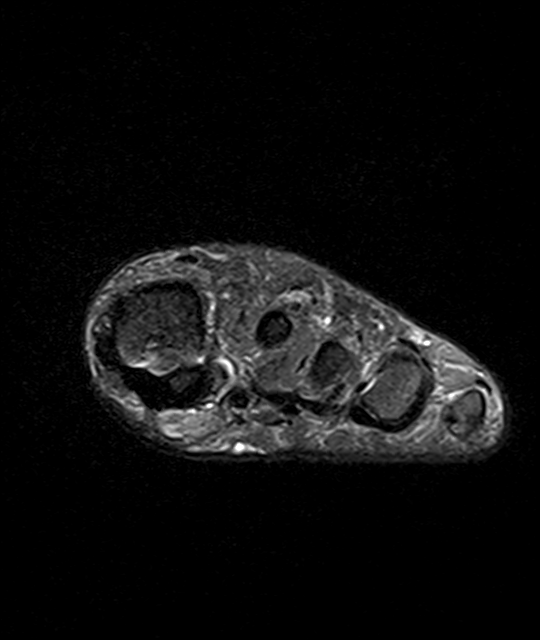
[im 39/45]
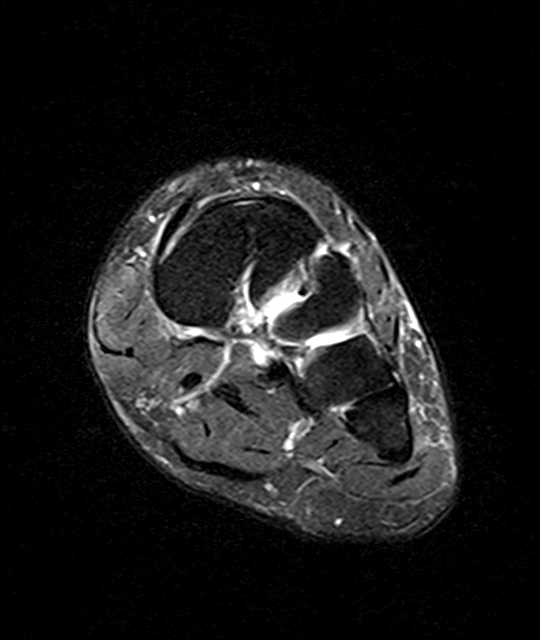

[16 of 40 positions shown; findings below may reference images not displayed]

FINDINGS: TENDONS

Peroneal: Mild tendinosis of the peroneal longus. Peroneal brevis
intact.

Posteromedial: Posterior tibial tendon intact. Flexor hallucis
longus tendon intact. Flexor digitorum longus tendon intact.

Anterior: Tibialis anterior tendon intact. Extensor hallucis longus
tendon intact Extensor digitorum longus tendon intact.

Achilles:  Intact.

Plantar Fascia: Intact. Tiny plantar calcaneal spur.

LIGAMENTS

Lateral: Anterior talofibular ligament intact. Calcaneofibular
ligament intact. Posterior talofibular ligament intact. Anterior and
posterior tibiofibular ligaments intact.

Medial: Deltoid ligament intact. Spring ligament intact.

CARTILAGE

Ankle Joint: No joint effusion. Normal ankle mortise. No chondral
defect.

Subtalar Joints/Sinus Tarsi: Normal subtalar joints. No subtalar
joint effusion. Normal sinus tarsi.

Bones: Mild osteoarthritis of the third TMT joint with subchondral
reactive marrow changes. No fracture or dislocation.

Soft Tissue: No fluid collection or hematoma. Muscles are normal
without edema or atrophy. Tarsal tunnel is normal.
IMPRESSION: 1. Mild tendinosis of the peroneal longus.
2. Mild osteoarthritis of the third TMT joint.

## 2020-09-01 ENCOUNTER — Ambulatory Visit (INDEPENDENT_AMBULATORY_CARE_PROVIDER_SITE_OTHER): Payer: 59

## 2020-09-01 ENCOUNTER — Ambulatory Visit (INDEPENDENT_AMBULATORY_CARE_PROVIDER_SITE_OTHER): Payer: 59 | Admitting: Podiatry

## 2020-09-01 ENCOUNTER — Encounter: Payer: Self-pay | Admitting: Podiatry

## 2020-09-01 ENCOUNTER — Other Ambulatory Visit: Payer: Self-pay

## 2020-09-01 DIAGNOSIS — M19072 Primary osteoarthritis, left ankle and foot: Secondary | ICD-10-CM | POA: Diagnosis not present

## 2020-09-01 DIAGNOSIS — M1990 Unspecified osteoarthritis, unspecified site: Secondary | ICD-10-CM

## 2020-09-01 DIAGNOSIS — M79672 Pain in left foot: Secondary | ICD-10-CM

## 2020-09-01 DIAGNOSIS — G8929 Other chronic pain: Secondary | ICD-10-CM | POA: Insufficient documentation

## 2020-09-01 DIAGNOSIS — M779 Enthesopathy, unspecified: Secondary | ICD-10-CM

## 2020-09-01 DIAGNOSIS — M7752 Other enthesopathy of left foot: Secondary | ICD-10-CM

## 2020-09-01 MED ORDER — TRIAMCINOLONE ACETONIDE 10 MG/ML IJ SUSP
10.0000 mg | Freq: Once | INTRAMUSCULAR | Status: AC
  Administered 2020-09-01: 10 mg

## 2020-09-05 NOTE — Progress Notes (Signed)
Subjective: 50 year old female presents the office today for follow-up evaluation of pain to her left foot.  She states that she has a cam boot at home but she has not worn it but she thinks that she is ready to do so.  She states that she has not seen any significant improvement since I last saw her in August 2021.  She denies any recent injury or falls or changes otherwise. Denies any systemic complaints such as fevers, chills, nausea, vomiting. No acute changes since last appointment, and no other complaints at this time.   Objective: AAO x3, NAD DP/PT pulses palpable bilaterally, CRT less than 3 seconds The majority tenderness is the dorsal aspect of the foot on the Lisfranc joint centrally.  There is no area of pinpoint tenderness there is no edema, erythema.  Flexor, extensor tendons appear to be intact.  No significant pain on the course or insertion of plantar fashion.  No pain with Achilles tendon.  MMT 5/5 No pain with calf compression, swelling, warmth, erythema  Assessment: Capsulitis left foot  Plan: -All treatment options discussed with the patient including all alternatives, risks, complications.  -X-rays were obtained and reviewed.  No evidence of acute fracture or stress fracture. -Steroid injection performed.  Skin was prepped with alcohol and mixture of 1 cc Kenalog 10, 0.5 cc of Marcaine plain, 0.5 cc lidocaine plain was infiltrated on the area maximal tenderness.  Postinjection care discussed.  Tolerated well. -Recommend immobilization in cam boot. -Patient encouraged to call the office with any questions, concerns, change in symptoms.   Beverly Elliott DPM

## 2020-09-29 ENCOUNTER — Ambulatory Visit: Payer: 59 | Admitting: Podiatry

## 2021-01-01 ENCOUNTER — Ambulatory Visit: Payer: 59 | Admitting: Podiatry

## 2021-01-06 DIAGNOSIS — G4733 Obstructive sleep apnea (adult) (pediatric): Secondary | ICD-10-CM | POA: Insufficient documentation

## 2021-01-06 DIAGNOSIS — G4719 Other hypersomnia: Secondary | ICD-10-CM | POA: Insufficient documentation

## 2021-02-03 ENCOUNTER — Ambulatory Visit: Payer: 59 | Admitting: Podiatry

## 2021-02-10 ENCOUNTER — Encounter: Payer: Self-pay | Admitting: Podiatry

## 2021-02-10 ENCOUNTER — Ambulatory Visit (INDEPENDENT_AMBULATORY_CARE_PROVIDER_SITE_OTHER): Payer: 59

## 2021-02-10 ENCOUNTER — Ambulatory Visit (INDEPENDENT_AMBULATORY_CARE_PROVIDER_SITE_OTHER): Payer: 59 | Admitting: Podiatry

## 2021-02-10 ENCOUNTER — Other Ambulatory Visit: Payer: Self-pay

## 2021-02-10 DIAGNOSIS — M7989 Other specified soft tissue disorders: Secondary | ICD-10-CM | POA: Diagnosis not present

## 2021-02-10 DIAGNOSIS — M7730 Calcaneal spur, unspecified foot: Secondary | ICD-10-CM | POA: Diagnosis not present

## 2021-02-10 DIAGNOSIS — M7661 Achilles tendinitis, right leg: Secondary | ICD-10-CM

## 2021-02-10 DIAGNOSIS — M792 Neuralgia and neuritis, unspecified: Secondary | ICD-10-CM

## 2021-02-10 DIAGNOSIS — M779 Enthesopathy, unspecified: Secondary | ICD-10-CM | POA: Diagnosis not present

## 2021-02-10 MED ORDER — MELOXICAM 7.5 MG PO TABS: 7.5000 mg | ORAL_TABLET | Freq: Every day | ORAL | 0 refills | Status: DC

## 2021-02-10 NOTE — Patient Instructions (Signed)

## 2021-02-15 NOTE — Progress Notes (Signed)
Subjective: 50 year old female presents the office today for follow-up evaluation of pain to feet.  She states that she gets a lot of pain to her feet she was seen cut them off.  She states that she gets significant itching sensation in both feet where she actually causes wounds to her feet.  She describes burning pain to her feet as well.   Per Care Everywhere: XR Lumbar spine (11/21/2020) FINDINGS:  Normal lumbar alignment. Negative for fracture, mass, or pars  defect.   Moderate disc degeneration L3-4 with disc space narrowing and  spurring. Advanced disc degeneration L4-5 and L5-S1 with disc space  narrowing and posterior osteophyte formation. Degenerative changes  have progressed since 2005.   IMPRESSION:  Progressive disc degeneration at lower 3 levels. No acute  abnormality.   Objective: AAO x3, NAD DP/PT pulses palpable bilaterally, CRT less than 3 seconds Superficial wounds present bilateral foot and ankles from where she has had excoriations from itching her feet.  There is no signs of any fungal infection or any drainage or pus or any bacterial infection. The majority tenderness is the dorsal aspect of the foot on the Lisfranc joint centrally.  Unable to elicit any area of pinpoint tenderness.  There is tenderness palpation of the posterior aspect of the heel on the right side along a small bone spur as well as the distal portion Achilles tendon.  Achilles tendon appears to be intact.  There is no pain with lateral compression of calcaneus.  There is no edema, erythema.  Equinus present.  There is no pain with metatarsal calcaneus.  There is no other area pinpoint tenderness. No pain with calf compression, swelling, warmth, erythema  Assessment: Capsulitis left foot; right heel pain, tendinitis; possible nerve impingement causing bilateral nerve issues  Plan: -All treatment options discussed with the patient including all alternatives, risks, complications.  -X-rays were  obtained and reviewed.  No evidence of acute fracture or stress fracture. -At this point we discussed physical therapy for her foot pain and unfortunately stress is not covering this.  Discussed exercises to perform at home.  Dispensed a night splint.  Also heel lifts.  Discussed meloxicam 7.5 mg to take intermittently as needed.  Icing daily.  Discussed shoes and good arch support. -She is already known to [facility]surgery [facility].  She said she has not discussed her lower extremity discomfort.  I do believe that some of her issues in her feet are nerve related possibly coming from her back.  Trula Slade DPM  Cc: Ronal Fear, PA-C [phone] (Work) [phone] (Fax) [address] [place], [place] [address] Physician Assistant

## 2021-03-04 ENCOUNTER — Other Ambulatory Visit: Payer: Self-pay | Admitting: Podiatry

## 2021-03-04 DIAGNOSIS — M779 Enthesopathy, unspecified: Secondary | ICD-10-CM

## 2021-03-24 ENCOUNTER — Ambulatory Visit: Payer: 59 | Admitting: Podiatry

## 2021-05-26 ENCOUNTER — Emergency Department (HOSPITAL_COMMUNITY)
Admission: EM | Admit: 2021-05-26 | Discharge: 2021-05-26 | Disposition: A | Discharge status: Home / Self Care | Payer: PRIVATE HEALTH INSURANCE | Attending: Emergency Medicine | Admitting: Emergency Medicine

## 2021-05-26 ENCOUNTER — Other Ambulatory Visit: Payer: Self-pay

## 2021-05-26 ENCOUNTER — Encounter (HOSPITAL_COMMUNITY): Payer: Self-pay | Admitting: Emergency Medicine

## 2021-05-26 DIAGNOSIS — R079 Chest pain, unspecified: Secondary | ICD-10-CM | POA: Insufficient documentation

## 2021-05-26 DIAGNOSIS — R000 Tachycardia, unspecified: Secondary | ICD-10-CM | POA: Diagnosis not present

## 2021-05-26 DIAGNOSIS — N179 Acute kidney failure, unspecified: Secondary | ICD-10-CM

## 2021-05-26 DIAGNOSIS — R112 Nausea with vomiting, unspecified: Secondary | ICD-10-CM | POA: Diagnosis present

## 2021-05-26 DIAGNOSIS — R739 Hyperglycemia, unspecified: Secondary | ICD-10-CM | POA: Insufficient documentation

## 2021-05-26 DIAGNOSIS — E876 Hypokalemia: Secondary | ICD-10-CM | POA: Insufficient documentation

## 2021-05-26 DIAGNOSIS — E860 Dehydration: Secondary | ICD-10-CM | POA: Diagnosis not present

## 2021-05-26 DIAGNOSIS — Z91040 Latex allergy status: Secondary | ICD-10-CM | POA: Diagnosis not present

## 2021-05-26 DIAGNOSIS — A084 Viral intestinal infection, unspecified: Secondary | ICD-10-CM | POA: Insufficient documentation

## 2021-05-26 LAB — COMPREHENSIVE METABOLIC PANEL
ALT: 43 U/L (ref 0–44)
AST: 49 U/L — ABNORMAL HIGH (ref 15–41)
Albumin: 4.4 g/dL (ref 3.5–5.0)
Alkaline Phosphatase: 63 U/L (ref 38–126)
Anion gap: 16 — ABNORMAL HIGH (ref 5–15)
BUN: 21 mg/dL — ABNORMAL HIGH (ref 6–20)
CO2: 18 mmol/L — ABNORMAL LOW (ref 22–32)
Calcium: 9.8 mg/dL (ref 8.9–10.3)
Chloride: 102 mmol/L (ref 98–111)
Creatinine, Ser: 1.42 mg/dL — ABNORMAL HIGH (ref 0.44–1.00)
GFR, Estimated: 46 mL/min — ABNORMAL LOW (ref >60)
Glucose, Bld: 228 mg/dL — ABNORMAL HIGH (ref 70–99)
Potassium: 2.8 mmol/L — ABNORMAL LOW (ref 3.5–5.1)
Sodium: 136 mmol/L (ref 135–145)
Total Bilirubin: 0.8 mg/dL (ref 0.3–1.2)
Total Protein: 7.7 g/dL (ref 6.5–8.1)

## 2021-05-26 LAB — I-STAT BETA HCG BLOOD, ED (MC, WL, AP ONLY): I-stat hCG, quantitative: 8.8 m[IU]/mL — ABNORMAL HIGH (ref <5)

## 2021-05-26 LAB — URINALYSIS, ROUTINE W REFLEX MICROSCOPIC
Bilirubin Urine: NEGATIVE
Glucose, UA: NEGATIVE mg/dL
Ketones, ur: 40 mg/dL — AB
Leukocytes,Ua: NEGATIVE
Nitrite: NEGATIVE
Protein, ur: 30 mg/dL — AB
Specific Gravity, Urine: >1.03 — ABNORMAL HIGH (ref 1.005–1.030)
pH: 6 (ref 5.0–8.0)

## 2021-05-26 LAB — URINALYSIS, MICROSCOPIC (REFLEX): WBC, UA: NONE SEEN WBC/hpf (ref 0–5)

## 2021-05-26 LAB — CBC
HCT: 49.4 % — ABNORMAL HIGH (ref 36.0–46.0)
Hemoglobin: 16.4 g/dL — ABNORMAL HIGH (ref 12.0–15.0)
MCH: 29.1 pg (ref 26.0–34.0)
MCHC: 33.2 g/dL (ref 30.0–36.0)
MCV: 87.7 fL (ref 80.0–100.0)
Platelets: 337 10*3/uL (ref 150–400)
RBC: 5.63 MIL/uL — ABNORMAL HIGH (ref 3.87–5.11)
RDW: 13.8 % (ref 11.5–15.5)
WBC: 10.3 10*3/uL (ref 4.0–10.5)
nRBC: 0 % (ref 0.0–0.2)

## 2021-05-26 LAB — TROPONIN I (HIGH SENSITIVITY): Troponin I (High Sensitivity): 30 ng/L — ABNORMAL HIGH (ref <18)

## 2021-05-26 LAB — LIPASE, BLOOD: Lipase: 25 U/L (ref 11–51)

## 2021-05-26 MED ORDER — KETOROLAC TROMETHAMINE 15 MG/ML IJ SOLN
15.0000 mg | Freq: Once | INTRAMUSCULAR | Status: AC
  Administered 2021-05-26: 15 mg via INTRAVENOUS
  Filled 2021-05-26: qty 1

## 2021-05-26 MED ORDER — DIPHENHYDRAMINE HCL 50 MG/ML IJ SOLN
25.0000 mg | Freq: Once | INTRAMUSCULAR | Status: AC
  Administered 2021-05-26: 25 mg via INTRAVENOUS
  Filled 2021-05-26: qty 1

## 2021-05-26 MED ORDER — POTASSIUM CHLORIDE CRYS ER 20 MEQ PO TBCR
40.0000 meq | EXTENDED_RELEASE_TABLET | Freq: Once | ORAL | Status: AC
  Administered 2021-05-26: 40 meq via ORAL
  Filled 2021-05-26: qty 2

## 2021-05-26 MED ORDER — ONDANSETRON 4 MG PO TBDP
4.0000 mg | ORAL_TABLET | Freq: Once | ORAL | Status: AC
  Administered 2021-05-26: 4 mg via ORAL
  Filled 2021-05-26: qty 1

## 2021-05-26 MED ORDER — SODIUM CHLORIDE 0.9 % IV SOLN
12.5000 mg | Freq: Once | INTRAVENOUS | Status: AC
  Administered 2021-05-26: 12.5 mg via INTRAVENOUS
  Filled 2021-05-26: qty 0.5

## 2021-05-26 MED ORDER — SODIUM CHLORIDE 0.9 % IV BOLUS
1000.0000 mL | Freq: Once | INTRAVENOUS | Status: AC
Start: 2021-05-26 — End: 2021-05-26
  Administered 2021-05-26: 1000 mL via INTRAVENOUS

## 2021-05-26 MED ORDER — PROMETHAZINE HCL 25 MG PO TABS: 25.0000 mg | ORAL_TABLET | Freq: Four times a day (QID) | ORAL | 0 refills | Status: DC | PRN

## 2021-05-26 NOTE — ED Provider Triage Note (Signed)
Emergency Medicine Provider Triage Evaluation Note  Beverly Elliott , a 51 y.o. female  was evaluated in triage.  Pt complains of abdominal pain, chest pain, and vomiting x 3 days.  Patient states she has these episodes about once a year, they normally resolve with a GI cocktail, IV promethazine, and IV fluids.  She states with her constant nausea she has been trying to insert her finger into the back of her throat to make her self vomit.  Review of Systems  Positive: As above Negative: Fever, chills  Physical Exam  BP (!) 186/101 (BP Location: Left Arm)    Pulse (!) 123    Temp 97.6 F (36.4 C) (Oral)    Resp (!) 34    SpO2 99%  Gen:   Awake, no distress   Resp:  Normal effort  MSK:   Moves extremities without difficulty  Other:  Actively vomiting in triage  Medical Decision Making  Medically screening exam initiated at 2:13 PM.  Appropriate orders placed.  Rashae Rother was informed that the remainder of the evaluation will be completed by another provider, this initial triage assessment does not replace that evaluation, and the importance of remaining in the ED until their evaluation is complete.     Kateri Plummer, PA-C 05/26/21 1415

## 2021-05-26 NOTE — ED Triage Notes (Signed)
Patient here with complaint of abdominal pain, chest pain, and emesis since Sunday. Patient states she has these episodes about one per year and they resolve with a GI cocktail, IV promethazine, and IV fluids. Patient alert, oriented, complaining of constant nausea and inserting finger into back of throat to induce vomiting.

## 2021-05-26 NOTE — ED Notes (Signed)
Pt given crackers and water for PO challenge

## 2021-05-26 NOTE — ED Notes (Signed)
Pt placed on 2L [place] due to o2 saturation 85% while sleeping. Pt states she has a hx of sleep apnea and wears cpap at night

## 2021-05-26 NOTE — ED Provider Notes (Signed)
[facility] EMERGENCY DEPARTMENT Provider Note   CSN: [id number] Arrival date & time: 05/26/21  1354     History  Chief Complaint  Patient presents with   Chest Pain    Beverly Elliott is a 51 y.o. female.  Patient is a 51 yo female presenting for nausea and vomiting that resulted in chest pain and abdominal pain after multiple episodes. Patient's symptoms started 2 days ago. Admits to subjective fevers and chills. Denies sore throat, nasal congestion, cough, or diarrhea.   The history is provided by the patient. No language interpreter was used.  Chest Pain Associated symptoms: abdominal pain, nausea and vomiting   Associated symptoms: no back pain, no cough, no fever, no palpitations and no shortness of breath       Home Medications Prior to Admission medications   Medication Sig Start Date End Date Taking? Authorizing Provider  amLODipine (NORVASC) 10 MG tablet Take by mouth. 09/20/19   [provider]  amLODipine (NORVASC) 10 MG tablet Take 1 tablet by mouth daily. 12/29/20   [provider]  amoxicillin (AMOXIL) 500 MG capsule Take 500 mg by mouth 4 (four) times daily. 08/07/20   [provider]  cetirizine (ZYRTEC) 10 MG tablet Take by mouth. 01/12/17 01/12/18  [provider]  chlorhexidine (PERIDEX) 0.12 % solution 15 mLs 2 (two) times daily. 10/26/19   [provider]  chlorthalidone (HYGROTON) 25 MG tablet Take 25 mg by mouth daily. 09/05/19   [provider]  clindamycin (CLEOCIN) 300 MG capsule Take 300 mg by mouth 3 (three) times daily. 10/23/19   [provider]  cyclobenzaprine (FLEXERIL) 10 MG tablet Take 1 tablet by mouth 2 (two) times daily as needed. 11/21/20   [provider]  dicyclomine (BENTYL) 20 MG tablet TAKE ONE TABLET BY MOUTH DAILY AS NEEDED (COLON SPASMS). 03/14/17   [provider]  dicyclomine (BENTYL) 20 MG tablet Take by mouth. 11/05/20   [provider]  doxycycline (VIBRA-TABS) 100 MG tablet Take 100 mg by mouth 2 (two) times daily. 12/31/20   [provider]  Escitalopram Oxalate (LEXAPRO PO) Take by mouth.    [provider]  estradiol (ESTRACE) 0.1 MG/GM vaginal cream Place vaginally. 12/07/19   [provider]  gabapentin (NEURONTIN) 300 MG capsule Take by mouth. 11/24/20   [provider]  HYDROcodone-acetaminophen (NORCO/VICODIN) 5-325 MG tablet Take 1 tablet by mouth every 6 (six) hours as needed. 10/26/19   [provider]  IRBESARTAN PO Take by mouth.    [provider]  lidocaine (XYLOCAINE) 2 % jelly Apply topically as needed. 12/12/19   [provider]  Magnesium (V-R MAGNESIUM) 250 MG TABS Take 1 tablet by mouth daily.    [provider]  meloxicam (MOBIC) 7.5 MG tablet Take 1 tablet (7.5 mg total) by mouth daily. 02/10/21   Trula Slade, DPM  metroNIDAZOLE (METROGEL) 0.75 % vaginal gel SMARTSIG:Applicator Vaginal Daily 12/07/19   [provider]  nitrofurantoin, macrocrystal-monohydrate, (MACROBID) 100 MG capsule Take 100 mg by mouth 2 (two) times daily. 12/12/19   [provider]  nystatin cream (MYCOSTATIN) Apply topically. 06/25/14   [provider]  ondansetron (ZOFRAN-ODT) 4 MG disintegrating tablet Take by mouth. 04/14/20   [provider]  pantoprazole (PROTONIX) 40 MG tablet Take 1 tablet by mouth daily. 07/30/20   [provider]  pantoprazole (PROTONIX) 40 MG tablet Take by mouth. 12/17/20   [provider]  Pantoprazole Sodium (PROTONIX  PO) Take by mouth.    [provider]  potassium chloride SA (KLOR-CON) 20 MEQ tablet Take by mouth. 07/02/19   [provider]  promethazine (PHENERGAN) 25 MG tablet Take by mouth. 09/29/19   [provider]  tiZANidine (ZANAFLEX) 4 MG tablet Take by mouth. 11/24/20   [provider]      Allergies    Latex, Metoclopramide,  Metoclopramide hcl, and Tape    Review of Systems   Review of Systems  Constitutional:  Negative for chills and fever.  HENT:  Negative for ear pain and sore throat.   Eyes:  Negative for pain and visual disturbance.  Respiratory:  Negative for cough and shortness of breath.   Cardiovascular:  Positive for chest pain. Negative for palpitations.  Gastrointestinal:  Positive for abdominal pain, nausea and vomiting.  Genitourinary:  Negative for dysuria and hematuria.  Musculoskeletal:  Negative for arthralgias and back pain.  Skin:  Negative for color change and rash.  Neurological:  Negative for seizures and syncope.  All other systems reviewed and are negative.  Physical Exam Updated Vital Signs BP (!) 153/115 (BP Location: Left Arm)    Pulse (!) 111    Temp 99.4 F (37.4 C) (Oral)    Resp (!) 24    SpO2 98%  Physical Exam Vitals and nursing note reviewed.  Constitutional:      General: She is not in acute distress.    Appearance: She is well-developed.  HENT:     Head: Normocephalic and atraumatic.  Eyes:     Conjunctiva/sclera: Conjunctivae normal.  Cardiovascular:     Rate and Rhythm: Regular rhythm. Tachycardia present.     Heart sounds: No murmur heard. Pulmonary:     Effort: Pulmonary effort is normal. Tachypnea present. No respiratory distress.     Breath sounds: Normal breath sounds.  Abdominal:     Palpations: Abdomen is soft.     Tenderness: There is no abdominal tenderness. There is no guarding or rebound. Negative signs include McBurney's sign.     Comments: No reproducible abdominal pain Patient actively vomiting  Musculoskeletal:        General: No swelling.     Cervical back: Neck supple.  Skin:    General: Skin is warm and dry.     Capillary Refill: Capillary refill takes less than 2 seconds.  Neurological:     Mental Status: She is alert.  Psychiatric:        Mood and Affect: Mood normal.    ED Results / Procedures / Treatments   Labs (all labs  ordered are listed, but only abnormal results are displayed) Labs Reviewed  COMPREHENSIVE METABOLIC PANEL - Abnormal; Notable for the following components:      Result Value   Potassium 2.8 (*)    CO2 18 (*)    Glucose, Bld 228 (*)    BUN 21 (*)    Creatinine, Ser 1.42 (*)    AST 49 (*)    GFR, Estimated 46 (*)    Anion gap 16 (*)    All other components within normal limits  CBC - Abnormal; Notable for the following components:   RBC 5.63 (*)    Hemoglobin 16.4 (*)    HCT 49.4 (*)    All other components within normal limits  I-STAT BETA HCG BLOOD, ED ([facility], [facility], [facility] ONLY) - Abnormal; Notable for the following components:   I-stat hCG, quantitative 8.8 (*)    All other components within  normal limits  LIPASE, BLOOD  URINALYSIS, ROUTINE W REFLEX MICROSCOPIC    EKG None  Radiology No results found.  Procedures Procedures    Medications Ordered in ED Medications  sodium chloride 0.9 % bolus 1,000 mL (has no administration in time range)  ketorolac (TORADOL) 15 MG/ML injection 15 mg (has no administration in time range)  promethazine (PHENERGAN) 12.5 mg in sodium chloride 0.9 % 50 mL IVPB (has no administration in time range)  diphenhydrAMINE (BENADRYL) injection 25 mg (has no administration in time range)  ondansetron (ZOFRAN-ODT) disintegrating tablet 4 mg (4 mg Oral Given 05/26/21 1418)    ED Course/ Medical Decision Making/ A&P                           Medical Decision Making Risk Prescription drug management.   6:35 PM 51 yo female presenting for nausea and vomiting that resulted in chest pain and abdominal pain after multiple episodes. Pt is Aox3, no acute distress, afebrile, with stable vitals. Abdomen is soft and non tender. Actively vomiting on exam. IV fluids, fenagrin, and benadryl given.   Stable ECG with no ST segment elevation or depression. Troponin wnl.  Patient in no distress and overall condition improved here in the ED. No RLQ abdominal pain or  reproducible abdominal pain. Doubt appendicitis. I see no indication for intraabdominal imaging at this time. Covid/influenza negative. No s/s sepsis. Abnormal labs likely secondary to dehydration and vomiting x 2 days. Detailed discussions were had with the patient regarding current findings, and need for close f/u with PCP or on call doctor. Patient's symptoms likely due to vial gastritis. Prescriptions for symptomatic relief sent to pharmacy. Patient able to tolerate PO prior to DC.  The patient has been instructed to return immediately if the symptoms worsen in any way for re-evaluation. Patient verbalized understanding and is in agreement with current care plan. All questions answered prior to discharge.         Final Clinical Impression(s) / ED Diagnoses Final diagnoses:  Hyperglycemia  AKI (acute kidney injury) ([place])  Dehydration  Nausea and vomiting, unspecified vomiting type  Hypokalemia  Viral gastroenteritis    Rx / DC Orders ED Discharge Orders     None         Lianne Cure, DO 95/18/84 1255

## 2021-05-26 NOTE — ED Notes (Signed)
Pt was able to keep food and water down without nausea or pain.

## 2021-05-27 ENCOUNTER — Encounter (HOSPITAL_COMMUNITY): Payer: Self-pay | Admitting: *Deleted

## 2021-05-27 ENCOUNTER — Emergency Department (HOSPITAL_COMMUNITY)
Admission: EM | Admit: 2021-05-27 | Discharge: 2021-05-28 | Disposition: A | Discharge status: Home / Self Care | Payer: PRIVATE HEALTH INSURANCE | Attending: Emergency Medicine | Admitting: Emergency Medicine

## 2021-05-27 ENCOUNTER — Other Ambulatory Visit: Payer: Self-pay

## 2021-05-27 DIAGNOSIS — E876 Hypokalemia: Secondary | ICD-10-CM | POA: Diagnosis not present

## 2021-05-27 DIAGNOSIS — R1013 Epigastric pain: Secondary | ICD-10-CM | POA: Insufficient documentation

## 2021-05-27 DIAGNOSIS — Z91040 Latex allergy status: Secondary | ICD-10-CM | POA: Diagnosis not present

## 2021-05-27 DIAGNOSIS — R079 Chest pain, unspecified: Secondary | ICD-10-CM | POA: Diagnosis not present

## 2021-05-27 DIAGNOSIS — R109 Unspecified abdominal pain: Secondary | ICD-10-CM

## 2021-05-27 DIAGNOSIS — R197 Diarrhea, unspecified: Secondary | ICD-10-CM | POA: Diagnosis not present

## 2021-05-27 DIAGNOSIS — Z20822 Contact with and (suspected) exposure to covid-19: Secondary | ICD-10-CM | POA: Insufficient documentation

## 2021-05-27 DIAGNOSIS — R112 Nausea with vomiting, unspecified: Secondary | ICD-10-CM | POA: Diagnosis not present

## 2021-05-27 LAB — CBC WITH DIFFERENTIAL/PLATELET
Abs Immature Granulocytes: 0.09 10*3/uL — ABNORMAL HIGH (ref 0.00–0.07)
Basophils Absolute: 0.1 10*3/uL (ref 0.0–0.1)
Basophils Relative: 0 %
Eosinophils Absolute: 0.1 10*3/uL (ref 0.0–0.5)
Eosinophils Relative: 0 %
HCT: 45.9 % (ref 36.0–46.0)
Hemoglobin: 15.7 g/dL — ABNORMAL HIGH (ref 12.0–15.0)
Immature Granulocytes: 1 %
Lymphocytes Relative: 25 %
Lymphs Abs: 3.4 10*3/uL (ref 0.7–4.0)
MCH: 29.8 pg (ref 26.0–34.0)
MCHC: 34.2 g/dL (ref 30.0–36.0)
MCV: 87.1 fL (ref 80.0–100.0)
Monocytes Absolute: 0.7 10*3/uL (ref 0.1–1.0)
Monocytes Relative: 5 %
Neutro Abs: 9.3 10*3/uL — ABNORMAL HIGH (ref 1.7–7.7)
Neutrophils Relative %: 69 %
Platelets: 312 10*3/uL (ref 150–400)
RBC: 5.27 MIL/uL — ABNORMAL HIGH (ref 3.87–5.11)
RDW: 13.9 % (ref 11.5–15.5)
WBC: 13.6 10*3/uL — ABNORMAL HIGH (ref 4.0–10.5)
nRBC: 0 % (ref 0.0–0.2)

## 2021-05-27 LAB — URINALYSIS, ROUTINE W REFLEX MICROSCOPIC
Bilirubin Urine: NEGATIVE
Glucose, UA: NEGATIVE mg/dL
Ketones, ur: 15 mg/dL — AB
Leukocytes,Ua: NEGATIVE
Nitrite: NEGATIVE
Protein, ur: NEGATIVE mg/dL
Specific Gravity, Urine: >1.03 — ABNORMAL HIGH (ref 1.005–1.030)
pH: 6 (ref 5.0–8.0)

## 2021-05-27 LAB — URINALYSIS, MICROSCOPIC (REFLEX)

## 2021-05-27 LAB — COMPREHENSIVE METABOLIC PANEL
ALT: 37 U/L (ref 0–44)
AST: 36 U/L (ref 15–41)
Albumin: 4.2 g/dL (ref 3.5–5.0)
Alkaline Phosphatase: 59 U/L (ref 38–126)
Anion gap: 11 (ref 5–15)
BUN: 14 mg/dL (ref 6–20)
CO2: 21 mmol/L — ABNORMAL LOW (ref 22–32)
Calcium: 8.8 mg/dL — ABNORMAL LOW (ref 8.9–10.3)
Chloride: 104 mmol/L (ref 98–111)
Creatinine, Ser: 1.03 mg/dL — ABNORMAL HIGH (ref 0.44–1.00)
GFR, Estimated: >60 mL/min (ref >60)
Glucose, Bld: 151 mg/dL — ABNORMAL HIGH (ref 70–99)
Potassium: 2.8 mmol/L — ABNORMAL LOW (ref 3.5–5.1)
Sodium: 136 mmol/L (ref 135–145)
Total Bilirubin: 0.8 mg/dL (ref 0.3–1.2)
Total Protein: 7.4 g/dL (ref 6.5–8.1)

## 2021-05-27 LAB — I-STAT BETA HCG BLOOD, ED (MC, WL, AP ONLY): I-stat hCG, quantitative: 10.5 m[IU]/mL — ABNORMAL HIGH (ref <5)

## 2021-05-27 LAB — RESP PANEL BY RT-PCR (FLU A&B, COVID) ARPGX2
Influenza A by PCR: NEGATIVE
Influenza B by PCR: NEGATIVE
SARS Coronavirus 2 by RT PCR: NEGATIVE

## 2021-05-27 MED ORDER — ONDANSETRON 4 MG PO TBDP
8.0000 mg | ORAL_TABLET | Freq: Once | ORAL | Status: AC
  Administered 2021-05-27: 8 mg via ORAL
  Filled 2021-05-27: qty 2

## 2021-05-27 NOTE — ED Triage Notes (Signed)
The pt is hyperventilating and is c/o numbness and tingling in her hands and arms

## 2021-05-27 NOTE — ED Triage Notes (Signed)
The pt has been vomiting since  Friday  she was here last pm and they wanyed to admit her but she chose to go home  the vomiting continues

## 2021-05-27 NOTE — ED Provider Triage Note (Signed)
Emergency Medicine Provider Triage Evaluation Note  Beverly Elliott , a 51 y.o. female  was evaluated in triage.  Pt complains of nausea, vomiting, and diarrhea.  Patient states she has a history of cyclic vomiting.  She states that she started smoking marijuana again and quit about 2 weeks ago.  She states that about 3 days ago she began experiencing intractable nausea and vomiting.  She states that the only thing that relieves her symptoms is IV Phenergan.  She states that she started experiencing diarrhea earlier today as well.  Denies any hematemesis or hematochezia.  Denies any chest pain.  Physical Exam  BP (!) 179/130    Pulse 95    Temp 99 F (37.2 C)    Resp (!) 28    Ht 5\' 2"  (1.575 m)    Wt 91.6 kg    SpO2 95%    BMI 36.94 kg/m  Gen:   Awake, no distress   Resp:  Normal effort  MSK:   Moves extremities without difficulty  Other:    Medical Decision Making  Medically screening exam initiated at 10:09 PM.  Appropriate orders placed.  Beverly Elliott was informed that the remainder of the evaluation will be completed by another provider, this initial triage assessment does not replace that evaluation, and the importance of remaining in the ED until their evaluation is complete.   Beverly Sexton, PA-C 05/27/21 2210

## 2021-05-28 LAB — MAGNESIUM: Magnesium: 1.8 mg/dL (ref 1.7–2.4)

## 2021-05-28 MED ORDER — PROMETHAZINE HCL 25 MG PO TABS
25.0000 mg | ORAL_TABLET | Freq: Once | ORAL | Status: AC
Start: 2021-05-28 — End: 2021-05-28
  Administered 2021-05-28: 25 mg via ORAL
  Filled 2021-05-28: qty 1

## 2021-05-28 MED ORDER — PANTOPRAZOLE SODIUM 40 MG PO TBEC: 40.0000 mg | DELAYED_RELEASE_TABLET | Freq: Every day | ORAL | 0 refills | Status: DC

## 2021-05-28 MED ORDER — POTASSIUM CHLORIDE 10 MEQ/100ML IV SOLN
10.0000 meq | Freq: Once | INTRAVENOUS | Status: AC
  Administered 2021-05-28: 10 meq via INTRAVENOUS
  Filled 2021-05-28: qty 100

## 2021-05-28 MED ORDER — POTASSIUM CHLORIDE CRYS ER 20 MEQ PO TBCR: 40.0000 meq | EXTENDED_RELEASE_TABLET | Freq: Two times a day (BID) | ORAL | 0 refills | Status: DC

## 2021-05-28 MED ORDER — ALUM & MAG HYDROXIDE-SIMETH 400-400-40 MG/5ML PO SUSP: 15.0000 mL | Freq: Four times a day (QID) | ORAL | 0 refills | Status: DC | PRN

## 2021-05-28 MED ORDER — POTASSIUM CHLORIDE CRYS ER 20 MEQ PO TBCR
80.0000 meq | EXTENDED_RELEASE_TABLET | Freq: Once | ORAL | Status: AC
Start: 2021-05-28 — End: 2021-05-28
  Administered 2021-05-28: 80 meq via ORAL
  Filled 2021-05-28: qty 4

## 2021-05-28 MED ORDER — PROCHLORPERAZINE EDISYLATE 10 MG/2ML IJ SOLN
10.0000 mg | Freq: Once | INTRAMUSCULAR | Status: AC
  Administered 2021-05-28: 10 mg via INTRAVENOUS
  Filled 2021-05-28: qty 2

## 2021-05-28 MED ORDER — POTASSIUM CHLORIDE 10 MEQ/100ML IV SOLN
10.0000 meq | Freq: Once | INTRAVENOUS | Status: AC
Start: 2021-05-28 — End: 2021-05-28
  Administered 2021-05-28: 10 meq via INTRAVENOUS
  Filled 2021-05-28: qty 100

## 2021-05-28 MED ORDER — LACTATED RINGERS IV BOLUS: 1000.0000 mL | Freq: Once | INTRAVENOUS | Status: DC

## 2021-05-28 MED ORDER — LACTATED RINGERS IV BOLUS
1000.0000 mL | Freq: Once | INTRAVENOUS | Status: AC
  Administered 2021-05-28: 1000 mL via INTRAVENOUS

## 2021-05-28 MED ORDER — MAGNESIUM SULFATE 2 GM/50ML IV SOLN
2.0000 g | Freq: Once | INTRAVENOUS | Status: AC
  Administered 2021-05-28: 2 g via INTRAVENOUS
  Filled 2021-05-28: qty 50

## 2021-05-28 NOTE — ED Notes (Signed)
Pt states she feels much better. Pt ambulatory to the bathroom and back to room without assistance. Family at bedside. MD notified. Pt asking for drink and crackers. Pt given sprite and crackers at this time. Will continue to monitor.

## 2021-05-28 NOTE — ED Provider Notes (Signed)
[facility] EMERGENCY DEPARTMENT Provider Note   CSN: [id number] Arrival date & time: 05/27/21  2139     History  Chief Complaint  Patient presents with   Abdominal Pain    Beverly Elliott is a 51 y.o. female.  51 year old female that presents to the emergency department today with vomiting and abdominal pain.  Patient was here yesterday for similar symptoms.  She felt better before going home but then tonight it came back again.  She is unsure why.  She is some burning in her epigastric area in her chest and abdomen but no severe pain.  Review of the records yesterday she did have an AKI and hypokalemia.  She received fluids, Benadryl, Toradol and Zofran.  Sounds like she ate normally throughout the day.  No spicy or fatty meals prior to this event.  No fevers.  Had an episode of diarrhea but no constipation.   Abdominal Pain     Home Medications Prior to Admission medications   Medication Sig Start Date End Date Taking? Authorizing Provider  alum & mag hydroxide-simeth (MAALOX PLUS) 400-400-40 MG/5ML suspension Take 15 mLs by mouth every 6 (six) hours as needed for indigestion. 05/28/21  Yes Kingston Guiles, Corene Cornea, MD  dicyclomine (BENTYL) 20 MG tablet Take 20 mg by mouth daily as needed for spasms. 03/14/17  Yes [provider]  escitalopram (LEXAPRO) 20 MG tablet Take 20 mg by mouth at bedtime. 05/08/21  Yes [provider]  gabapentin (NEURONTIN) 300 MG capsule Take 300 mg by mouth 3 (three) times daily as needed (pain). 11/24/20  Yes [provider]  potassium chloride SA (KLOR-CON M) 20 MEQ tablet Take 2 tablets (40 mEq total) by mouth 2 (two) times daily for 7 days. 05/28/21 06/04/21 Yes Donise Woodle, Corene Cornea, MD  pravastatin (PRAVACHOL) 40 MG tablet Take 40 mg by mouth at bedtime. 05/10/21  Yes [provider]  promethazine (PHENERGAN) 25 MG tablet Take 1 tablet (25 mg total) by mouth every 6 (six) hours as needed for nausea or vomiting. 1/82/99   Yes Campbell Stall P, DO  tiZANidine (ZANAFLEX) 4 MG tablet Take 4 mg by mouth every 8 (eight) hours as needed for muscle spasms. 11/24/20  Yes [provider]  amLODipine (NORVASC) 10 MG tablet Take 10 mg by mouth at bedtime. 12/29/20   [provider]  EQ ASPIRIN ADULT LOW DOSE 81 MG EC tablet Take 81 mg by mouth daily. 04/15/21   [provider]  magnesium oxide (MAG-OX) 400 (240 Mg) MG tablet Take 400 mg by mouth at bedtime. 04/15/21   [provider]  nebivolol (BYSTOLIC) 5 MG tablet Take 2.5 mg by mouth daily. 05/25/21   [provider]  pantoprazole (PROTONIX) 40 MG tablet Take 1 tablet (40 mg total) by mouth at bedtime. 05/28/21   Izabel Chim, Corene Cornea, MD      Allergies    Latex, Metoclopramide, Metoclopramide hcl, and Tape    Review of Systems   Review of Systems  Gastrointestinal:  Positive for abdominal pain.   Physical Exam Updated Vital Signs BP 105/67    Pulse 75    Temp 98 F (36.7 C)    Resp 20    Ht 5\' 2"  (1.575 m)    Wt 91.6 kg    SpO2 94%    BMI 36.94 kg/m  Physical Exam  ED Results / Procedures / Treatments   Labs (all labs ordered are listed, but only abnormal results are displayed) Labs Reviewed  CBC WITH  DIFFERENTIAL/PLATELET - Abnormal; Notable for the following components:      Result Value   WBC 13.6 (*)    RBC 5.27 (*)    Hemoglobin 15.7 (*)    Neutro Abs 9.3 (*)    Abs Immature Granulocytes 0.09 (*)    All other components within normal limits  COMPREHENSIVE METABOLIC PANEL - Abnormal; Notable for the following components:   Potassium 2.8 (*)    CO2 21 (*)    Glucose, Bld 151 (*)    Creatinine, Ser 1.03 (*)    Calcium 8.8 (*)    All other components within normal limits  URINALYSIS, ROUTINE W REFLEX MICROSCOPIC - Abnormal; Notable for the following components:   APPearance CLOUDY (*)    Specific Gravity, Urine >1.030 (*)    Hgb urine dipstick SMALL (*)    Ketones, ur 15 (*)    All other components within normal  limits  URINALYSIS, MICROSCOPIC (REFLEX) - Abnormal; Notable for the following components:   Bacteria, UA RARE (*)    All other components within normal limits  I-STAT BETA HCG BLOOD, ED ([facility], [facility], [facility] ONLY) - Abnormal; Notable for the following components:   I-stat hCG, quantitative 10.5 (*)    All other components within normal limits  RESP PANEL BY RT-PCR (FLU A&B, COVID) ARPGX2  MAGNESIUM  CBG MONITORING, ED    EKG None  Radiology No results found.  Procedures Procedures    Medications Ordered in ED Medications  lactated ringers bolus 1,000 mL (1,000 mLs Intravenous Not Given 05/28/21 0624)  ondansetron (ZOFRAN-ODT) disintegrating tablet 8 mg (8 mg Oral Given 05/27/21 2236)  lactated ringers bolus 1,000 mL (0 mLs Intravenous Stopped 05/28/21 0324)  potassium chloride 10 mEq in 100 mL IVPB (0 mEq Intravenous Stopped 05/28/21 0235)  magnesium sulfate IVPB 2 g 50 mL (0 g Intravenous Stopped 05/28/21 0235)  prochlorperazine (COMPAZINE) injection 10 mg (10 mg Intravenous Given 05/28/21 0131)  potassium chloride 10 mEq in 100 mL IVPB (0 mEq Intravenous Stopped 05/28/21 0622)  potassium chloride SA (KLOR-CON M) CR tablet 80 mEq (80 mEq Oral Given 05/28/21 0523)  promethazine (PHENERGAN) tablet 25 mg (25 mg Oral Given 05/28/21 0554)    ED Course/ Medical Decision Making/ A&P                           Medical Decision Making Amount and/or Complexity of Data Reviewed Labs: ordered.  Risk OTC drugs. Prescription drug management.   Patient hypokalemic.  Likely gastritis.  Overall patient had significant improvement in her symptoms.  She was able to tolerate p.o. for multiple hours.  Shared decision making with the patient and her husband and decided to discharge at this time as she was feeling quite a bit better.  She already has antiemetics at home so no prescription for those were written.  I encouraged Maalox and Protonix for the time being.  PCP follow-up to recheck potassium in a  week.  Return here if things are worsening.   Final Clinical Impression(s) / ED Diagnoses Final diagnoses:  Abdominal pain, unspecified abdominal location  Nausea and vomiting, unspecified vomiting type  Hypokalemia    Rx / DC Orders ED Discharge Orders          Ordered    pantoprazole (PROTONIX) 40 MG tablet  Daily at bedtime        05/28/21 0626    alum & mag hydroxide-simeth (MAALOX PLUS) 400-400-40 MG/5ML suspension  Every  6 hours PRN        05/28/21 0626    potassium chloride SA (KLOR-CON M) 20 MEQ tablet  2 times daily        05/28/21 0626              Dannika Hilgeman, Corene Cornea, MD 05/28/21 [phone]

## 2021-05-28 NOTE — ED Notes (Signed)
Pt tolerating ice water and crackers. Pt states the carbonation in the ginger ale was too much so she requested the water and is doing well with that. No acute changes noted. Will continue to monitor. Family at bedside.

## 2021-06-09 ENCOUNTER — Other Ambulatory Visit: Payer: Self-pay

## 2021-06-09 ENCOUNTER — Ambulatory Visit (INDEPENDENT_AMBULATORY_CARE_PROVIDER_SITE_OTHER): Payer: PRIVATE HEALTH INSURANCE | Admitting: Podiatry

## 2021-06-09 DIAGNOSIS — M7730 Calcaneal spur, unspecified foot: Secondary | ICD-10-CM | POA: Diagnosis not present

## 2021-06-09 DIAGNOSIS — M7661 Achilles tendinitis, right leg: Secondary | ICD-10-CM | POA: Diagnosis not present

## 2021-06-09 MED ORDER — DICLOFENAC SODIUM 1 % EX GEL: 2.0000 g | Freq: Four times a day (QID) | CUTANEOUS | 2 refills | Status: DC

## 2021-06-14 NOTE — Progress Notes (Signed)
Subjective: 51 year old female presents the office today for follow-up evaluation of pain to feet.  She feels that the heel pain as well the pain is particularly worse she is unable to wear shoe that puts any pressure on the back of her heel.  She states that walking aggravates her symptoms as well.  She has tried medications, icing, shoe modifications without any significant improvement.    Also recently diagnosed with possible congestive heart failure.  Has upcoming testing scheduled.  Per Care Everywhere: XR Lumbar spine (11/21/2020) FINDINGS:  Normal lumbar alignment. Negative for fracture, mass, or pars  defect.   Moderate disc degeneration L3-4 with disc space narrowing and  spurring. Advanced disc degeneration L4-5 and L5-S1 with disc space  narrowing and posterior osteophyte formation. Degenerative changes  have progressed since 2005.   IMPRESSION:  Progressive disc degeneration at lower 3 levels. No acute  abnormality.   Objective: AAO x3, NAD DP/PT pulses palpable bilaterally, CRT less than 3 seconds Majority tenderness is localized to the posterior aspect of the right heel only of a bone spur.  There is mild erythema and trace edema but there is no increase in warmth or any skin breakdown.  Equinus is present there is tenderness palpation of the distal portion Achilles tendon as well.  Thompson test is negative and the Achilles tendon appears to be intact.  No edema to the foot there is no other specific area of pinpoint tenderness noted today. No pain with calf compression, swelling, warmth, erythema  Assessment: Capsulitis left foot; right heel pain/Achilles tendinitis  Plan: -All treatment options discussed with the patient including all alternatives, risks, complications.  -Again reviewed the x-rays with her.  Testing of the bone spurs causing discomfort.  We discussed shoe modifications and I dispensed a heel lift.  Also the Achilles gel sleeve was dispensed to help  decrease pressure.  Referral to physical therapy.  Voltaren gel. -We did briefly discuss surgery.  She needs to be cleared and worked up for other medical conditions at this time.  Trula Slade DPM

## 2021-06-25 ENCOUNTER — Ambulatory Visit: Payer: Self-pay

## 2021-07-21 ENCOUNTER — Ambulatory Visit: Payer: Self-pay | Admitting: Podiatry

## 2021-11-19 ENCOUNTER — Emergency Department (HOSPITAL_COMMUNITY): Payer: PRIVATE HEALTH INSURANCE

## 2021-11-19 ENCOUNTER — Other Ambulatory Visit: Payer: Self-pay

## 2021-11-19 ENCOUNTER — Inpatient Hospital Stay (HOSPITAL_COMMUNITY)
Admission: EM | Admit: 2021-11-19 | Discharge: 2021-11-21 | DRG: 683 | Disposition: A | Discharge status: Home / Self Care | Payer: PRIVATE HEALTH INSURANCE | Attending: Internal Medicine | Admitting: Internal Medicine

## 2021-11-19 ENCOUNTER — Encounter (HOSPITAL_COMMUNITY): Payer: Self-pay | Admitting: *Deleted

## 2021-11-19 DIAGNOSIS — R112 Nausea with vomiting, unspecified: Secondary | ICD-10-CM | POA: Diagnosis present

## 2021-11-19 DIAGNOSIS — R1110 Vomiting, unspecified: Secondary | ICD-10-CM | POA: Diagnosis not present

## 2021-11-19 DIAGNOSIS — I10 Essential (primary) hypertension: Secondary | ICD-10-CM | POA: Diagnosis present

## 2021-11-19 DIAGNOSIS — K219 Gastro-esophageal reflux disease without esophagitis: Secondary | ICD-10-CM | POA: Diagnosis present

## 2021-11-19 DIAGNOSIS — E876 Hypokalemia: Secondary | ICD-10-CM | POA: Diagnosis present

## 2021-11-19 DIAGNOSIS — R000 Tachycardia, unspecified: Secondary | ICD-10-CM | POA: Diagnosis present

## 2021-11-19 DIAGNOSIS — Z8249 Family history of ischemic heart disease and other diseases of the circulatory system: Secondary | ICD-10-CM

## 2021-11-19 DIAGNOSIS — Z833 Family history of diabetes mellitus: Secondary | ICD-10-CM

## 2021-11-19 DIAGNOSIS — Z91040 Latex allergy status: Secondary | ICD-10-CM

## 2021-11-19 DIAGNOSIS — Z90710 Acquired absence of both cervix and uterus: Secondary | ICD-10-CM

## 2021-11-19 DIAGNOSIS — R6510 Systemic inflammatory response syndrome (SIRS) of non-infectious origin without acute organ dysfunction: Secondary | ICD-10-CM | POA: Diagnosis present

## 2021-11-19 DIAGNOSIS — Z888 Allergy status to other drugs, medicaments and biological substances status: Secondary | ICD-10-CM

## 2021-11-19 DIAGNOSIS — D72829 Elevated white blood cell count, unspecified: Secondary | ICD-10-CM | POA: Diagnosis present

## 2021-11-19 DIAGNOSIS — E860 Dehydration: Secondary | ICD-10-CM | POA: Diagnosis present

## 2021-11-19 DIAGNOSIS — F1290 Cannabis use, unspecified, uncomplicated: Secondary | ICD-10-CM | POA: Diagnosis present

## 2021-11-19 DIAGNOSIS — F32A Depression, unspecified: Secondary | ICD-10-CM | POA: Diagnosis present

## 2021-11-19 DIAGNOSIS — R9431 Abnormal electrocardiogram [ECG] [EKG]: Secondary | ICD-10-CM | POA: Diagnosis present

## 2021-11-19 DIAGNOSIS — N179 Acute kidney failure, unspecified: Principal | ICD-10-CM | POA: Diagnosis present

## 2021-11-19 DIAGNOSIS — Z87891 Personal history of nicotine dependence: Secondary | ICD-10-CM

## 2021-11-19 DIAGNOSIS — Z79899 Other long term (current) drug therapy: Secondary | ICD-10-CM

## 2021-11-19 DIAGNOSIS — Z7982 Long term (current) use of aspirin: Secondary | ICD-10-CM

## 2021-11-19 DIAGNOSIS — G4733 Obstructive sleep apnea (adult) (pediatric): Secondary | ICD-10-CM | POA: Diagnosis present

## 2021-11-19 DIAGNOSIS — F419 Anxiety disorder, unspecified: Secondary | ICD-10-CM | POA: Diagnosis present

## 2021-11-19 DIAGNOSIS — Z87442 Personal history of urinary calculi: Secondary | ICD-10-CM

## 2021-11-19 DIAGNOSIS — Z7984 Long term (current) use of oral hypoglycemic drugs: Secondary | ICD-10-CM

## 2021-11-19 LAB — RAPID URINE DRUG SCREEN, HOSP PERFORMED
Amphetamines: NOT DETECTED
Barbiturates: NOT DETECTED
Benzodiazepines: NOT DETECTED
Cocaine: NOT DETECTED
Opiates: NOT DETECTED
Tetrahydrocannabinol: POSITIVE — AB

## 2021-11-19 LAB — CBC WITH DIFFERENTIAL/PLATELET
Abs Immature Granulocytes: 0.13 10*3/uL — ABNORMAL HIGH (ref 0.00–0.07)
Basophils Absolute: 0 10*3/uL (ref 0.0–0.1)
Basophils Relative: 0 %
Eosinophils Absolute: 0.1 10*3/uL (ref 0.0–0.5)
Eosinophils Relative: 0 %
HCT: 50.2 % — ABNORMAL HIGH (ref 36.0–46.0)
Hemoglobin: 17.9 g/dL — ABNORMAL HIGH (ref 12.0–15.0)
Immature Granulocytes: 1 %
Lymphocytes Relative: 15 %
Lymphs Abs: 2.6 10*3/uL (ref 0.7–4.0)
MCH: 30.4 pg (ref 26.0–34.0)
MCHC: 35.7 g/dL (ref 30.0–36.0)
MCV: 85.2 fL (ref 80.0–100.0)
Monocytes Absolute: 1 10*3/uL (ref 0.1–1.0)
Monocytes Relative: 5 %
Neutro Abs: 14 10*3/uL — ABNORMAL HIGH (ref 1.7–7.7)
Neutrophils Relative %: 79 %
Platelets: 402 10*3/uL — ABNORMAL HIGH (ref 150–400)
RBC: 5.89 MIL/uL — ABNORMAL HIGH (ref 3.87–5.11)
RDW: 14 % (ref 11.5–15.5)
WBC: 17.8 10*3/uL — ABNORMAL HIGH (ref 4.0–10.5)
nRBC: 0 % (ref 0.0–0.2)

## 2021-11-19 LAB — COMPREHENSIVE METABOLIC PANEL
ALT: 33 U/L (ref 0–44)
AST: 35 U/L (ref 15–41)
Albumin: 5.2 g/dL — ABNORMAL HIGH (ref 3.5–5.0)
Alkaline Phosphatase: 85 U/L (ref 38–126)
Anion gap: 26 — ABNORMAL HIGH (ref 5–15)
BUN: 56 mg/dL — ABNORMAL HIGH (ref 6–20)
CO2: 22 mmol/L (ref 22–32)
Calcium: 9.9 mg/dL (ref 8.9–10.3)
Chloride: 87 mmol/L — ABNORMAL LOW (ref 98–111)
Creatinine, Ser: 4.38 mg/dL — ABNORMAL HIGH (ref 0.44–1.00)
GFR, Estimated: 12 mL/min — ABNORMAL LOW (ref >60)
Glucose, Bld: 221 mg/dL — ABNORMAL HIGH (ref 70–99)
Potassium: 2.2 mmol/L — CL (ref 3.5–5.1)
Sodium: 135 mmol/L (ref 135–145)
Total Bilirubin: 1.1 mg/dL (ref 0.3–1.2)
Total Protein: 9.3 g/dL — ABNORMAL HIGH (ref 6.5–8.1)

## 2021-11-19 LAB — MAGNESIUM: Magnesium: 2.2 mg/dL (ref 1.7–2.4)

## 2021-11-19 LAB — LIPASE, BLOOD: Lipase: 32 U/L (ref 11–51)

## 2021-11-19 MED ORDER — PANTOPRAZOLE SODIUM 40 MG IV SOLR
40.0000 mg | INTRAVENOUS | Status: DC
  Administered 2021-11-19 – 2021-11-20 (×2): 40 mg via INTRAVENOUS
  Filled 2021-11-19 (×2): qty 10

## 2021-11-19 MED ORDER — LORAZEPAM 2 MG/ML IJ SOLN
0.5000 mg | Freq: Once | INTRAMUSCULAR | Status: AC
  Administered 2021-11-19: 0.5 mg via INTRAVENOUS
  Filled 2021-11-19: qty 1

## 2021-11-19 MED ORDER — POTASSIUM CHLORIDE IN NACL 40-0.9 MEQ/L-% IV SOLN: INTRAVENOUS | Status: DC

## 2021-11-19 MED ORDER — SODIUM CHLORIDE 0.9 % IV SOLN
12.5000 mg | Freq: Once | INTRAVENOUS | Status: AC
  Administered 2021-11-19: 12.5 mg via INTRAVENOUS
  Filled 2021-11-19: qty 0.5

## 2021-11-19 MED ORDER — POTASSIUM CHLORIDE 10 MEQ/100ML IV SOLN
10.0000 meq | INTRAVENOUS | Status: AC
  Administered 2021-11-19 (×2): 10 meq via INTRAVENOUS
  Filled 2021-11-19 (×2): qty 100

## 2021-11-19 MED ORDER — POTASSIUM CHLORIDE 10 MEQ/100ML IV SOLN
10.0000 meq | INTRAVENOUS | Status: AC
  Administered 2021-11-19 (×4): 10 meq via INTRAVENOUS
  Filled 2021-11-19 (×4): qty 100

## 2021-11-19 MED ORDER — ALUM & MAG HYDROXIDE-SIMETH 200-200-20 MG/5ML PO SUSP
30.0000 mL | ORAL | Status: DC | PRN
Start: 2021-11-19 — End: 2021-11-21
  Administered 2021-11-19: 30 mL via ORAL
  Filled 2021-11-19: qty 30

## 2021-11-19 MED ORDER — PROCHLORPERAZINE EDISYLATE 10 MG/2ML IJ SOLN
10.0000 mg | Freq: Once | INTRAMUSCULAR | Status: AC
  Administered 2021-11-19: 10 mg via INTRAVENOUS
  Filled 2021-11-19: qty 2

## 2021-11-19 MED ORDER — SODIUM CHLORIDE 0.9 % IV BOLUS
1000.0000 mL | Freq: Once | INTRAVENOUS | Status: AC
  Administered 2021-11-19: 1000 mL via INTRAVENOUS

## 2021-11-19 MED ORDER — ONDANSETRON HCL 4 MG/2ML IJ SOLN
4.0000 mg | Freq: Once | INTRAMUSCULAR | Status: AC
  Administered 2021-11-19: 4 mg via INTRAVENOUS
  Filled 2021-11-19: qty 2

## 2021-11-19 MED ORDER — ACETAMINOPHEN 650 MG RE SUPP: 650.0000 mg | Freq: Four times a day (QID) | RECTAL | Status: DC | PRN

## 2021-11-19 MED ORDER — PROMETHAZINE HCL 25 MG RE SUPP
25.0000 mg | Freq: Once | RECTAL | Status: AC
  Administered 2021-11-19: 25 mg via RECTAL
  Filled 2021-11-19: qty 1

## 2021-11-19 MED ORDER — ACETAMINOPHEN 325 MG PO TABS: 650.0000 mg | ORAL_TABLET | Freq: Four times a day (QID) | ORAL | Status: DC | PRN

## 2021-11-19 MED ORDER — HEPARIN SODIUM (PORCINE) 5000 UNIT/ML IJ SOLN
5000.0000 [IU] | Freq: Three times a day (TID) | INTRAMUSCULAR | Status: DC
  Administered 2021-11-20 (×2): 5000 [IU] via SUBCUTANEOUS
  Filled 2021-11-19 (×3): qty 1

## 2021-11-19 MED ORDER — PROMETHAZINE HCL 25 MG/ML IJ SOLN
INTRAMUSCULAR | Status: AC
  Filled 2021-11-19: qty 1

## 2021-11-19 MED ORDER — POLYETHYLENE GLYCOL 3350 17 G PO PACK: 17.0000 g | PACK | Freq: Every day | ORAL | Status: DC | PRN

## 2021-11-19 MED ORDER — LACTATED RINGERS IV BOLUS
1000.0000 mL | Freq: Once | INTRAVENOUS | Status: AC
  Administered 2021-11-19: 1000 mL via INTRAVENOUS

## 2021-11-19 NOTE — Assessment & Plan Note (Signed)
Stable. - Resume Norvasc  - hold HCTZ for now with AKI and dehydration

## 2021-11-19 NOTE — Assessment & Plan Note (Addendum)
Hold bupropion, Lexapro with prolonged QT interval

## 2021-11-19 NOTE — ED Provider Notes (Signed)
[facility] EMERGENCY DEPARTMENT Provider Note   CSN: [id number] Arrival date & time: 11/19/21  1421     History  Chief Complaint  Patient presents with   Emesis    Beverly Elliott is a 51 y.o. female.  HPI 51 year old female presents with intractable vomiting.  This started on 7/10.  Unable to keep anything down.  Numerous episodes of emesis a day.  Her doctor called her and a suppository but she has been unable to get it from the pharmacy as they have not had it.  She think she had fever the first couple days but none since.  She feels like she is breathing a little faster but denies chest pain.  No diarrhea, urinary symptoms or abdominal pain besides some burning in her upper abdomen.  She states that whenever she gets sick like this, which she attributes to a virus, her potassium tends to go low.  Today when she was walking she felt weak and went down to the ground.  Home Medications Prior to Admission medications   Medication Sig Start Date End Date Taking? Authorizing Provider  alum & mag hydroxide-simeth (MAALOX PLUS) 400-400-40 MG/5ML suspension Take 15 mLs by mouth every 6 (six) hours as needed for indigestion. 05/28/21   Mesner, Corene Cornea, MD  amLODipine (NORVASC) 10 MG tablet Take 10 mg by mouth at bedtime. 12/29/20   [provider]  diclofenac Sodium (VOLTAREN) 1 % GEL Apply 2 g topically 4 (four) times daily. Rub into affected area of foot 2 to 4 times daily 06/09/21   Trula Slade, DPM  dicyclomine (BENTYL) 20 MG tablet Take 20 mg by mouth daily as needed for spasms. 03/14/17   [provider]  EQ ASPIRIN ADULT LOW DOSE 81 MG EC tablet Take 81 mg by mouth daily. 04/15/21   [provider]  escitalopram (LEXAPRO) 20 MG tablet Take 20 mg by mouth at bedtime. 05/08/21   [provider]  gabapentin (NEURONTIN) 300 MG capsule Take 300 mg by mouth 3 (three) times daily as needed (pain). 11/24/20   [provider]  magnesium oxide  (MAG-OX) 400 (240 Mg) MG tablet Take 400 mg by mouth at bedtime. 04/15/21   [provider]  nebivolol (BYSTOLIC) 5 MG tablet Take 2.5 mg by mouth daily. 05/25/21   [provider]  pantoprazole (PROTONIX) 40 MG tablet Take 1 tablet (40 mg total) by mouth at bedtime. 05/28/21   Mesner, Corene Cornea, MD  potassium chloride SA (KLOR-CON M) 20 MEQ tablet Take 2 tablets (40 mEq total) by mouth 2 (two) times daily for 7 days. 05/28/21 06/04/21  Mesner, Corene Cornea, MD  pravastatin (PRAVACHOL) 40 MG tablet Take 40 mg by mouth at bedtime. 05/10/21   [provider]  promethazine (PHENERGAN) 25 MG tablet Take 1 tablet (25 mg total) by mouth every 6 (six) hours as needed for nausea or vomiting. 6/96/29   Campbell Stall P, DO  tiZANidine (ZANAFLEX) 4 MG tablet Take 4 mg by mouth every 8 (eight) hours as needed for muscle spasms. 11/24/20   [provider]      Allergies    Latex, Metoclopramide, Metoclopramide hcl, and Tape    Review of Systems   Review of Systems  Constitutional:  Positive for fever.  Respiratory:  Negative for shortness of breath.   Cardiovascular:  Negative for chest pain.  Gastrointestinal:  Positive for abdominal pain, nausea and vomiting. Negative for diarrhea.  Genitourinary:  Negative for dysuria.  Musculoskeletal:  Negative for back  pain.  Neurological:  Positive for weakness.    Physical Exam Updated Vital Signs BP (!) 137/91   Pulse (!) 117   Temp 98.1 F (36.7 C) (Oral)   Resp (!) 25   Ht '5\' 2"'$  (1.575 m)   Wt 97.5 kg   SpO2 100%   BMI 39.32 kg/m  Physical Exam Vitals and nursing note reviewed.  Constitutional:      Appearance: She is well-developed. She is not diaphoretic.  HENT:     Head: Normocephalic and atraumatic.  Cardiovascular:     Rate and Rhythm: Regular rhythm. Tachycardia present.     Heart sounds: Normal heart sounds.  Pulmonary:     Effort: Pulmonary effort is normal.     Breath sounds: Normal breath sounds.  Abdominal:      General: There is no distension.     Palpations: Abdomen is soft.     Tenderness: There is no abdominal tenderness.  Skin:    General: Skin is warm and dry.  Neurological:     Mental Status: She is alert.     ED Results / Procedures / Treatments   Labs (all labs ordered are listed, but only abnormal results are displayed) Labs Reviewed  COMPREHENSIVE METABOLIC PANEL - Abnormal; Notable for the following components:      Result Value   Potassium 2.2 (*)    Chloride 87 (*)    Glucose, Bld 221 (*)    BUN 56 (*)    Creatinine, Ser 4.38 (*)    Total Protein 9.3 (*)    Albumin 5.2 (*)    GFR, Estimated 12 (*)    Anion gap 26 (*)    All other components within normal limits  CBC WITH DIFFERENTIAL/PLATELET - Abnormal; Notable for the following components:   WBC 17.8 (*)    RBC 5.89 (*)    Hemoglobin 17.9 (*)    HCT 50.2 (*)    Platelets 402 (*)    Neutro Abs 14.0 (*)    Abs Immature Granulocytes 0.13 (*)    All other components within normal limits  LIPASE, BLOOD  MAGNESIUM  URINALYSIS, ROUTINE W REFLEX MICROSCOPIC    EKG EKG Interpretation  Date/Time:  Thursday November 19 2021 14:31:37 EDT Ventricular Rate:  129 PR Interval:  70 QRS Duration: 92 QT Interval:  354 QTC Calculation: 519 R Axis:   85 Text Interpretation: Sinus tachycardia Probable LVH with secondary repol abnrm ST depression, consider ischemia, diffuse lds Prolonged QT interval Baseline wander in lead(s) V2 ST changes in similar distribution to Jan 2023 Confirmed by Sherwood Gambler [phone]) on 11/19/2021 3:03:04 PM  Radiology DG Chest Portable 1 View  Result Date: 11/19/2021 CLINICAL DATA:  Nausea and vomiting for 4 days. EXAM: PORTABLE CHEST 1 VIEW COMPARISON:  Two-view chest x-ray 01/24/2013 FINDINGS: Heart size is normal. Lungs are clear. No nodule or mass lesion is present. No focal airspace disease present. No edema or effusion. IMPRESSION: Negative one-view chest x-ray. Electronically Signed   By:  San Morelle M.D.   On: 11/19/2021 15:28    Procedures .Critical Care  Performed by: Sherwood Gambler, MD Authorized by: Sherwood Gambler, MD   Critical care provider statement:    Critical care time (minutes):  35   Critical care time was exclusive of:  Separately billable procedures and treating other patients   Critical care was necessary to treat or prevent imminent or life-threatening deterioration of the following conditions:  Metabolic crisis and renal failure   Critical care  was time spent personally by me on the following activities:  Development of treatment plan with patient or surrogate, discussions with consultants, evaluation of patient's response to treatment, examination of patient, ordering and review of laboratory studies, ordering and review of radiographic studies, ordering and performing treatments and interventions, pulse oximetry, re-evaluation of patient's condition and review of old charts     Medications Ordered in ED Medications  potassium chloride 10 mEq in 100 mL IVPB (10 mEq Intravenous New Bag/Given 11/19/21 1546)  promethazine (PHENERGAN) suppository 25 mg (has no administration in time range)  sodium chloride 0.9 % bolus 1,000 mL (1,000 mLs Intravenous New Bag/Given 11/19/21 1444)  lactated ringers bolus 1,000 mL (1,000 mLs Intravenous New Bag/Given 11/19/21 1528)  ondansetron (ZOFRAN) injection 4 mg (4 mg Intravenous Given 11/19/21 1528)    ED Course/ Medical Decision Making/ A&P                           Medical Decision Making Amount and/or Complexity of Data Reviewed External Data Reviewed: radiology and notes.    Details: Seen at outside hospital March 2023 with similar symptoms and a negative CT scan. Labs: ordered.    Details: Significant hypokalemia to 2.2 and new acute kidney injury with creatinine of 4.3.  Nonspecific leukocytosis and hemoconcentration, probably from dehydration Radiology: ordered and independent interpretation  performed.    Details: Chest x-ray without free air or pneumonia ECG/medicine tests: ordered and independent interpretation performed.    Details: Chronic nonspecific ST changes with increased heart rate  Risk Prescription drug management.   Patient presents with significant dehydration from recurrent vomiting.  Talking more to her and husband, she has been having vomiting episodes like this once a year for quite some time but over the past 7 months has been occurring at least once a month.  Thus, with no abdominal pain or tenderness or distention, I do not think a repeat CT is needed.  Had a negative CT with similar symptoms though not as severe back in March.  At this point I think she will need aggressive fluids, potassium replacement, and admission.  Doubt acute infection. I discussed case with Dr. Denton Brick, who will admit.         Final Clinical Impression(s) / ED Diagnoses Final diagnoses:  Intractable vomiting  Hypokalemia  Acute kidney injury [facility])    Rx / DC Orders ED Discharge Orders     None         Sherwood Gambler, MD 11/19/21 1607

## 2021-11-19 NOTE — Progress Notes (Signed)
Patient urine walked to lab. Verified through [place] ok to run with fluids as ordered, ns with 40 meq K. Patient resting with eyes closed, gentle snoring.  Ice chips provided. Patient has three more runs of KCL peripheral by order, but having to run slow due to tolerance, provider aware.

## 2021-11-19 NOTE — Assessment & Plan Note (Addendum)
Recurrent episodes of vomiting.  Leukocytosis today of 17.8.  Afebrile.  Meeting SIRS criteria with tachycardia and leukocytosis likely due to stress reaction and dehydration.  Abdominal exam is benign.  CT 07/2021 for similar symptoms per Care Everywhere without acute abnormality also.  Reports GI work-up including EGD and colonoscopy for same symptoms without allergy to explain symptoms. -Deferred CT Abd for now, low threshold to obtain imaging -QT prolonged at 519 likely from hypokalemia, IV Phenergan sparingly -  Hydrate - UDS -IV Protonix 40 daily -  bowel rest-n.p.o. except for sips -Blood glucose of 221, recent A1c 08/2021 was 5.9.  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

## 2021-11-19 NOTE — ED Triage Notes (Signed)
Pt c/o n/v x 4 days; pt has increased heartrate in 140's per ems

## 2021-11-19 NOTE — Assessment & Plan Note (Signed)
Creatinine elevated at 4.38, increased baseline of  ~ 1.  Likely prerenal from GI losses, HCTZ. - 2 L bolus given, continue N/s + 40 KCL 100cc/hr x 1 day -Hold HCTZ -IV Phenergan

## 2021-11-19 NOTE — H&P (Addendum)
History and Physical    Beverly Elliott ZDG:[id number] DOB: 03-18-1971 DOA: 11/19/2021  PCP: [facility]   Patient coming from: Home  I have personally briefly reviewed patient's old medical records in [place]  Chief Complaint: Vomiting  HPI: Beverly Elliott is a 51 y.o. female with medical history significant for depression, OSA, anxiety. Patient presented to the ED with complaints of nausea and vomiting of 4 days duration.  She reports multiple episodes of vomiting.  She reports weakness and feeling like she was going to pass out.  Last bowel movement was 3 days ago on Monday, it was normal.  She has not been able to eat or keep anything down for the past 4 days, and has been drinking just water and Sprite.  She reports some burning feeling in the upper abdomen but this is usual with these episodes, otherwise without abdominal pain.  No fevers no chills.  She also usually has hypokalemia with these.  Symptoms usually last 1 to 2 days and resolved. Patient has had several episodes of this, previously it seems to be once a year, but recently its been almost every month the past year.  He tells me he has had extensive GI evaluation, including EGD and colonoscopy without findings that would explain her symptoms.  Per Care Everywhere, she was in the ED 07/[facility] with similar symptoms of vomiting, AKI, she had CT abdomen and pelvis with contrast done that was negative for acute findings.  ED Course: Tmax 98.8.  Heart rate 96-128.  Respiratory 20-25.  Blood pressure systolic 951-884.   Potassium 2.2 Creatinine 4.28 WBC 17.8.   Lipase normal 32.   Magnesium normal 2.2. Chest x-ray without acute abnormality. Hospitalist to admit for hypokalemia, AKI, and persistent intractable nausea and vomiting   Review of Systems: As per HPI all other systems reviewed and negative.  Past Medical History:  Diagnosis Date   Anemia    Asthma     Depression    GERD (gastroesophageal reflux disease)    History of chicken pox    IBS (irritable bowel syndrome)    Kidney stones    Migraine    UTI (lower urinary tract infection)     Past Surgical History:  Procedure Laterality Date   ABDOMINAL HYSTERECTOMY     CESAREAN SECTION     TONSILLECTOMY       reports that she has quit smoking. Her smoking use included cigarettes. She smoked an average of .8 packs per day. She has never used smokeless tobacco. She reports current alcohol use. She reports current drug use. Drug: Marijuana.  Allergies  Allergen Reactions   Latex Swelling   Metoclopramide Nausea And Vomiting    Vomited for 8 hours    Metoclopramide Hcl Nausea And Vomiting    Vomited for 8 hours   Tape Rash    Adhesive tape    Family History  Problem Relation Age of Onset   Coronary artery disease Mother    Coronary artery disease Father    Heart attack Father    Hypertension Mother    Hypertension Paternal Grandmother    Diabetes Maternal Grandfather    Prior to Admission medications   Medication Sig Start Date End Date Taking? Authorizing Provider  amLODipine (NORVASC) 10 MG tablet Take 10 mg by mouth at bedtime. 12/29/20  Yes [provider]  buPROPion (WELLBUTRIN XL) 300 MG 24 hr tablet Take 300 mg by mouth daily. 09/07/21  Yes [provider]  dicyclomine (BENTYL) 20 MG tablet Take 20 mg by mouth daily as needed for spasms. 03/14/17  Yes [provider]  dimenhyDRINATE (DRAMAMINE) 50 MG tablet Take 50 mg by mouth every 8 (eight) hours as needed.   Yes [provider]  EQ ASPIRIN ADULT LOW DOSE 81 MG EC tablet Take 81 mg by mouth daily. 04/15/21  Yes [provider]  escitalopram (LEXAPRO) 20 MG tablet Take 20 mg by mouth at bedtime. 05/08/21  Yes [provider]  hydrochlorothiazide (HYDRODIURIL) 25 MG tablet Take 25 mg by mouth daily. 06/10/21  Yes [provider]  Magnesium Oxide, Antacid, 400 MG  TABS Take 0.5 tablets by mouth daily. 08/08/21  Yes [provider]  metFORMIN (GLUCOPHAGE-XR) 500 MG 24 hr tablet Take 500 mg by mouth every morning. 11/13/21  Yes [provider]  pantoprazole (PROTONIX) 40 MG tablet Take 1 tablet (40 mg total) by mouth at bedtime. 05/28/21  Yes Mesner, Corene Cornea, MD  pravastatin (PRAVACHOL) 40 MG tablet Take 40 mg by mouth at bedtime. 05/10/21  Yes [provider]  promethazine (PHENERGAN) 25 MG suppository Place 25 mg rectally every 6 (six) hours as needed for nausea or vomiting.   Yes [provider]    Physical Exam: Vitals:   11/19/21 1428 11/19/21 1530 11/19/21 1600 11/19/21 1704  BP: 113/82 (!) 137/91 (!) 137/96 (!) 154/109  Pulse: (!) 128 (!) 117 96 (!) 102  Resp: (!) 25 (!) '25 20 20  '$ Temp: 98.1 F (36.7 C)   98.8 F (37.1 C)  TempSrc: Oral   Oral  SpO2: 100% 100% 98% 100%  Weight:      Height:        Constitutional: NAD, calm, comfortable Vitals:   11/19/21 1428 11/19/21 1530 11/19/21 1600 11/19/21 1704  BP: 113/82 (!) 137/91 (!) 137/96 (!) 154/109  Pulse: (!) 128 (!) 117 96 (!) 102  Resp: (!) 25 (!) '25 20 20  '$ Temp: 98.1 F (36.7 C)   98.8 F (37.1 C)  TempSrc: Oral   Oral  SpO2: 100% 100% 98% 100%  Weight:      Height:       Eyes: PERRL, lids and conjunctivae normal ENMT: Mucous membranes are moist.  Neck: normal, supple, no masses, no thyromegaly Respiratory: clear to auscultation bilaterally, no wheezing, no crackles. Normal respiratory effort. No accessory muscle use.  Cardiovascular: Regular rate and rhythm, no murmurs / rubs / gallops. No extremity edema.  Lower extremities warm. Abdomen: no tenderness, soft, no masses palpated. No hepatosplenomegaly.  Musculoskeletal: no clubbing / cyanosis. No joint deformity upper and lower extremities.  Skin: no rashes, lesions, ulcers. No induration Neurologic: No apparent cranial nerve abnormality, moving extremities spontaneously Psychiatric: Normal  judgment and insight. Alert and oriented x 3. Normal mood.   Labs on Admission: I have personally reviewed following labs and imaging studies  CBC: Recent Labs  Lab 11/19/21 1435  WBC 17.8*  NEUTROABS 14.0*  HGB 17.9*  HCT 50.2*  MCV 85.2  PLT 403*   Basic Metabolic Panel: Recent Labs  Lab 11/19/21 1435  NA 135  K 2.2*  CL 87*  CO2 22  GLUCOSE 221*  BUN 56*  CREATININE 4.38*  CALCIUM 9.9  MG 2.2   GFR: Estimated Creatinine Clearance: 16.9 mL/min (A) (by C-G formula based on SCr of 4.38 mg/dL (H)). Liver Function Tests: Recent Labs  Lab 11/19/21 1435  AST 35  ALT 33  ALKPHOS 85  BILITOT 1.1  PROT  9.3*  ALBUMIN 5.2*   Recent Labs  Lab 11/19/21 1435  LIPASE 32   Radiological Exams on Admission: DG Chest Portable 1 View  Result Date: 11/19/2021 CLINICAL DATA:  Nausea and vomiting for 4 days. EXAM: PORTABLE CHEST 1 VIEW COMPARISON:  Two-view chest x-ray 01/24/2013 FINDINGS: Heart size is normal. Lungs are clear. No nodule or mass lesion is present. No focal airspace disease present. No edema or effusion. IMPRESSION: Negative one-view chest x-ray. Electronically Signed   By: San Morelle M.D.   On: 11/19/2021 15:28    EKG: Independently reviewed.  Sinus tachycardia rate 129, QTc prolonged 519.  Assessment/Plan Principal Problem:   AKI (acute kidney injury) ([place]) Active Problems:   Anxiety and depression   Essential hypertension   Hypokalemia   Intractable vomiting   Prolonged QT interval  Assessment and Plan: * AKI (acute kidney injury) ([place]) Creatinine elevated at 4.38, increased baseline of  ~ 1.  Likely prerenal from GI losses, HCTZ. - 2 L bolus given, continue N/s + 40 KCL 100cc/hr x 1 day -Hold HCTZ -IV Phenergan  Intractable vomiting Recurrent episodes of vomiting.  Leukocytosis today of 17.8.  Afebrile.  Meeting SIRS criteria with tachycardia and leukocytosis likely due to stress reaction and dehydration.  Abdominal exam is benign.  CT  07/2021 for similar symptoms per Care Everywhere without acute abnormality also.  Reports GI work-up including EGD and colonoscopy for same symptoms without allergy to explain symptoms. -Deferred CT Abd for now, low threshold to obtain imaging -QT prolonged at 519 likely from hypokalemia, IV Phenergan sparingly -  Hydrate - UDS -IV Protonix 40 daily -  bowel rest-n.p.o. except for sips -Blood glucose of 221, recent A1c 08/2021 was 5.9.  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  Hypokalemia Potassium 2.2.  Likely from GI losses.  Replete.  Prolonged QT interval 519.  Likely due to hypokalemia of 2.2. `Magnesium- 2.2.  She is also on Lexapro and bupropion. -Hold both for now  Essential hypertension Stable. - Resume Norvasc  - hold HCTZ for now with AKI and dehydration  Anxiety and depression Hold bupropion, Lexapro with prolonged QT interval    DVT prophylaxis: heparin Code Status: Full Family Communication: Also at bedside Disposition Plan: ~ 2 days Consults called: None  Admission status: Obs tele  Author: Bethena Roys, MD 11/19/2021 10:10 PM  For on call review [email or web address].

## 2021-11-19 NOTE — Assessment & Plan Note (Addendum)
519.  Likely due to hypokalemia of 2.2. `Magnesium- 2.2.  She is also on Lexapro and bupropion. -Hold both for now

## 2021-11-19 NOTE — Progress Notes (Signed)
Patient arrived to unit with stable vitals. Alert and oriented. Family at bedside. Limited orders in computer at this time. Oriented to room. KCL infusing per order. Patient NPO currently, provided with mouth swabs. Call bell within reach.

## 2021-11-19 NOTE — Assessment & Plan Note (Signed)
Potassium 2.2.  Likely from GI losses.  Replete.

## 2021-11-19 NOTE — Progress Notes (Signed)
Notified MD team of patient's nausea.

## 2021-11-20 DIAGNOSIS — F1290 Cannabis use, unspecified, uncomplicated: Secondary | ICD-10-CM | POA: Diagnosis present

## 2021-11-20 DIAGNOSIS — F32A Depression, unspecified: Secondary | ICD-10-CM | POA: Diagnosis present

## 2021-11-20 DIAGNOSIS — G4733 Obstructive sleep apnea (adult) (pediatric): Secondary | ICD-10-CM | POA: Diagnosis present

## 2021-11-20 DIAGNOSIS — F419 Anxiety disorder, unspecified: Secondary | ICD-10-CM | POA: Diagnosis present

## 2021-11-20 DIAGNOSIS — R112 Nausea with vomiting, unspecified: Secondary | ICD-10-CM | POA: Diagnosis present

## 2021-11-20 DIAGNOSIS — E860 Dehydration: Secondary | ICD-10-CM | POA: Diagnosis present

## 2021-11-20 DIAGNOSIS — K219 Gastro-esophageal reflux disease without esophagitis: Secondary | ICD-10-CM | POA: Diagnosis present

## 2021-11-20 DIAGNOSIS — R9431 Abnormal electrocardiogram [ECG] [EKG]: Secondary | ICD-10-CM | POA: Diagnosis present

## 2021-11-20 DIAGNOSIS — R000 Tachycardia, unspecified: Secondary | ICD-10-CM | POA: Diagnosis present

## 2021-11-20 DIAGNOSIS — Z888 Allergy status to other drugs, medicaments and biological substances status: Secondary | ICD-10-CM | POA: Diagnosis not present

## 2021-11-20 DIAGNOSIS — Z87891 Personal history of nicotine dependence: Secondary | ICD-10-CM | POA: Diagnosis not present

## 2021-11-20 DIAGNOSIS — R6510 Systemic inflammatory response syndrome (SIRS) of non-infectious origin without acute organ dysfunction: Secondary | ICD-10-CM | POA: Diagnosis present

## 2021-11-20 DIAGNOSIS — I10 Essential (primary) hypertension: Secondary | ICD-10-CM | POA: Diagnosis present

## 2021-11-20 DIAGNOSIS — N179 Acute kidney failure, unspecified: Secondary | ICD-10-CM | POA: Diagnosis present

## 2021-11-20 DIAGNOSIS — Z8249 Family history of ischemic heart disease and other diseases of the circulatory system: Secondary | ICD-10-CM | POA: Diagnosis not present

## 2021-11-20 DIAGNOSIS — D72829 Elevated white blood cell count, unspecified: Secondary | ICD-10-CM | POA: Diagnosis present

## 2021-11-20 DIAGNOSIS — Z7982 Long term (current) use of aspirin: Secondary | ICD-10-CM | POA: Diagnosis not present

## 2021-11-20 DIAGNOSIS — Z90710 Acquired absence of both cervix and uterus: Secondary | ICD-10-CM | POA: Diagnosis not present

## 2021-11-20 DIAGNOSIS — Z833 Family history of diabetes mellitus: Secondary | ICD-10-CM | POA: Diagnosis not present

## 2021-11-20 DIAGNOSIS — Z7984 Long term (current) use of oral hypoglycemic drugs: Secondary | ICD-10-CM | POA: Diagnosis not present

## 2021-11-20 DIAGNOSIS — Z79899 Other long term (current) drug therapy: Secondary | ICD-10-CM | POA: Diagnosis not present

## 2021-11-20 DIAGNOSIS — Z87442 Personal history of urinary calculi: Secondary | ICD-10-CM | POA: Diagnosis not present

## 2021-11-20 DIAGNOSIS — Z91040 Latex allergy status: Secondary | ICD-10-CM | POA: Diagnosis not present

## 2021-11-20 DIAGNOSIS — E876 Hypokalemia: Secondary | ICD-10-CM | POA: Diagnosis present

## 2021-11-20 LAB — BASIC METABOLIC PANEL
Anion gap: 14 (ref 5–15)
BUN: 54 mg/dL — ABNORMAL HIGH (ref 6–20)
CO2: 28 mmol/L (ref 22–32)
Calcium: 8.2 mg/dL — ABNORMAL LOW (ref 8.9–10.3)
Chloride: 98 mmol/L (ref 98–111)
Creatinine, Ser: 2.85 mg/dL — ABNORMAL HIGH (ref 0.44–1.00)
GFR, Estimated: 20 mL/min — ABNORMAL LOW (ref >60)
Glucose, Bld: 111 mg/dL — ABNORMAL HIGH (ref 70–99)
Potassium: 2.9 mmol/L — ABNORMAL LOW (ref 3.5–5.1)
Sodium: 140 mmol/L (ref 135–145)

## 2021-11-20 LAB — HIV ANTIBODY (ROUTINE TESTING W REFLEX): HIV Screen 4th Generation wRfx: NONREACTIVE

## 2021-11-20 MED ORDER — POTASSIUM CHLORIDE IN NACL 40-0.9 MEQ/L-% IV SOLN: INTRAVENOUS | Status: AC

## 2021-11-20 NOTE — Progress Notes (Signed)
  Transition of Care [facility]) Screening Note   Patient Details  Name: Beverly Elliott Date of Birth: 1970/07/01   Transition of Care [facility]) CM/SW Contact:    Ihor Gully, LCSW Phone Number: 11/20/2021, 10:47 AM    Transition of Care Department [facility]) has reviewed patient and no TOC needs have been identified at this time. We will continue to monitor patient advancement through interdisciplinary progression rounds. If new patient transition needs arise, please place a TOC consult.

## 2021-11-20 NOTE — Progress Notes (Signed)
PROGRESS NOTE    Christain Mcraney  VVO:[id number] DOB: 1970/11/21 DOA: 11/19/2021 PCP: [facility]   Brief Narrative:    Beverly Elliott is a 51 y.o. female with medical history significant for depression, OSA, anxiety. Patient presented to the ED with complaints of nausea and vomiting of 4 days duration.  She was admitted for AKI and severe hypokalemia in the setting of intractable nausea and vomiting likely secondary to cannabis hyperemesis.  Assessment & Plan:   Principal Problem:   AKI (acute kidney injury) ([place]) Active Problems:   Intractable vomiting   Hypokalemia   Anxiety and depression   Essential hypertension   Prolonged QT interval  Assessment and Plan:   AKI (acute kidney injury) ([facility])-improving Creatinine elevated at 4.38, increased baseline of  ~ 1.  Likely prerenal from GI losses, HCTZ. - 2 L bolus given, continue N/s + 40 KCL 100cc/hr x 1 day -Hold HCTZ -IV Phenergan -Continue IV fluid with strict I's and O's -Repeat a.m. labs   Intractable vomiting likely in the setting of cannabis hyperemesis -Multiple endoscopy studies noted to be negative -Continue IV PPI daily -Advance to clear liquid diet -Continue IV fluid hydration   Hypokalemia Potassium levels improving, continue to replete and follow   Prolonged QT interval 519.  Likely due to hypokalemia of 2.2. `Magnesium- 2.2.  She is also on Lexapro and bupropion. -Hold both for now   Essential hypertension Stable. - Resume Norvasc  - hold HCTZ for now with AKI and dehydration   Anxiety and depression Hold bupropion, Lexapro with prolonged QT interval    DVT prophylaxis: Heparin Code Status: Full Family Communication: None at bedside Disposition Plan:  Status is: Observation The patient will require care spanning > 2 midnights and should be moved to inpatient because: Need for IV fluids.   Consultants:  None  Procedures:  None  Antimicrobials:   None   Subjective: Patient seen and evaluated today with no new acute complaints or concerns. No acute concerns or events noted overnight.  She denies any further nausea or vomiting or abdominal pain this morning and would like to try a diet.  Objective: Vitals:   11/19/21 1704 11/19/21 2129 11/20/21 0150 11/20/21 0552  BP: (!) 154/109 123/80 (!) 140/92 122/79  Pulse: (!) 102 (!) 102 85 90  Resp: '20 20 20 20  '$ Temp: 98.8 F (37.1 C) 97.9 F (36.6 C) 98.5 F (36.9 C) 98.7 F (37.1 C)  TempSrc: Oral   Oral  SpO2: 100% 91% 100% 91%  Weight: 94.5 kg     Height: '5\' 2"'$  (1.575 m)       Intake/Output Summary (Last 24 hours) at 11/20/2021 0853 Last data filed at 11/20/2021 0300 Gross per 24 hour  Intake 0 ml  Output 400 ml  Net -400 ml   Filed Weights   11/19/21 1425 11/19/21 1704  Weight: 97.5 kg 94.5 kg    Examination:  General exam: Appears calm and comfortable  Respiratory system: Clear to auscultation. Respiratory effort normal. Cardiovascular system: S1 & S2 heard, RRR.  Gastrointestinal system: Abdomen is soft Central nervous system: Alert and awake Extremities: No edema Skin: No significant lesions noted Psychiatry: Flat affect.    Data Reviewed: I have personally reviewed following labs and imaging studies  CBC: Recent Labs  Lab 11/19/21 1435 11/20/21 0400  WBC 17.8* 14.0*  NEUTROABS 14.0*  --   HGB 17.9* 14.0  HCT 50.2* 41.4  MCV 85.2 91.0  PLT 402* 284  Basic Metabolic Panel: Recent Labs  Lab 11/19/21 1435 11/20/21 0400  NA 135 140  K 2.2* 2.9*  CL 87* 98  CO2 22 28  GLUCOSE 221* 111*  BUN 56* 54*  CREATININE 4.38* 2.85*  CALCIUM 9.9 8.2*  MG 2.2  --    GFR: Estimated Creatinine Clearance: 25.6 mL/min (A) (by C-G formula based on SCr of 2.85 mg/dL (H)). Liver Function Tests: Recent Labs  Lab 11/19/21 1435  AST 35  ALT 33  ALKPHOS 85  BILITOT 1.1  PROT 9.3*  ALBUMIN 5.2*   Recent Labs  Lab 11/19/21 1435  LIPASE 32   No  results for input(s): "AMMONIA" in the last 168 hours. Coagulation Profile: No results for input(s): "INR", "PROTIME" in the last 168 hours. Cardiac Enzymes: No results for input(s): "CKTOTAL", "CKMB", "CKMBINDEX", "TROPONINI" in the last 168 hours. BNP (last 3 results) No results for input(s): "PROBNP" in the last 8760 hours. HbA1C: No results for input(s): "HGBA1C" in the last 72 hours. CBG: No results for input(s): "GLUCAP" in the last 168 hours. Lipid Profile: No results for input(s): "CHOL", "HDL", "LDLCALC", "TRIG", "CHOLHDL", "LDLDIRECT" in the last 72 hours. Thyroid Function Tests: No results for input(s): "TSH", "T4TOTAL", "FREET4", "T3FREE", "THYROIDAB" in the last 72 hours. Anemia Panel: No results for input(s): "VITAMINB12", "FOLATE", "FERRITIN", "TIBC", "IRON", "RETICCTPCT" in the last 72 hours. Sepsis Labs: No results for input(s): "PROCALCITON", "LATICACIDVEN" in the last 168 hours.  No results found for this or any previous visit (from the past 240 hour(s)).       Radiology Studies: DG Chest Portable 1 View  Result Date: 11/19/2021 CLINICAL DATA:  Nausea and vomiting for 4 days. EXAM: PORTABLE CHEST 1 VIEW COMPARISON:  Two-view chest x-ray 01/24/2013 FINDINGS: Heart size is normal. Lungs are clear. No nodule or mass lesion is present. No focal airspace disease present. No edema or effusion. IMPRESSION: Negative one-view chest x-ray. Electronically Signed   By: San Morelle M.D.   On: 11/19/2021 15:28        Scheduled Meds:  heparin  5,000 Units Subcutaneous Q8H   pantoprazole (PROTONIX) IV  40 mg Intravenous Q24H   Continuous Infusions:  0.9 % NaCl with KCl 40 mEq / L       LOS: 0 days    Time spent: 35 minutes    Kandra Graven Darleen Crocker, DO [facility]  If 7PM-7AM, please contact night-coverage [email or web address] 11/20/2021, 8:53 AM

## 2021-11-21 DIAGNOSIS — N179 Acute kidney failure, unspecified: Secondary | ICD-10-CM | POA: Diagnosis not present

## 2021-11-21 LAB — BASIC METABOLIC PANEL
Anion gap: 8 (ref 5–15)
BUN: 30 mg/dL — ABNORMAL HIGH (ref 6–20)
CO2: 27 mmol/L (ref 22–32)
Calcium: 8 mg/dL — ABNORMAL LOW (ref 8.9–10.3)
Chloride: 107 mmol/L (ref 98–111)
Creatinine, Ser: 1.45 mg/dL — ABNORMAL HIGH (ref 0.44–1.00)
GFR, Estimated: 44 mL/min — ABNORMAL LOW (ref >60)
Glucose, Bld: 95 mg/dL (ref 70–99)
Potassium: 3.3 mmol/L — ABNORMAL LOW (ref 3.5–5.1)
Sodium: 142 mmol/L (ref 135–145)

## 2021-11-21 LAB — CBC
HCT: 39.9 % (ref 36.0–46.0)
Hemoglobin: 13.1 g/dL (ref 12.0–15.0)
MCH: 30.4 pg (ref 26.0–34.0)
MCHC: 32.8 g/dL (ref 30.0–36.0)
MCV: 92.6 fL (ref 80.0–100.0)
Platelets: 227 10*3/uL (ref 150–400)
RBC: 4.31 MIL/uL (ref 3.87–5.11)
RDW: 14.3 % (ref 11.5–15.5)
WBC: 9.1 10*3/uL (ref 4.0–10.5)
nRBC: 0 % (ref 0.0–0.2)

## 2021-11-21 LAB — MAGNESIUM: Magnesium: 2.3 mg/dL (ref 1.7–2.4)

## 2021-11-21 MED ORDER — POTASSIUM CHLORIDE CRYS ER 20 MEQ PO TBCR
40.0000 meq | EXTENDED_RELEASE_TABLET | Freq: Once | ORAL | Status: AC
Start: 2021-11-21 — End: 2021-11-21
  Administered 2021-11-21: 40 meq via ORAL
  Filled 2021-11-21: qty 2

## 2021-11-21 NOTE — Progress Notes (Signed)
Discharge instructions given patient verbalized understanding. Patient discharged by ambulation with nurse to private vehicle. IV removed.

## 2021-11-21 NOTE — Discharge Summary (Signed)
Physician Discharge Summary  Beverly Elliott YNW:[id number] DOB: 1970-05-24 DOA: 11/19/2021  PCP: [place], [place] date: 11/19/2021  Discharge date: 11/21/2021  Admitted From:Home  Disposition:  Home  Recommendations for Outpatient Follow-up:  Follow up with PCP in 1-2 weeks Please obtain repeat BMP to ensure creatinine levels have normalized in 1 week Continue other home medications as prior Encouraged cessation of marijuana use  Home Health: None  Equipment/Devices: None  Discharge Condition:Stable  CODE STATUS: Full  Diet recommendation: Heart Healthy  Brief/Interim Summary: Beverly Elliott is a 51 y.o. female with medical history significant for depression, OSA, anxiety. Patient presented to the ED with complaints of nausea and vomiting of 4 days duration.  She was admitted for AKI and severe hypokalemia in the setting of intractable nausea and vomiting likely secondary to cannabis hyperemesis.  She was given IV fluids over 48-hour timeframe with improvement in her renal function as well as potassium levels.  She is now tolerating diet with no further issues or concerns and is stable for discharge today.  No other acute events noted.  Discharge Diagnoses:  Principal Problem:   AKI (acute kidney injury) ([place]) Active Problems:   Intractable vomiting   Hypokalemia   Anxiety and depression   Essential hypertension   Prolonged QT interval  Principal discharge diagnosis: Prerenal AKI in the setting of intractable nausea and vomiting due to cannabis hyperemesis.  Associated hypokalemia due to GI losses.  Discharge Instructions  Discharge Instructions     Diet - low sodium heart healthy   Complete by: As directed    Increase activity slowly   Complete by: As directed       Allergies as of 11/21/2021       Reactions   Latex Swelling   Metoclopramide Nausea And Vomiting   Vomited for 8 hours   Metoclopramide Hcl Nausea And Vomiting    Vomited for 8 hours   Tape Rash   Adhesive tape        Medication List     TAKE these medications    amLODipine 10 MG tablet Commonly known as: NORVASC Take 10 mg by mouth at bedtime.   buPROPion 300 MG 24 hr tablet Commonly known as: WELLBUTRIN XL Take 300 mg by mouth daily.   dicyclomine 20 MG tablet Commonly known as: BENTYL Take 20 mg by mouth daily as needed for spasms.   dimenhyDRINATE 50 MG tablet Commonly known as: DRAMAMINE Take 50 mg by mouth every 8 (eight) hours as needed.   EQ Aspirin Adult Low Dose 81 MG tablet Generic drug: aspirin EC Take 81 mg by mouth daily.   escitalopram 20 MG tablet Commonly known as: LEXAPRO Take 20 mg by mouth at bedtime.   hydrochlorothiazide 25 MG tablet Commonly known as: HYDRODIURIL Take 25 mg by mouth daily.   Magnesium Oxide (Antacid) 400 MG Tabs Take 0.5 tablets by mouth daily.   metFORMIN 500 MG 24 hr tablet Commonly known as: GLUCOPHAGE-XR Take 500 mg by mouth every morning.   pantoprazole 40 MG tablet Commonly known as: PROTONIX Take 1 tablet (40 mg total) by mouth at bedtime.   pravastatin 40 MG tablet Commonly known as: PRAVACHOL Take 40 mg by mouth at bedtime.   promethazine 25 MG suppository Commonly known as: PHENERGAN Place 25 mg rectally every 6 (six) hours as needed for nausea or vomiting.        Follow-up Information     [place]. Schedule an appointment  as soon as possible for a visit.   Specialty: Family Medicine Why: As needed Contact information: [address] [place] [address] [place] [address] [phone]                Allergies  Allergen Reactions   Latex Swelling   Metoclopramide Nausea And Vomiting    Vomited for 8 hours    Metoclopramide Hcl Nausea And Vomiting    Vomited for 8 hours   Tape Rash    Adhesive tape    Consultations: None   Procedures/Studies: DG Chest Portable 1 View  Result Date: 11/19/2021 CLINICAL DATA:   Nausea and vomiting for 4 days. EXAM: PORTABLE CHEST 1 VIEW COMPARISON:  Two-view chest x-ray 01/24/2013 FINDINGS: Heart size is normal. Lungs are clear. No nodule or mass lesion is present. No focal airspace disease present. No edema or effusion. IMPRESSION: Negative one-view chest x-ray. Electronically Signed   By: San Morelle M.D.   On: 11/19/2021 15:28     Discharge Exam: Vitals:   11/20/21 2112 11/21/21 0523  BP: 126/82 (!) 121/91  Pulse: 79 78  Resp: 20 16  Temp: 98.4 F (36.9 C) 98 F (36.7 C)  SpO2: 99% 98%   Vitals:   11/20/21 0552 11/20/21 1324 11/20/21 2112 11/21/21 0523  BP: 122/79 122/87 126/82 (!) 121/91  Pulse: 90 72 79 78  Resp: '20 20 20 16  '$ Temp: 98.7 F (37.1 C) 98 F (36.7 C) 98.4 F (36.9 C) 98 F (36.7 C)  TempSrc: Oral Oral Oral   SpO2: 91% 100% 99% 98%  Weight:      Height:        General: Pt is alert, awake, not in acute distress Cardiovascular: RRR, S1/S2 +, no rubs, no gallops Respiratory: CTA bilaterally, no wheezing, no rhonchi Abdominal: Soft, NT, ND, bowel sounds + Extremities: no edema, no cyanosis    The results of significant diagnostics from this hospitalization (including imaging, microbiology, ancillary and laboratory) are listed below for reference.     Microbiology: No results found for this or any previous visit (from the past 240 hour(s)).   Labs: BNP (last 3 results) No results for input(s): "BNP" in the last 8760 hours. Basic Metabolic Panel: Recent Labs  Lab 11/19/21 1435 11/20/21 0400 11/21/21 0535  NA 135 140 142  K 2.2* 2.9* 3.3*  CL 87* 98 107  CO2 '22 28 27  '$ GLUCOSE 221* 111* 95  BUN 56* 54* 30*  CREATININE 4.38* 2.85* 1.45*  CALCIUM 9.9 8.2* 8.0*  MG 2.2  --  2.3   Liver Function Tests: Recent Labs  Lab 11/19/21 1435  AST 35  ALT 33  ALKPHOS 85  BILITOT 1.1  PROT 9.3*  ALBUMIN 5.2*   Recent Labs  Lab 11/19/21 1435  LIPASE 32   No results for input(s): "AMMONIA" in the last 168  hours. CBC: Recent Labs  Lab 11/19/21 1435 11/20/21 0400 11/21/21 0535  WBC 17.8* 14.0* 9.1  NEUTROABS 14.0*  --   --   HGB 17.9* 14.0 13.1  HCT 50.2* 41.4 39.9  MCV 85.2 91.0 92.6  PLT 402* 284 227   Cardiac Enzymes: No results for input(s): "CKTOTAL", "CKMB", "CKMBINDEX", "TROPONINI" in the last 168 hours. BNP: Invalid input(s): "POCBNP" CBG: No results for input(s): "GLUCAP" in the last 168 hours. D-Dimer No results for input(s): "DDIMER" in the last 72 hours. Hgb A1c No results for input(s): "HGBA1C" in the last 72 hours. Lipid Profile No results for input(s): "CHOL", "HDL", "LDLCALC", "  TRIG", "CHOLHDL", "LDLDIRECT" in the last 72 hours. Thyroid function studies No results for input(s): "TSH", "T4TOTAL", "T3FREE", "THYROIDAB" in the last 72 hours.  Invalid input(s): "FREET3" Anemia work up No results for input(s): "VITAMINB12", "FOLATE", "FERRITIN", "TIBC", "IRON", "RETICCTPCT" in the last 72 hours. Urinalysis    Component Value Date/Time   COLORURINE YELLOW 05/27/2021 2209   APPEARANCEUR CLOUDY (A) 05/27/2021 2209   LABSPEC >1.030 (H) 05/27/2021 2209   PHURINE 6.0 05/27/2021 2209   GLUCOSEU NEGATIVE 05/27/2021 2209   HGBUR SMALL (A) 05/27/2021 2209   BILIRUBINUR NEGATIVE 05/27/2021 2209   KETONESUR 15 (A) 05/27/2021 2209   PROTEINUR NEGATIVE 05/27/2021 2209   UROBILINOGEN 0.2 09/03/2013 1353   NITRITE NEGATIVE 05/27/2021 2209   LEUKOCYTESUR NEGATIVE 05/27/2021 2209   Sepsis Labs Recent Labs  Lab 11/19/21 1435 11/20/21 0400 11/21/21 0535  WBC 17.8* 14.0* 9.1   Microbiology No results found for this or any previous visit (from the past 240 hour(s)).   Time coordinating discharge: 35 minutes  SIGNED:   Rodena Goldmann, DO [facility] 11/21/2021, 9:17 AM  If 7PM-7AM, please contact night-coverage [email or web address]

## 2021-11-24 LAB — CBC
HCT: 41.4 % (ref 36.0–46.0)
Hemoglobin: 14 g/dL (ref 12.0–15.0)
MCH: 30.8 pg (ref 26.0–34.0)
MCHC: 33.8 g/dL (ref 30.0–36.0)
MCV: 91 fL (ref 80.0–100.0)
Platelets: 284 10*3/uL (ref 150–400)
RBC: 4.55 MIL/uL (ref 3.87–5.11)
RDW: 14.6 % (ref 11.5–15.5)
WBC: 14 10*3/uL — ABNORMAL HIGH (ref 4.0–10.5)
nRBC: 0 % (ref 0.0–0.2)

## 2022-01-11 ENCOUNTER — Encounter (HOSPITAL_COMMUNITY): Payer: Self-pay

## 2022-01-11 ENCOUNTER — Other Ambulatory Visit: Payer: Self-pay

## 2022-01-11 ENCOUNTER — Inpatient Hospital Stay (HOSPITAL_COMMUNITY)
Admission: EM | Admit: 2022-01-11 | Discharge: 2022-01-13 | DRG: 683 | Disposition: A | Discharge status: Home / Self Care | Payer: PRIVATE HEALTH INSURANCE | Attending: Family Medicine | Admitting: Family Medicine

## 2022-01-11 DIAGNOSIS — K648 Other hemorrhoids: Secondary | ICD-10-CM | POA: Diagnosis present

## 2022-01-11 DIAGNOSIS — N179 Acute kidney failure, unspecified: Principal | ICD-10-CM | POA: Diagnosis present

## 2022-01-11 DIAGNOSIS — E872 Acidosis, unspecified: Secondary | ICD-10-CM | POA: Diagnosis present

## 2022-01-11 DIAGNOSIS — J45909 Unspecified asthma, uncomplicated: Secondary | ICD-10-CM | POA: Diagnosis present

## 2022-01-11 DIAGNOSIS — Z888 Allergy status to other drugs, medicaments and biological substances status: Secondary | ICD-10-CM

## 2022-01-11 DIAGNOSIS — F410 Panic disorder [episodic paroxysmal anxiety] without agoraphobia: Secondary | ICD-10-CM | POA: Diagnosis present

## 2022-01-11 DIAGNOSIS — E861 Hypovolemia: Secondary | ICD-10-CM | POA: Diagnosis present

## 2022-01-11 DIAGNOSIS — E860 Dehydration: Secondary | ICD-10-CM | POA: Diagnosis present

## 2022-01-11 DIAGNOSIS — E785 Hyperlipidemia, unspecified: Secondary | ICD-10-CM | POA: Diagnosis present

## 2022-01-11 DIAGNOSIS — Z79899 Other long term (current) drug therapy: Secondary | ICD-10-CM

## 2022-01-11 DIAGNOSIS — E669 Obesity, unspecified: Secondary | ICD-10-CM | POA: Diagnosis present

## 2022-01-11 DIAGNOSIS — R1115 Cyclical vomiting syndrome unrelated to migraine: Secondary | ICD-10-CM | POA: Diagnosis present

## 2022-01-11 DIAGNOSIS — F411 Generalized anxiety disorder: Secondary | ICD-10-CM | POA: Diagnosis present

## 2022-01-11 DIAGNOSIS — Z7984 Long term (current) use of oral hypoglycemic drugs: Secondary | ICD-10-CM

## 2022-01-11 DIAGNOSIS — D72829 Elevated white blood cell count, unspecified: Secondary | ICD-10-CM

## 2022-01-11 DIAGNOSIS — R9431 Abnormal electrocardiogram [ECG] [EKG]: Secondary | ICD-10-CM | POA: Diagnosis present

## 2022-01-11 DIAGNOSIS — E876 Hypokalemia: Secondary | ICD-10-CM | POA: Diagnosis present

## 2022-01-11 DIAGNOSIS — Z833 Family history of diabetes mellitus: Secondary | ICD-10-CM

## 2022-01-11 DIAGNOSIS — Z8249 Family history of ischemic heart disease and other diseases of the circulatory system: Secondary | ICD-10-CM

## 2022-01-11 DIAGNOSIS — E1165 Type 2 diabetes mellitus with hyperglycemia: Secondary | ICD-10-CM | POA: Diagnosis present

## 2022-01-11 DIAGNOSIS — K219 Gastro-esophageal reflux disease without esophagitis: Secondary | ICD-10-CM | POA: Diagnosis present

## 2022-01-11 DIAGNOSIS — K589 Irritable bowel syndrome without diarrhea: Secondary | ICD-10-CM | POA: Diagnosis present

## 2022-01-11 DIAGNOSIS — I10 Essential (primary) hypertension: Secondary | ICD-10-CM | POA: Diagnosis present

## 2022-01-11 DIAGNOSIS — K5900 Constipation, unspecified: Secondary | ICD-10-CM | POA: Diagnosis present

## 2022-01-11 DIAGNOSIS — Z6838 Body mass index (BMI) 38.0-38.9, adult: Secondary | ICD-10-CM

## 2022-01-11 DIAGNOSIS — R1110 Vomiting, unspecified: Secondary | ICD-10-CM | POA: Diagnosis present

## 2022-01-11 DIAGNOSIS — Z28310 Unvaccinated for covid-19: Secondary | ICD-10-CM

## 2022-01-11 DIAGNOSIS — Z91040 Latex allergy status: Secondary | ICD-10-CM

## 2022-01-11 DIAGNOSIS — Z87891 Personal history of nicotine dependence: Secondary | ICD-10-CM

## 2022-01-11 LAB — URINALYSIS, ROUTINE W REFLEX MICROSCOPIC
Bacteria, UA: NONE SEEN
Bilirubin Urine: NEGATIVE
Glucose, UA: NEGATIVE mg/dL
Ketones, ur: 20 mg/dL — AB
Leukocytes,Ua: NEGATIVE
Nitrite: NEGATIVE
Protein, ur: 100 mg/dL — AB
Specific Gravity, Urine: 1.021 (ref 1.005–1.030)
WBC, UA: >50 WBC/hpf — ABNORMAL HIGH (ref 0–5)
pH: 5 (ref 5.0–8.0)

## 2022-01-11 LAB — COMPREHENSIVE METABOLIC PANEL
ALT: 31 U/L (ref 0–44)
ALT: 28 U/L (ref 0–44)
AST: 29 U/L (ref 15–41)
AST: 29 U/L (ref 15–41)
Albumin: 4.9 g/dL (ref 3.5–5.0)
Albumin: 4.8 g/dL (ref 3.5–5.0)
Alkaline Phosphatase: 72 U/L (ref 38–126)
Alkaline Phosphatase: 68 U/L (ref 38–126)
Anion gap: 20 — ABNORMAL HIGH (ref 5–15)
Anion gap: 17 — ABNORMAL HIGH (ref 5–15)
BUN: 27 mg/dL — ABNORMAL HIGH (ref 6–20)
BUN: 26 mg/dL — ABNORMAL HIGH (ref 6–20)
CO2: 23 mmol/L (ref 22–32)
CO2: 25 mmol/L (ref 22–32)
Calcium: 9.7 mg/dL (ref 8.9–10.3)
Calcium: 9.6 mg/dL (ref 8.9–10.3)
Chloride: 94 mmol/L — ABNORMAL LOW (ref 98–111)
Chloride: 97 mmol/L — ABNORMAL LOW (ref 98–111)
Creatinine, Ser: 1.78 mg/dL — ABNORMAL HIGH (ref 0.44–1.00)
Creatinine, Ser: 1.51 mg/dL — ABNORMAL HIGH (ref 0.44–1.00)
GFR, Estimated: 35 mL/min — ABNORMAL LOW (ref >60)
GFR, Estimated: 42 mL/min — ABNORMAL LOW (ref >60)
Glucose, Bld: 236 mg/dL — ABNORMAL HIGH (ref 70–99)
Glucose, Bld: 155 mg/dL — ABNORMAL HIGH (ref 70–99)
Potassium: 2.2 mmol/L — CL (ref 3.5–5.1)
Potassium: 2.4 mmol/L — CL (ref 3.5–5.1)
Sodium: 137 mmol/L (ref 135–145)
Sodium: 139 mmol/L (ref 135–145)
Total Bilirubin: 0.9 mg/dL (ref 0.3–1.2)
Total Bilirubin: 0.8 mg/dL (ref 0.3–1.2)
Total Protein: 8.7 g/dL — ABNORMAL HIGH (ref 6.5–8.1)
Total Protein: 8.4 g/dL — ABNORMAL HIGH (ref 6.5–8.1)

## 2022-01-11 LAB — CBC WITH DIFFERENTIAL/PLATELET
Abs Immature Granulocytes: 0.09 10*3/uL — ABNORMAL HIGH (ref 0.00–0.07)
Abs Immature Granulocytes: 0.1 10*3/uL — ABNORMAL HIGH (ref 0.00–0.07)
Basophils Absolute: 0 10*3/uL (ref 0.0–0.1)
Basophils Absolute: 0 10*3/uL (ref 0.0–0.1)
Basophils Relative: 0 %
Basophils Relative: 0 %
Eosinophils Absolute: 0 10*3/uL (ref 0.0–0.5)
Eosinophils Absolute: 0 10*3/uL (ref 0.0–0.5)
Eosinophils Relative: 0 %
Eosinophils Relative: 0 %
HCT: 45.3 % (ref 36.0–46.0)
HCT: 44.3 % (ref 36.0–46.0)
Hemoglobin: 16 g/dL — ABNORMAL HIGH (ref 12.0–15.0)
Hemoglobin: 15.4 g/dL — ABNORMAL HIGH (ref 12.0–15.0)
Immature Granulocytes: 1 %
Immature Granulocytes: 1 %
Lymphocytes Relative: 18 %
Lymphocytes Relative: 15 %
Lymphs Abs: 2.9 10*3/uL (ref 0.7–4.0)
Lymphs Abs: 2.4 10*3/uL (ref 0.7–4.0)
MCH: 31.4 pg (ref 26.0–34.0)
MCH: 30.9 pg (ref 26.0–34.0)
MCHC: 35.3 g/dL (ref 30.0–36.0)
MCHC: 34.8 g/dL (ref 30.0–36.0)
MCV: 88.8 fL (ref 80.0–100.0)
MCV: 88.8 fL (ref 80.0–100.0)
Monocytes Absolute: 1.1 10*3/uL — ABNORMAL HIGH (ref 0.1–1.0)
Monocytes Absolute: 1.2 10*3/uL — ABNORMAL HIGH (ref 0.1–1.0)
Monocytes Relative: 7 %
Monocytes Relative: 7 %
Neutro Abs: 12.2 10*3/uL — ABNORMAL HIGH (ref 1.7–7.7)
Neutro Abs: 12.5 10*3/uL — ABNORMAL HIGH (ref 1.7–7.7)
Neutrophils Relative %: 74 %
Neutrophils Relative %: 77 %
Platelets: 399 10*3/uL (ref 150–400)
Platelets: 377 10*3/uL (ref 150–400)
RBC: 5.1 MIL/uL (ref 3.87–5.11)
RBC: 4.99 MIL/uL (ref 3.87–5.11)
RDW: 13.4 % (ref 11.5–15.5)
RDW: 13.5 % (ref 11.5–15.5)
WBC: 16.2 10*3/uL — ABNORMAL HIGH (ref 4.0–10.5)
WBC: 16.2 10*3/uL — ABNORMAL HIGH (ref 4.0–10.5)
nRBC: 0 % (ref 0.0–0.2)
nRBC: 0 % (ref 0.0–0.2)

## 2022-01-11 LAB — MAGNESIUM
Magnesium: 2.2 mg/dL (ref 1.7–2.4)
Magnesium: 2.4 mg/dL (ref 1.7–2.4)

## 2022-01-11 LAB — GLUCOSE, CAPILLARY
Glucose-Capillary: 120 mg/dL — ABNORMAL HIGH (ref 70–99)
Glucose-Capillary: 105 mg/dL — ABNORMAL HIGH (ref 70–99)
Glucose-Capillary: 107 mg/dL — ABNORMAL HIGH (ref 70–99)
Glucose-Capillary: 94 mg/dL (ref 70–99)

## 2022-01-11 LAB — LIPASE, BLOOD: Lipase: 28 U/L (ref 11–51)

## 2022-01-11 LAB — LACTIC ACID, PLASMA: Lactic Acid, Venous: 2.4 mmol/L (ref 0.5–1.9)

## 2022-01-11 LAB — RAPID URINE DRUG SCREEN, HOSP PERFORMED
Amphetamines: NOT DETECTED
Barbiturates: NOT DETECTED
Benzodiazepines: NOT DETECTED
Cocaine: NOT DETECTED
Opiates: NOT DETECTED
Tetrahydrocannabinol: POSITIVE — AB

## 2022-01-11 LAB — HEMOGLOBIN A1C
Hgb A1c MFr Bld: 5.8 % — ABNORMAL HIGH (ref 4.8–5.6)
Mean Plasma Glucose: 119.76 mg/dL

## 2022-01-11 MED ORDER — POTASSIUM CHLORIDE IN NACL 40-0.9 MEQ/L-% IV SOLN
INTRAVENOUS | Status: DC
  Filled 2022-01-11 (×3): qty 1000

## 2022-01-11 MED ORDER — LACTATED RINGERS IV BOLUS
1000.0000 mL | Freq: Once | INTRAVENOUS | Status: AC
  Administered 2022-01-11: 1000 mL via INTRAVENOUS

## 2022-01-11 MED ORDER — ONDANSETRON HCL 4 MG/2ML IJ SOLN
4.0000 mg | Freq: Once | INTRAMUSCULAR | Status: AC
Start: 2022-01-11 — End: 2022-01-11
  Administered 2022-01-11: 4 mg via INTRAVENOUS
  Filled 2022-01-11: qty 2

## 2022-01-11 MED ORDER — MORPHINE SULFATE (PF) 2 MG/ML IV SOLN: 2.0000 mg | INTRAVENOUS | Status: DC | PRN

## 2022-01-11 MED ORDER — PRAVASTATIN SODIUM 40 MG PO TABS
40.0000 mg | ORAL_TABLET | Freq: Every day | ORAL | Status: DC
  Administered 2022-01-11: 40 mg via ORAL
  Filled 2022-01-11 (×2): qty 1

## 2022-01-11 MED ORDER — ACETAMINOPHEN 650 MG RE SUPP: 650.0000 mg | Freq: Four times a day (QID) | RECTAL | Status: DC | PRN

## 2022-01-11 MED ORDER — DICYCLOMINE HCL 20 MG PO TABS
20.0000 mg | ORAL_TABLET | Freq: Every day | ORAL | Status: DC | PRN
Start: 2022-01-11 — End: 2022-01-13

## 2022-01-11 MED ORDER — OXYCODONE HCL 5 MG PO TABS: 5.0000 mg | ORAL_TABLET | ORAL | Status: DC | PRN

## 2022-01-11 MED ORDER — ACETAMINOPHEN 325 MG PO TABS: 650.0000 mg | ORAL_TABLET | Freq: Four times a day (QID) | ORAL | Status: DC | PRN

## 2022-01-11 MED ORDER — BUPROPION HCL ER (XL) 300 MG PO TB24
300.0000 mg | ORAL_TABLET | Freq: Every day | ORAL | Status: DC
  Administered 2022-01-11 – 2022-01-13 (×3): 300 mg via ORAL
  Filled 2022-01-11 (×3): qty 1

## 2022-01-11 MED ORDER — PROCHLORPERAZINE EDISYLATE 10 MG/2ML IJ SOLN
10.0000 mg | Freq: Four times a day (QID) | INTRAMUSCULAR | Status: DC | PRN
Start: 2022-01-11 — End: 2022-01-13
  Administered 2022-01-11 – 2022-01-12 (×3): 10 mg via INTRAVENOUS
  Filled 2022-01-11 (×3): qty 2

## 2022-01-11 MED ORDER — AMLODIPINE BESYLATE 5 MG PO TABS
10.0000 mg | ORAL_TABLET | Freq: Every day | ORAL | Status: DC
  Administered 2022-01-11: 10 mg via ORAL
  Filled 2022-01-11 (×2): qty 2

## 2022-01-11 MED ORDER — INSULIN ASPART 100 UNIT/ML IJ SOLN
0.0000 [IU] | Freq: Three times a day (TID) | INTRAMUSCULAR | Status: DC
  Administered 2022-01-12 (×2): 2 [IU] via SUBCUTANEOUS
  Administered 2022-01-12: 3 [IU] via SUBCUTANEOUS

## 2022-01-11 MED ORDER — INSULIN ASPART 100 UNIT/ML IJ SOLN: 0.0000 [IU] | Freq: Every day | INTRAMUSCULAR | Status: DC

## 2022-01-11 MED ORDER — ESCITALOPRAM OXALATE 10 MG PO TABS
20.0000 mg | ORAL_TABLET | Freq: Every day | ORAL | Status: DC
  Administered 2022-01-11: 20 mg via ORAL
  Filled 2022-01-11 (×2): qty 2

## 2022-01-11 MED ORDER — ASPIRIN 81 MG PO TBEC
81.0000 mg | DELAYED_RELEASE_TABLET | Freq: Every day | ORAL | Status: DC
  Administered 2022-01-11 – 2022-01-13 (×3): 81 mg via ORAL
  Filled 2022-01-11 (×3): qty 1

## 2022-01-11 MED ORDER — POTASSIUM CHLORIDE 10 MEQ/100ML IV SOLN
10.0000 meq | INTRAVENOUS | Status: AC
  Administered 2022-01-11 (×4): 10 meq via INTRAVENOUS
  Filled 2022-01-11: qty 100

## 2022-01-11 MED ORDER — PANTOPRAZOLE SODIUM 40 MG PO TBEC
40.0000 mg | DELAYED_RELEASE_TABLET | Freq: Every day | ORAL | Status: DC
  Administered 2022-01-11: 40 mg via ORAL
  Filled 2022-01-11 (×2): qty 1

## 2022-01-11 MED ORDER — PROMETHAZINE HCL 25 MG/ML IJ SOLN
INTRAMUSCULAR | Status: AC
  Filled 2022-01-11: qty 1

## 2022-01-11 MED ORDER — HEPARIN SODIUM (PORCINE) 5000 UNIT/ML IJ SOLN
5000.0000 [IU] | Freq: Three times a day (TID) | INTRAMUSCULAR | Status: DC
  Administered 2022-01-11 – 2022-01-13 (×8): 5000 [IU] via SUBCUTANEOUS
  Filled 2022-01-11 (×8): qty 1

## 2022-01-11 MED ORDER — POTASSIUM CHLORIDE CRYS ER 20 MEQ PO TBCR
40.0000 meq | EXTENDED_RELEASE_TABLET | Freq: Once | ORAL | Status: AC
Start: 2022-01-11 — End: 2022-01-11
  Administered 2022-01-11: 40 meq via ORAL
  Filled 2022-01-11: qty 2

## 2022-01-11 MED ORDER — SODIUM CHLORIDE 0.9 % IV SOLN
25.0000 mg | Freq: Four times a day (QID) | INTRAVENOUS | Status: DC | PRN
  Administered 2022-01-11: 25 mg via INTRAVENOUS
  Filled 2022-01-11: qty 1

## 2022-01-11 NOTE — ED Notes (Signed)
Date and time results received: 01/11/22 0236   Test: K+ Critical Value: 2.2  Name of Provider Notified: Betsey Holiday, MD

## 2022-01-11 NOTE — Assessment & Plan Note (Signed)
Continue PPI ?

## 2022-01-11 NOTE — Assessment & Plan Note (Signed)
-   Likely reactive - UA pending - Trend in the a.m.

## 2022-01-11 NOTE — ED Provider Notes (Signed)
[facility] EMERGENCY DEPARTMENT Provider Note   CSN: [id number] Arrival date & time: 01/11/22  0104     History  Chief Complaint  Patient presents with   Emesis    Beverly Elliott is a 51 y.o. female.  Presents to the emergency department for evaluation of nausea and vomiting.  Patient reports that she has a history of irritable bowel syndrome with cyclic vomiting syndrome.  She reports that she has had recurrent episodes similar to this in the past.  Patient experiencing severe nausea with intractable vomiting.  She reports that she cannot hold anything down.       Home Medications Prior to Admission medications   Medication Sig Start Date End Date Taking? Authorizing Provider  amLODipine (NORVASC) 10 MG tablet Take 10 mg by mouth at bedtime. 12/29/20   [provider]  buPROPion (WELLBUTRIN XL) 300 MG 24 hr tablet Take 300 mg by mouth daily. 09/07/21   [provider]  dicyclomine (BENTYL) 20 MG tablet Take 20 mg by mouth daily as needed for spasms. 03/14/17   [provider]  dimenhyDRINATE (DRAMAMINE) 50 MG tablet Take 50 mg by mouth every 8 (eight) hours as needed.    [provider]  EQ ASPIRIN ADULT LOW DOSE 81 MG EC tablet Take 81 mg by mouth daily. 04/15/21   [provider]  escitalopram (LEXAPRO) 20 MG tablet Take 20 mg by mouth at bedtime. 05/08/21   [provider]  hydrochlorothiazide (HYDRODIURIL) 25 MG tablet Take 25 mg by mouth daily. 06/10/21   [provider]  Magnesium Oxide, Antacid, 400 MG TABS Take 0.5 tablets by mouth daily. 08/08/21   [provider]  metFORMIN (GLUCOPHAGE-XR) 500 MG 24 hr tablet Take 500 mg by mouth every morning. 11/13/21   [provider]  pantoprazole (PROTONIX) 40 MG tablet Take 1 tablet (40 mg total) by mouth at bedtime. 05/28/21   Mesner, Corene Cornea, MD  pravastatin (PRAVACHOL) 40 MG tablet Take 40 mg by mouth at bedtime. 05/10/21   [provider]  promethazine  (PHENERGAN) 25 MG suppository Place 25 mg rectally every 6 (six) hours as needed for nausea or vomiting.    [provider]      Allergies    Latex, Metoclopramide, Metoclopramide hcl, and Tape    Review of Systems   Review of Systems  Physical Exam Updated Vital Signs BP (!) 154/103   Pulse (!) 131   Temp 98.2 F (36.8 C) (Oral)   Resp 20   Ht '5\' 2"'$  (1.575 m)   Wt 95 kg   SpO2 100%   BMI 38.31 kg/m  Physical Exam Vitals and nursing note reviewed.  Constitutional:      General: She is not in acute distress.    Appearance: She is well-developed.  HENT:     Head: Normocephalic and atraumatic.     Mouth/Throat:     Mouth: Mucous membranes are moist.  Eyes:     General: Vision grossly intact. Gaze aligned appropriately.     Extraocular Movements: Extraocular movements intact.     Conjunctiva/sclera: Conjunctivae normal.  Cardiovascular:     Rate and Rhythm: Regular rhythm. Tachycardia present.     Pulses: Normal pulses.     Heart sounds: Normal heart sounds, S1 normal and S2 normal. No murmur heard.    No friction rub. No gallop.  Pulmonary:     Effort: Pulmonary effort is normal. No respiratory distress.     Breath sounds: Normal breath sounds.  Abdominal:     General: Bowel sounds are normal.     Palpations: Abdomen is soft.     Tenderness: There is no abdominal tenderness. There is no guarding or rebound.     Hernia: No hernia is present.  Musculoskeletal:        General: No swelling.     Cervical back: Full passive range of motion without pain, normal range of motion and neck supple. No spinous process tenderness or muscular tenderness. Normal range of motion.     Right lower leg: No edema.     Left lower leg: No edema.  Skin:    General: Skin is warm and dry.     Capillary Refill: Capillary refill takes less than 2 seconds.     Findings: No ecchymosis, erythema, rash or wound.  Neurological:     General: No focal deficit present.     Mental Status:  She is alert and oriented to person, place, and time.     GCS: GCS eye subscore is 4. GCS verbal subscore is 5. GCS motor subscore is 6.     Cranial Nerves: Cranial nerves 2-12 are intact.     Sensory: Sensation is intact.     Motor: Motor function is intact.     Coordination: Coordination is intact.  Psychiatric:        Attention and Perception: Attention normal.        Mood and Affect: Mood normal.        Speech: Speech normal.        Behavior: Behavior normal.     ED Results / Procedures / Treatments   Labs (all labs ordered are listed, but only abnormal results are displayed) Labs Reviewed  CBC WITH DIFFERENTIAL/PLATELET - Abnormal; Notable for the following components:      Result Value   WBC 16.2 (*)    Hemoglobin 16.0 (*)    Neutro Abs 12.2 (*)    Monocytes Absolute 1.1 (*)    Abs Immature Granulocytes 0.09 (*)    All other components within normal limits  COMPREHENSIVE METABOLIC PANEL - Abnormal; Notable for the following components:   Potassium 2.2 (*)    Chloride 94 (*)    Glucose, Bld 236 (*)    BUN 27 (*)    Creatinine, Ser 1.78 (*)    Total Protein 8.7 (*)    GFR, Estimated 35 (*)    Anion gap 20 (*)    All other components within normal limits  LIPASE, BLOOD  MAGNESIUM  RAPID URINE DRUG SCREEN, HOSP PERFORMED    EKG EKG Interpretation  Date/Time:  Monday January 11 2022 02:13:27 EDT Ventricular Rate:  98 PR Interval:  144 QRS Duration: 108 QT Interval:  395 QTC Calculation: 505 R Axis:   82 Text Interpretation: Sinus tachycardia Multiform ventricular premature complexes Low voltage, precordial leads Nonspecific repol abnormality, diffuse leads Borderline prolonged QT interval Baseline wander in lead(s) I Confirmed by Orpah Greek [id number]) on 01/11/2022 2:25:31 AM  Radiology No results found.  Procedures Procedures    Medications Ordered in ED Medications  promethazine (PHENERGAN) 25 mg in sodium chloride 0.9 % 50 mL IVPB (0 mg  Intravenous Stopped 01/11/22 0241)  0.9 % NaCl with KCl 40 mEq / L  infusion (has no administration in time range)  lactated ringers bolus 1,000 mL (0 mLs Intravenous Stopped 01/11/22 0241)  ondansetron (ZOFRAN) injection 4 mg (4 mg Intravenous Given 01/11/22 0155)    ED Course/ Medical Decision Making/ A&P  Medical Decision Making Amount and/or Complexity of Data Reviewed Labs: ordered.  Risk Prescription drug management.   Patient presents to the emergency department for evaluation of nausea and vomiting.  Patient reports a history of cyclic vomiting syndrome with similar symptoms.  Patient actively vomiting at arrival, appears uncomfortable.  Patient tachycardic and mildly hypertensive.  Abdominal exam benign, nonfocal.  Patient reports a history of hypokalemia and acute kidney injury secondary to her cyclic vomiting syndrome.  Labs do reveal profound hypokalemia with a slight acute kidney injury.  Patient administered IV fluids upon arrival.  She was given Zofran and Phenergan IV for her nausea and vomiting.  She is, however, noted to have a borderline prolonged QT and will need monitoring for further antiemetics.  Patient will once again require hospitalization for hydration and electrolyte replacement.  CRITICAL CARE Performed by: Orpah Greek   Total critical care time: 33 minutes  Critical care time was exclusive of separately billable procedures and treating other patients.  Critical care was necessary to treat or prevent imminent or life-threatening deterioration.  Critical care was time spent personally by me on the following activities: development of treatment plan with patient and/or surrogate as well as nursing, discussions with consultants, evaluation of patient's response to treatment, examination of patient, obtaining history from patient or surrogate, ordering and performing treatments and interventions, ordering and review of  laboratory studies, ordering and review of radiographic studies, pulse oximetry and re-evaluation of patient's condition.\        Final Clinical Impression(s) / ED Diagnoses Final diagnoses:  Cyclical vomiting syndrome not associated with migraine  Hypokalemia    Rx / DC Orders ED Discharge Orders     None         Orpah Greek, MD 01/11/22 [phone]

## 2022-01-11 NOTE — Progress Notes (Signed)
Per lab, lactic acid 2.4 and Potassium 2.4. Asia Zierle-Ghosh, DO made aware with no new orders given.

## 2022-01-11 NOTE — H&P (Signed)
History and Physical    Patient: Beverly Elliott ZOX:[id number] DOB: 08/23/1970 DOA: 01/11/2022 DOS: the patient was seen and examined on 01/11/2022 PCP: [place] [place]  Patient coming from: Home  Chief Complaint:  Chief Complaint  Patient presents with   Emesis   HPI: Beverly Elliott is a 51 y.o. female with medical history significant of EMEA, depression, GERD, and cyclic vomiting presents to the ED with a chief complaint of vomiting.  Patient reports that started Saturday night.  She has had 20 episodes per day.  It is nonbloody.  Her last normal meal was Saturday at lunchtime.  Patient reports that the trigger for this episode seem to be cramping and straining with her last bowel movement which was on Saturday.  Patient reports that since this started she has not had any further bowel movements.  She has no abdominal pain just the feeling of nausea.  Patient reports no fevers, nothing abnormal in her diet recently, no travel recently.  She reports this feels like her normal cyclic vomiting.  She does have an appointment with GI soon.  Patient reports that she has been having muscle cramps in her sides, ribs, legs.  She denies chest pain.  She admits to palpitations but only when she is vomiting.  Patient has no other complaints.  Patient does not smoke, does not drink, does not use illicit drugs per her report.  She is not vaccinated for COVID.  Patient is full code. Review of Systems: As mentioned in the history of present illness. All other systems reviewed and are negative. Past Medical History:  Diagnosis Date   Anemia    Asthma    Depression    GERD (gastroesophageal reflux disease)    History of chicken pox    IBS (irritable bowel syndrome)    Kidney stones    Migraine    UTI (lower urinary tract infection)    Past Surgical History:  Procedure Laterality Date   ABDOMINAL HYSTERECTOMY     CESAREAN SECTION     TONSILLECTOMY     Social History:   reports that she has quit smoking. Her smoking use included cigarettes. She smoked an average of .8 packs per day. She has never used smokeless tobacco. She reports current alcohol use. She reports current drug use. Drug: Marijuana.  Allergies  Allergen Reactions   Latex Swelling   Metoclopramide Nausea And Vomiting    Vomited for 8 hours    Metoclopramide Hcl Nausea And Vomiting    Vomited for 8 hours   Tape Rash    Adhesive tape    Family History  Problem Relation Age of Onset   Coronary artery disease Mother    Coronary artery disease Father    Heart attack Father    Hypertension Mother    Hypertension Paternal Grandmother    Diabetes Maternal Grandfather     Prior to Admission medications   Medication Sig Start Date End Date Taking? Authorizing Provider  amLODipine (NORVASC) 10 MG tablet Take 10 mg by mouth at bedtime. 12/29/20   [provider]  buPROPion (WELLBUTRIN XL) 300 MG 24 hr tablet Take 300 mg by mouth daily. 09/07/21   [provider]  dicyclomine (BENTYL) 20 MG tablet Take 20 mg by mouth daily as needed for spasms. 03/14/17   [provider]  dimenhyDRINATE (DRAMAMINE) 50 MG tablet Take 50 mg by mouth every 8 (eight) hours as needed.    [provider]  EQ ASPIRIN ADULT LOW  DOSE 81 MG EC tablet Take 81 mg by mouth daily. 04/15/21   [provider]  escitalopram (LEXAPRO) 20 MG tablet Take 20 mg by mouth at bedtime. 05/08/21   [provider]  hydrochlorothiazide (HYDRODIURIL) 25 MG tablet Take 25 mg by mouth daily. 06/10/21   [provider]  Magnesium Oxide, Antacid, 400 MG TABS Take 0.5 tablets by mouth daily. 08/08/21   [provider]  metFORMIN (GLUCOPHAGE-XR) 500 MG 24 hr tablet Take 500 mg by mouth every morning. 11/13/21   [provider]  pantoprazole (PROTONIX) 40 MG tablet Take 1 tablet (40 mg total) by mouth at bedtime. 05/28/21   Mesner, Corene Cornea, MD  pravastatin (PRAVACHOL) 40 MG tablet  Take 40 mg by mouth at bedtime. 05/10/21   [provider]  promethazine (PHENERGAN) 25 MG suppository Place 25 mg rectally every 6 (six) hours as needed for nausea or vomiting.    [provider]    Physical Exam: Vitals:   01/11/22 0126 01/11/22 0127 01/11/22 0130  BP: (!) 184/121  (!) 154/103  Pulse: (!) 121  (!) 131  Resp: 20  20  Temp: 98.2 F (36.8 C)    TempSrc: Oral    SpO2: 100%  100%  Weight:  95 kg   Height:  '5\' 2"'$  (1.575 m)    1.  General: Patient lying right lateral decubitus,  no acute distress   2. Psychiatric: Alert and oriented x 3, mood and behavior normal for situation, pleasant and cooperative with exam   3. Neurologic: Speech and language are normal, face is symmetric, moves all 4 extremities voluntarily, at baseline without acute deficits on limited exam   4. HEENMT:  Head is atraumatic, normocephalic, pupils reactive to light, neck is supple, trachea is midline, mucous membranes are moist   5. Respiratory : Lungs are clear to auscultation bilaterally without wheezing, rhonchi, rales, no cyanosis, no increase in work of breathing or accessory muscle use   6. Cardiovascular : Heart rate tachycardic, rhythm is regular, no murmurs, rubs or gallops, no peripheral edema, peripheral pulses palpated   7. Gastrointestinal:  Abdomen is soft, nondistended, nontender to palpation bowel sounds active, no masses or organomegaly palpated   8. Skin:  Skin is warm, dry and intact without rashes, acute lesions, or ulcers on limited exam   9.Musculoskeletal:  No acute deformities or trauma, no asymmetry in tone, no peripheral edema, peripheral pulses palpated, no tenderness to palpation in the extremities  Data Reviewed: In the ED Temp 98.2, heart rate 121-131, respiratory rate 20, blood pressure 154/ 103-184/121, maintaining oxygen sats on room air Leukocytosis at 16.2, hemoglobin stable at 16 Potassium 2.2, bicarb slightly low at 23, gap 20 BUN  27, creatinine 1.78 Hyperglycemic at 236 EKG shows sinus tachycardia with a heart rate of 98, repolarization abnormalities with prolonged QT Patient was given Zofran, Phenergan, 1 L LR in the ED Calcium ordered Admission requested for further management of electrolyte arrangements and AKI  Assessment and Plan: * AKI (acute kidney injury) ([place]) - Creatinine increased from 1.45>> 1.78 -Secondary to dehydration/GI losses - Hold nephrotoxic agents when possible including holding hydrochlorothiazide - Patient received 1 L bolus in the ED, with and another liter to follow with 40 mEq of potassium - Trend in the a.m.  Intractable vomiting - Intermittently for 12 years - This episode has been going on for 2 days - Continue Compazine as needed for nausea and vomiting in the setting of long QT - Continue to  monitor  Hypokalemia - Potassium 2.2 - 40 mEq and 1 L normal saline infusion - Magnesium stable at 2.2 - Secondary to GI losses and poor p.o. intake - Continue to monitor  Leukocytosis - Likely reactive - UA pending - Trend in the a.m.  Prolonged QT interval - QTc 505 - Most likely secondary to hypokalemia - Monitor on telemetry - Anticipate improvement with replacement of potassium  Generalized anxiety disorder with panic attacks - Continue SSRI  Essential hypertension - Blood pressure as high as 184/121 - Continue Norvasc - Holding hydrochlorothiazide - We will add as needed medication if needed  GERD - Continue PPI      Advance Care Planning:   Code Status: Full Code   Consults: None  Family Communication: No family at bedside  Severity of Illness: The appropriate patient status for this patient is OBSERVATION. Observation status is judged to be reasonable and necessary in order to provide the required intensity of service to ensure the patient's safety. The patient's presenting symptoms, physical exam findings, and initial radiographic and laboratory data in  the context of their medical condition is felt to place them at decreased risk for further clinical deterioration. Furthermore, it is anticipated that the patient will be medically stable for discharge from the hospital within 2 midnights of admission.   Author: Rolla Plate, DO 01/11/2022 3:23 AM  For on call review [email or web address].

## 2022-01-11 NOTE — Assessment & Plan Note (Signed)
-   Intermittently for 12 years - This episode has been going on for 2 days - Continue Compazine as needed for nausea and vomiting in the setting of long QT - Continue to monitor

## 2022-01-11 NOTE — Assessment & Plan Note (Signed)
-   Potassium 2.2 - 40 mEq and 1 L normal saline infusion - Magnesium stable at 2.2 - Secondary to GI losses and poor p.o. intake - Continue to monitor

## 2022-01-11 NOTE — Assessment & Plan Note (Signed)
-   Blood pressure as high as 184/121 - Continue Norvasc - Holding hydrochlorothiazide - We will add as needed medication if needed

## 2022-01-11 NOTE — ED Notes (Signed)
ED Provider at bedside. 

## 2022-01-11 NOTE — Assessment & Plan Note (Signed)
-   Continue SSRI 

## 2022-01-11 NOTE — Progress Notes (Signed)
Patient admitted to the hospital earlier this morning by Dr. Clearence Ped  Patient seen and examined.  Overall she is feeling better today.  She has not had any further nausea or vomiting.  No abdominal pain.  Reports that her symptoms usually onset after she has to strain for a bowel movement which happened on Saturday.  Subsequently began having nausea, vomiting on Saturday most of Sunday.  This has been a recurrent episode over the past 10 years, usually occurs every couple months.  She does not have any abdominal pain at this time.  She is currently on IV fluids and electrolyte replacement.  Tolerating clear liquids, will advance to solid food.  She will hopefully discharge home in the next 24 hours if her chemistry panel is improved and she is tolerating solid food.  [employer]

## 2022-01-11 NOTE — Assessment & Plan Note (Signed)
-   QTc 505 - Most likely secondary to hypokalemia - Monitor on telemetry - Anticipate improvement with replacement of potassium

## 2022-01-11 NOTE — ED Triage Notes (Signed)
Pt arrived via POV c/o intractable vomiting. Pt reports this has been going on for 12 years. Pt concerned her K+ is low and she is damaging her kidneys again.

## 2022-01-11 NOTE — TOC Progression Note (Signed)
  Transition of Care [facility]) Screening Note   Patient Details  Name: Beverly Elliott Date of Birth: November 29, 1970   Transition of Care [facility]) CM/SW Contact:    Boneta Lucks, RN Phone Number: 01/11/2022, 11:11 AM    Transition of Care Department [facility]) has reviewed patient and no TOC needs have been identified at this time. We will continue to monitor patient advancement through interdisciplinary progression rounds. If new patient transition needs arise, please place a TOC consult.     Expected Discharge Plan: Home/Self Care Barriers to Discharge: Continued Medical Work up  Expected Discharge Plan and Services Expected Discharge Plan: Home/Self Care      Living arrangements for the past 2 months: [place]

## 2022-01-11 NOTE — Assessment & Plan Note (Signed)
-   Creatinine increased from 1.45>> 1.78 -Secondary to dehydration/GI losses - Hold nephrotoxic agents when possible including holding hydrochlorothiazide - Patient received 1 L bolus in the ED, with and another liter to follow with 40 mEq of potassium - Trend in the a.m.

## 2022-01-12 DIAGNOSIS — E785 Hyperlipidemia, unspecified: Secondary | ICD-10-CM | POA: Diagnosis present

## 2022-01-12 DIAGNOSIS — K589 Irritable bowel syndrome without diarrhea: Secondary | ICD-10-CM | POA: Diagnosis present

## 2022-01-12 DIAGNOSIS — D72829 Elevated white blood cell count, unspecified: Secondary | ICD-10-CM | POA: Diagnosis present

## 2022-01-12 DIAGNOSIS — Z6838 Body mass index (BMI) 38.0-38.9, adult: Secondary | ICD-10-CM | POA: Diagnosis not present

## 2022-01-12 DIAGNOSIS — E860 Dehydration: Secondary | ICD-10-CM | POA: Diagnosis present

## 2022-01-12 DIAGNOSIS — E1165 Type 2 diabetes mellitus with hyperglycemia: Secondary | ICD-10-CM | POA: Diagnosis present

## 2022-01-12 DIAGNOSIS — F411 Generalized anxiety disorder: Secondary | ICD-10-CM | POA: Diagnosis present

## 2022-01-12 DIAGNOSIS — Z833 Family history of diabetes mellitus: Secondary | ICD-10-CM | POA: Diagnosis not present

## 2022-01-12 DIAGNOSIS — K648 Other hemorrhoids: Secondary | ICD-10-CM | POA: Diagnosis present

## 2022-01-12 DIAGNOSIS — E861 Hypovolemia: Secondary | ICD-10-CM | POA: Diagnosis present

## 2022-01-12 DIAGNOSIS — N179 Acute kidney failure, unspecified: Secondary | ICD-10-CM | POA: Diagnosis present

## 2022-01-12 DIAGNOSIS — K5900 Constipation, unspecified: Secondary | ICD-10-CM | POA: Diagnosis present

## 2022-01-12 DIAGNOSIS — R1110 Vomiting, unspecified: Secondary | ICD-10-CM | POA: Diagnosis present

## 2022-01-12 DIAGNOSIS — I10 Essential (primary) hypertension: Secondary | ICD-10-CM

## 2022-01-12 DIAGNOSIS — Z28310 Unvaccinated for covid-19: Secondary | ICD-10-CM | POA: Diagnosis not present

## 2022-01-12 DIAGNOSIS — Z87891 Personal history of nicotine dependence: Secondary | ICD-10-CM | POA: Diagnosis not present

## 2022-01-12 DIAGNOSIS — J45909 Unspecified asthma, uncomplicated: Secondary | ICD-10-CM | POA: Diagnosis present

## 2022-01-12 DIAGNOSIS — R1115 Cyclical vomiting syndrome unrelated to migraine: Secondary | ICD-10-CM | POA: Diagnosis present

## 2022-01-12 DIAGNOSIS — E669 Obesity, unspecified: Secondary | ICD-10-CM | POA: Diagnosis present

## 2022-01-12 DIAGNOSIS — F410 Panic disorder [episodic paroxysmal anxiety] without agoraphobia: Secondary | ICD-10-CM

## 2022-01-12 DIAGNOSIS — Z8249 Family history of ischemic heart disease and other diseases of the circulatory system: Secondary | ICD-10-CM | POA: Diagnosis not present

## 2022-01-12 DIAGNOSIS — Z7984 Long term (current) use of oral hypoglycemic drugs: Secondary | ICD-10-CM | POA: Diagnosis not present

## 2022-01-12 DIAGNOSIS — E876 Hypokalemia: Secondary | ICD-10-CM | POA: Diagnosis present

## 2022-01-12 DIAGNOSIS — K219 Gastro-esophageal reflux disease without esophagitis: Secondary | ICD-10-CM | POA: Diagnosis present

## 2022-01-12 DIAGNOSIS — Z79899 Other long term (current) drug therapy: Secondary | ICD-10-CM | POA: Diagnosis not present

## 2022-01-12 DIAGNOSIS — E872 Acidosis, unspecified: Secondary | ICD-10-CM | POA: Diagnosis present

## 2022-01-12 LAB — GLUCOSE, CAPILLARY
Glucose-Capillary: 125 mg/dL — ABNORMAL HIGH (ref 70–99)
Glucose-Capillary: 121 mg/dL — ABNORMAL HIGH (ref 70–99)
Glucose-Capillary: 163 mg/dL — ABNORMAL HIGH (ref 70–99)
Glucose-Capillary: 119 mg/dL — ABNORMAL HIGH (ref 70–99)

## 2022-01-12 LAB — BASIC METABOLIC PANEL
Anion gap: 6 (ref 5–15)
BUN: 15 mg/dL (ref 6–20)
CO2: 26 mmol/L (ref 22–32)
Calcium: 7.8 mg/dL — ABNORMAL LOW (ref 8.9–10.3)
Chloride: 110 mmol/L (ref 98–111)
Creatinine, Ser: 0.99 mg/dL (ref 0.44–1.00)
GFR, Estimated: >60 mL/min (ref >60)
Glucose, Bld: 104 mg/dL — ABNORMAL HIGH (ref 70–99)
Potassium: 3.7 mmol/L (ref 3.5–5.1)
Sodium: 142 mmol/L (ref 135–145)

## 2022-01-12 LAB — CBC
HCT: 41.2 % (ref 36.0–46.0)
Hemoglobin: 13.5 g/dL (ref 12.0–15.0)
MCH: 31 pg (ref 26.0–34.0)
MCHC: 32.8 g/dL (ref 30.0–36.0)
MCV: 94.7 fL (ref 80.0–100.0)
Platelets: 261 10*3/uL (ref 150–400)
RBC: 4.35 MIL/uL (ref 3.87–5.11)
RDW: 13.8 % (ref 11.5–15.5)
WBC: 9.7 10*3/uL (ref 4.0–10.5)
nRBC: 0 % (ref 0.0–0.2)

## 2022-01-12 LAB — LACTIC ACID, PLASMA: Lactic Acid, Venous: 1.9 mmol/L (ref 0.5–1.9)

## 2022-01-12 MED ORDER — PANTOPRAZOLE SODIUM 40 MG IV SOLR
40.0000 mg | INTRAVENOUS | Status: DC
Start: 2022-01-12 — End: 2022-01-13
  Administered 2022-01-12: 40 mg via INTRAVENOUS
  Filled 2022-01-12: qty 10

## 2022-01-12 MED ORDER — LABETALOL HCL 5 MG/ML IV SOLN
5.0000 mg | Freq: Four times a day (QID) | INTRAVENOUS | Status: DC | PRN
  Administered 2022-01-12: 5 mg via INTRAVENOUS
  Filled 2022-01-12: qty 4

## 2022-01-12 MED ORDER — POLYETHYLENE GLYCOL 3350 17 G PO PACK
17.0000 g | PACK | Freq: Every day | ORAL | Status: DC
  Administered 2022-01-12: 17 g via ORAL
  Filled 2022-01-12: qty 1

## 2022-01-12 MED ORDER — LORAZEPAM 2 MG/ML IJ SOLN
1.0000 mg | INTRAMUSCULAR | Status: DC | PRN
Start: 2022-01-12 — End: 2022-01-13
  Administered 2022-01-12: 1 mg via INTRAVENOUS
  Filled 2022-01-12: qty 1

## 2022-01-12 NOTE — Plan of Care (Signed)
Pt is alert and oriented x 4. Up adlib. No prns this shift. Awaiting am labs to check potassium level. No N/v this shift. Vitals stable.  Problem: Education: Goal: Ability to describe self-care measures that may prevent or decrease complications (Diabetes Survival Skills Education) will improve Outcome: Progressing Goal: Individualized Educational Video(s) Outcome: Progressing   Problem: Coping: Goal: Ability to adjust to condition or change in health will improve Outcome: Progressing   Problem: Fluid Volume: Goal: Ability to maintain a balanced intake and output will improve Outcome: Progressing   Problem: Health Behavior/Discharge Planning: Goal: Ability to identify and utilize available resources and services will improve Outcome: Progressing Goal: Ability to manage health-related needs will improve Outcome: Progressing   Problem: Metabolic: Goal: Ability to maintain appropriate glucose levels will improve Outcome: Progressing   Problem: Nutritional: Goal: Maintenance of adequate nutrition will improve Outcome: Progressing Goal: Progress toward achieving an optimal weight will improve Outcome: Progressing   Problem: Skin Integrity: Goal: Risk for impaired skin integrity will decrease Outcome: Progressing   Problem: Tissue Perfusion: Goal: Adequacy of tissue perfusion will improve Outcome: Progressing   Problem: Education: Goal: Knowledge of General Education information will improve Description: Including pain rating scale, medication(s)/side effects and non-pharmacologic comfort measures Outcome: Progressing   Problem: Health Behavior/Discharge Planning: Goal: Ability to manage health-related needs will improve Outcome: Progressing   Problem: Clinical Measurements: Goal: Ability to maintain clinical measurements within normal limits will improve Outcome: Progressing Goal: Will remain free from infection Outcome: Progressing Goal: Diagnostic test results will  improve Outcome: Progressing Goal: Respiratory complications will improve Outcome: Progressing Goal: Cardiovascular complication will be avoided Outcome: Progressing   Problem: Activity: Goal: Risk for activity intolerance will decrease Outcome: Progressing   Problem: Nutrition: Goal: Adequate nutrition will be maintained Outcome: Progressing   Problem: Coping: Goal: Level of anxiety will decrease Outcome: Progressing   Problem: Elimination: Goal: Will not experience complications related to bowel motility Outcome: Progressing Goal: Will not experience complications related to urinary retention Outcome: Progressing   Problem: Pain Managment: Goal: General experience of comfort will improve Outcome: Progressing   Problem: Safety: Goal: Ability to remain free from injury will improve Outcome: Progressing   Problem: Skin Integrity: Goal: Risk for impaired skin integrity will decrease Outcome: Progressing   Problem: Education: Goal: Knowledge of General Education information will improve Description: Including pain rating scale, medication(s)/side effects and non-pharmacologic comfort measures Outcome: Progressing   Problem: Health Behavior/Discharge Planning: Goal: Ability to manage health-related needs will improve Outcome: Progressing   Problem: Clinical Measurements: Goal: Ability to maintain clinical measurements within normal limits will improve Outcome: Progressing Goal: Will remain free from infection Outcome: Progressing Goal: Diagnostic test results will improve Outcome: Progressing Goal: Respiratory complications will improve Outcome: Progressing Goal: Cardiovascular complication will be avoided Outcome: Progressing   Problem: Activity: Goal: Risk for activity intolerance will decrease Outcome: Progressing   Problem: Nutrition: Goal: Adequate nutrition will be maintained Outcome: Progressing   Problem: Coping: Goal: Level of anxiety will  decrease Outcome: Progressing   Problem: Elimination: Goal: Will not experience complications related to bowel motility Outcome: Progressing Goal: Will not experience complications related to urinary retention Outcome: Progressing   Problem: Pain Managment: Goal: General experience of comfort will improve Outcome: Progressing   Problem: Safety: Goal: Ability to remain free from injury will improve Outcome: Progressing   Problem: Skin Integrity: Goal: Risk for impaired skin integrity will decrease Outcome: Progressing

## 2022-01-12 NOTE — Progress Notes (Signed)
PROGRESS NOTE    Beverly Elliott  ELF:[id number] DOB: [birth year] DOA: 01/11/2022 PCP: [facility]    Brief Narrative:  51 year old female admitted to the hospital with persistent vomiting.  She had associated hypokalemia and acute kidney injury from persistent vomiting.  She was started on IV fluids and potassium replacement.  Her overall electrolytes and renal function have improved, but she is continued to have vomiting.  Reports that her episodes of vomiting are brought on when she has to strain to move her bowels.  Her symptoms may be related to cyclical vomiting syndrome.  GI consulted.   Assessment & Plan:   Principal Problem:   AKI (acute kidney injury) ([place]) Active Problems:   Intractable vomiting   Hypokalemia   GERD   Essential hypertension   Generalized anxiety disorder with panic attacks   Prolonged QT interval   Leukocytosis   Vomiting   Acute kidney injury -Admitted with creatinine of 1.7 -Secondary to hypovolemia from persistent vomiting -Improved with IV fluids to 0.9  Hypokalemia -Secondary to persistent vomiting -Potassium 2.2 on admission -Corrected with supplementation  Intractable vomiting -She says has been intermittently occurring for several years. -Abdomen appears to be benign on exam -Episodes are usually brought on when she has a straining for a bowel movement -She has been started on MiraLAX to help avoid straining -We will request GI input regarding any further work-up/intervention that may be helpful -We will keep on clear liquids for now -Continue as needed antiemetics  GAD -Continue SSRI  Hypertension -Continued on Norvasc  GERD -Continue on PPI  DVT prophylaxis: heparin injection 5,000 Units Start: 01/11/22 0600 SCDs Start: 01/11/22 0307  Code Status: Full code Family Communication: Discussed with patient Disposition Plan: Status is: Inpatient Remains inpatient appropriate because: Still unable  to tolerate p.o.     Consultants:    Procedures:    Antimicrobials:      Subjective: Patient strained to have a bowel movement this morning and subsequently began vomiting.  She had several episodes of vomiting since then has not been able to keep anything down.  Objective: Vitals:   01/11/22 1254 01/11/22 2039 01/12/22 0127 01/12/22 0500  BP: 126/80 110/77 113/89 115/79  Pulse: 80  81 61  Resp: '17 18 18 18  '$ Temp: 98.2 F (36.8 C) 98.5 F (36.9 C) 98.2 F (36.8 C) 98.3 F (36.8 C)  TempSrc:  Oral Oral Oral  SpO2: 99% 96% 98% 96%  Weight:      Height:        Intake/Output Summary (Last 24 hours) at 01/12/2022 1828 Last data filed at 01/12/2022 1300 Gross per 24 hour  Intake 630 ml  Output --  Net 630 ml   Filed Weights   01/11/22 0127  Weight: 95 kg    Examination:  General exam: Appears calm and comfortable  Respiratory system: Clear to auscultation. Respiratory effort normal. Cardiovascular system: S1 & S2 heard, RRR. No JVD, murmurs, rubs, gallops or clicks. No pedal edema. Gastrointestinal system: Abdomen is nondistended, soft and nontender. No organomegaly or masses felt. Normal bowel sounds heard. Central nervous system: Alert and oriented. No focal neurological deficits. Extremities: Symmetric 5 x 5 power. Skin: No rashes, lesions or ulcers Psychiatry: Judgement and insight appear normal. Mood & affect appropriate.     Data Reviewed: I have personally reviewed following labs and imaging studies  CBC: Recent Labs  Lab 01/11/22 0139 01/11/22 0326 01/12/22 1000  WBC 16.2* 16.2* 9.7  NEUTROABS 12.2* 12.5*  --  HGB 16.0* 15.4* 13.5  HCT 45.3 44.3 41.2  MCV 88.8 88.8 94.7  PLT 399 377 076   Basic Metabolic Panel: Recent Labs  Lab 01/11/22 0139 01/11/22 0326 01/12/22 0757  NA 137 139 142  K 2.2* 2.4* 3.7  CL 94* 97* 110  CO2 '23 25 26  '$ GLUCOSE 236* 155* 104*  BUN 27* 26* 15  CREATININE 1.78* 1.51* 0.99  CALCIUM 9.7 9.6 7.8*  MG 2.2  2.4  --    GFR: Estimated Creatinine Clearance: 73.9 mL/min (by C-G formula based on SCr of 0.99 mg/dL). Liver Function Tests: Recent Labs  Lab 01/11/22 0139 01/11/22 0326  AST 29 29  ALT 31 28  ALKPHOS 72 68  BILITOT 0.9 0.8  PROT 8.7* 8.4*  ALBUMIN 4.9 4.8   Recent Labs  Lab 01/11/22 0139  LIPASE 28   No results for input(s): "AMMONIA" in the last 168 hours. Coagulation Profile: No results for input(s): "INR", "PROTIME" in the last 168 hours. Cardiac Enzymes: No results for input(s): "CKTOTAL", "CKMB", "CKMBINDEX", "TROPONINI" in the last 168 hours. BNP (last 3 results) No results for input(s): "PROBNP" in the last 8760 hours. HbA1C: Recent Labs    01/11/22 0326  HGBA1C 5.8*   CBG: Recent Labs  Lab 01/11/22 1610 01/11/22 2149 01/12/22 0725 01/12/22 1104 01/12/22 1619  GLUCAP 107* 94 125* 121* 163*   Lipid Profile: No results for input(s): "CHOL", "HDL", "LDLCALC", "TRIG", "CHOLHDL", "LDLDIRECT" in the last 72 hours. Thyroid Function Tests: No results for input(s): "TSH", "T4TOTAL", "FREET4", "T3FREE", "THYROIDAB" in the last 72 hours. Anemia Panel: No results for input(s): "VITAMINB12", "FOLATE", "FERRITIN", "TIBC", "IRON", "RETICCTPCT" in the last 72 hours. Sepsis Labs: Recent Labs  Lab 01/11/22 0326 01/12/22 0757  LATICACIDVEN 2.4* 1.9    No results found for this or any previous visit (from the past 240 hour(s)).       Radiology Studies: No results found.      Scheduled Meds:  amLODipine  10 mg Oral QHS   aspirin EC  81 mg Oral Daily   buPROPion  300 mg Oral Daily   escitalopram  20 mg Oral QHS   heparin  5,000 Units Subcutaneous Q8H   insulin aspart  0-15 Units Subcutaneous TID WC   insulin aspart  0-5 Units Subcutaneous QHS   pantoprazole  40 mg Oral QHS   polyethylene glycol  17 g Oral Daily   pravastatin  40 mg Oral QHS   Continuous Infusions:  0.9 % NaCl with KCl 40 mEq / L 125 mL/hr at 01/12/22 0122     LOS: 0 days     Time spent: 56mns    JKathie Dike MD [facility]   If 7PM-7AM, please contact night-coverage [email or web address]  01/12/2022, 6:28 PM

## 2022-01-13 ENCOUNTER — Inpatient Hospital Stay (HOSPITAL_COMMUNITY): Payer: PRIVATE HEALTH INSURANCE

## 2022-01-13 LAB — CBC
HCT: 37.8 % (ref 36.0–46.0)
Hemoglobin: 12.5 g/dL (ref 12.0–15.0)
MCH: 31.1 pg (ref 26.0–34.0)
MCHC: 33.1 g/dL (ref 30.0–36.0)
MCV: 94 fL (ref 80.0–100.0)
Platelets: 271 10*3/uL (ref 150–400)
RBC: 4.02 MIL/uL (ref 3.87–5.11)
RDW: 13.5 % (ref 11.5–15.5)
WBC: 10.6 10*3/uL — ABNORMAL HIGH (ref 4.0–10.5)
nRBC: 0 % (ref 0.0–0.2)

## 2022-01-13 LAB — BASIC METABOLIC PANEL
Anion gap: 5 (ref 5–15)
BUN: 10 mg/dL (ref 6–20)
CO2: 24 mmol/L (ref 22–32)
Calcium: 7.9 mg/dL — ABNORMAL LOW (ref 8.9–10.3)
Chloride: 111 mmol/L (ref 98–111)
Creatinine, Ser: 0.88 mg/dL (ref 0.44–1.00)
GFR, Estimated: >60 mL/min (ref >60)
Glucose, Bld: 93 mg/dL (ref 70–99)
Potassium: 3.6 mmol/L (ref 3.5–5.1)
Sodium: 140 mmol/L (ref 135–145)

## 2022-01-13 LAB — GLUCOSE, CAPILLARY
Glucose-Capillary: 112 mg/dL — ABNORMAL HIGH (ref 70–99)
Glucose-Capillary: 112 mg/dL — ABNORMAL HIGH (ref 70–99)
Glucose-Capillary: 91 mg/dL (ref 70–99)

## 2022-01-13 MED ORDER — HYDROCORTISONE ACETATE 25 MG RE SUPP: 25.0000 mg | Freq: Two times a day (BID) | RECTAL | 1 refills | Status: DC

## 2022-01-13 MED ORDER — EQ ASPIRIN ADULT LOW DOSE 81 MG PO TBEC: 81.0000 mg | DELAYED_RELEASE_TABLET | Freq: Every day | ORAL | 1 refills | Status: DC

## 2022-01-13 MED ORDER — PROCHLORPERAZINE 25 MG RE SUPP
25.0000 mg | Freq: Once | RECTAL | Status: AC
Start: 2022-01-13 — End: 2022-01-13
  Administered 2022-01-13: 25 mg via RECTAL
  Filled 2022-01-13: qty 1

## 2022-01-13 MED ORDER — AMLODIPINE BESYLATE 10 MG PO TABS
10.0000 mg | ORAL_TABLET | Freq: Every day | ORAL | 3 refills | Status: DC
Start: 2022-01-13 — End: 2022-12-31

## 2022-01-13 MED ORDER — ACETAMINOPHEN 325 MG PO TABS: 650.0000 mg | ORAL_TABLET | Freq: Four times a day (QID) | ORAL | 0 refills | Status: DC | PRN

## 2022-01-13 MED ORDER — PANTOPRAZOLE SODIUM 40 MG PO TBEC: 40.0000 mg | DELAYED_RELEASE_TABLET | Freq: Every day | ORAL | 0 refills | Status: DC

## 2022-01-13 MED ORDER — PROCHLORPERAZINE 25 MG RE SUPP
25.0000 mg | Freq: Two times a day (BID) | RECTAL | 0 refills | Status: DC | PRN
Start: 2022-01-13 — End: 2022-07-20

## 2022-01-13 MED ORDER — HYDROCHLOROTHIAZIDE 25 MG PO TABS: 12.5000 mg | ORAL_TABLET | Freq: Every day | ORAL | 2 refills | Status: DC

## 2022-01-13 MED ORDER — FIBER ADULT GUMMIES 2 G PO CHEW: 2.0000 | CHEWABLE_TABLET | Freq: Three times a day (TID) | ORAL | 3 refills | Status: DC

## 2022-01-13 MED ORDER — POLYETHYLENE GLYCOL 3350 17 G PO PACK: 17.0000 g | PACK | Freq: Every day | ORAL | 3 refills | Status: DC | PRN

## 2022-01-13 MED ORDER — POLYETHYLENE GLYCOL 3350 17 G PO PACK
17.0000 g | PACK | Freq: Two times a day (BID) | ORAL | Status: DC
  Administered 2022-01-13: 17 g via ORAL
  Filled 2022-01-13: qty 1

## 2022-01-13 NOTE — Progress Notes (Signed)
Patient provided discharge information, patient verbalized understanding, IV removed, all belongings given to patient, patient in stable condition, accompanied by spouse to exit building.

## 2022-01-13 NOTE — Consult Note (Signed)
Referring Provider: No ref. provider found Primary Care Physician:  [facility] Primary Gastroenterologist: not previously established  Date of Admission: 01/11/22 Date of Consultation: 01/13/22  Reason for Consultation:  Emesis  HPI:  Beverly Elliott is a 51 y.o. year old female with medical history of anemia, asthma, GERD, IBS, depression and cyclic vomiting who presented to the ED on 9/4 with c/o nausea and vomiting that began on Saturday night. Has had ongoing cyclic vomiting intermittently for the past few years with upcoming GI evaluation.  ED Course: WBC 16.2, Hgb 16, K+ 2.2, BUN 27, Creatnine 1.78, Lipase 28, LA 2.4, UDS + cannabinoids  VSS  Consult: Patient states that she has seen GI in the past with a "million dollar workup" she states it was never discovered why she has episodes of nausea and vomiting. Symptoms have been ongoing since 2011. She notes that in march 2011 after a hysterectomy is when she began having these symptoms. She ended up admitted 2 weeks after her surgery for cyclic vomiting and ended up being sent home on IVF. She states that previously n/v episodes were occurring maybe once per year but recently she has had episodes every few months. Symptoms typically brought on by straining from trying to defecate. Symptoms can last from 12-24 hours on most occurrence though she has had a few episodes that last for multiple days. Recently she has had more episodes where she will have n/v for a day, feel fine the next day then resume n/v/ the next day. Used phenergan over the weekend x5 without resolution of her n/v.   She notes that she had a normal BM this morning without straining and felt much better. Last episode of vomiting was yesterday. She will occasionally take a swig of Mylanta at night to help with her constipation, she is also using grape juice at night to help with more regular BMs. She notes that stools do not necessarily feel hard  but that she just cannot get them to pass. Has even had times where she had near syncopal episodes from straining. She typically has a BM daily. She denies rectal bleeding, melena. No hematemesis or coffee ground emesis. Reports she has smoked MJ regularly since prior to 2011, previously smoked MJ 5x/day last use was in June, now uses some CBD/HEMP.  Previous work up, per chart review from 2012, includes:  EGD, MRI brain, GES, US abdomen, SBFT, multiple CT Abdomen/Pelvis. HIDA with an EF of 33%. Surgery was consulted and did not recommend cholecystectomy at that time.   Maintained on '40mg'$  protonix for severe acid reflux with history of dental caries due to acid. Symptoms are well controlled on this. Only NSAID use is occasional aleve.    She has had some weight loss after starting metformin in April, she has since lost approximately 20-25 pounds since then. Notably hgb a1c was 5.8 this admission.   She has seen ENT in the past with no findings to suggest etiology of her symptoms is related to ear/nose/throat, denies dizziness or any ear issues.   [facility] this [facility]/early October  Social: etoh very rarely, no tobacco, previously smoked MJ regularly, now using hemp/cbd  Last Colonoscopy:Oct 2019 with Dr. Luella Cook, small internal hemorrhoids, normal random biopsies, recall 10 years  Last EGD: 4-5 years ago maybe per patient at [facility], per chart review maybe 2012   Past Medical History:  Diagnosis Date   Anemia  Asthma    Depression    GERD (gastroesophageal reflux disease)    History of chicken pox    IBS (irritable bowel syndrome)    Kidney stones    Migraine    UTI (lower urinary tract infection)     Past Surgical History:  Procedure Laterality Date   ABDOMINAL HYSTERECTOMY     CESAREAN SECTION     TONSILLECTOMY      Prior to Admission medications   Medication Sig Start Date End Date Taking? Authorizing Provider  amLODipine  (NORVASC) 10 MG tablet Take 10 mg by mouth at bedtime. 12/29/20  Yes [provider]  buPROPion (WELLBUTRIN XL) 300 MG 24 hr tablet Take 300 mg by mouth daily. 09/07/21  Yes [provider]  EQ ASPIRIN ADULT LOW DOSE 81 MG EC tablet Take 81 mg by mouth daily. 04/15/21  Yes [provider]  escitalopram (LEXAPRO) 20 MG tablet Take 20 mg by mouth at bedtime. 05/08/21  Yes [provider]  hydrochlorothiazide (HYDRODIURIL) 25 MG tablet Take 25 mg by mouth daily. 06/10/21  Yes [provider]  metFORMIN (GLUCOPHAGE-XR) 500 MG 24 hr tablet Take 500 mg by mouth every morning. 11/13/21  Yes [provider]  pantoprazole (PROTONIX) 40 MG tablet Take 1 tablet (40 mg total) by mouth at bedtime. 05/28/21  Yes Mesner, Corene Cornea, MD  pravastatin (PRAVACHOL) 40 MG tablet Take 40 mg by mouth at bedtime. 05/10/21  Yes [provider]  dicyclomine (BENTYL) 20 MG tablet Take 20 mg by mouth daily as needed for spasms. Patient not taking: Reported on 01/11/2022 03/14/17   [provider]  Magnesium Oxide, Antacid, 400 MG TABS Take 0.5 tablets by mouth daily. Patient not taking: Reported on 01/11/2022 08/08/21   [provider]  promethazine (PHENERGAN) 25 MG suppository Place 25 mg rectally every 6 (six) hours as needed for nausea or vomiting. Patient not taking: Reported on 01/11/2022    [provider]    Current Facility-Administered Medications  Medication Dose Route Frequency Provider Last Rate Last Admin   0.9 % NaCl with KCl 40 mEq / L  infusion   Intravenous Continuous Kathie Dike, MD 125 mL/hr at 01/13/22 0454 New Bag at 01/13/22 0454   acetaminophen (TYLENOL) tablet 650 mg  650 mg Oral Q6H PRN Zierle-Ghosh, Asia B, DO       Or   acetaminophen (TYLENOL) suppository 650 mg  650 mg Rectal Q6H PRN Zierle-Ghosh, Asia B, DO       amLODipine (NORVASC) tablet 10 mg  10 mg Oral QHS Zierle-Ghosh, Asia B, DO   10 mg at 01/11/22 2154   aspirin EC  tablet 81 mg  81 mg Oral Daily Zierle-Ghosh, Asia B, DO   81 mg at 01/12/22 0825   buPROPion (WELLBUTRIN XL) 24 hr tablet 300 mg  300 mg Oral Daily Zierle-Ghosh, Asia B, DO   300 mg at 01/12/22 0824   dicyclomine (BENTYL) tablet 20 mg  20 mg Oral Daily PRN Zierle-Ghosh, Asia B, DO       escitalopram (LEXAPRO) tablet 20 mg  20 mg Oral QHS Zierle-Ghosh, Asia B, DO   20 mg at 01/11/22 2153   heparin injection 5,000 Units  5,000 Units Subcutaneous Q8H Zierle-Ghosh, Asia B, DO   5,000 Units at 01/13/22 0500   insulin aspart (novoLOG) injection 0-15 Units  0-15 Units Subcutaneous TID WC Zierle-Ghosh, Asia B, DO   3 Units at 01/12/22 1716   insulin aspart (novoLOG) injection 0-5 Units  0-5 Units Subcutaneous QHS Zierle-Ghosh, Asia B, DO       labetalol (NORMODYNE) injection 5 mg  5 mg Intravenous Q6H PRN Adefeso, Oladapo, DO   5 mg at 01/12/22 2106   LORazepam (ATIVAN) injection 1 mg  1 mg Intravenous Q4H PRN Kathie Dike, MD   1 mg at 01/12/22 1149   morphine (PF) 2 MG/ML injection 2 mg  2 mg Intravenous Q2H PRN Zierle-Ghosh, Asia B, DO       oxyCODONE (Oxy IR/ROXICODONE) immediate release tablet 5 mg  5 mg Oral Q4H PRN Zierle-Ghosh, Asia B, DO       pantoprazole (PROTONIX) injection 40 mg  40 mg Intravenous Q24H Adefeso, Oladapo, DO   40 mg at 01/12/22 2239   polyethylene glycol (MIRALAX / GLYCOLAX) packet 17 g  17 g Oral BID Emokpae, Courage, MD       pravastatin (PRAVACHOL) tablet 40 mg  40 mg Oral QHS Zierle-Ghosh, Asia B, DO   40 mg at 01/11/22 2154   prochlorperazine (COMPAZINE) injection 10 mg  10 mg Intravenous Q6H PRN Zierle-Ghosh, Asia B, DO   10 mg at 01/12/22 1430   prochlorperazine (COMPAZINE) suppository 25 mg  25 mg Rectal Once Roxan Hockey, MD        Allergies as of 01/11/2022 - Review Complete 01/11/2022  Allergen Reaction Noted   Latex Swelling 07/04/2019   Metoclopramide Nausea And Vomiting 12/17/2010   Metoclopramide hcl Nausea And Vomiting 12/17/2010   Tape Rash  10/04/2012    Family History  Problem Relation Age of Onset   Coronary artery disease Mother    Coronary artery disease Father    Heart attack Father    Hypertension Mother    Hypertension Paternal Grandmother    Diabetes Maternal Grandfather     Social History   Socioeconomic History   Marital status: Married    Spouse name: Not on file   Number of children: Not on file   Years of education: Not on file   Highest education level: Not on file  Occupational History   Not on file  Tobacco Use   Smoking status: Former    Packs/day: 0.80    Types: Cigarettes   Smokeless tobacco: Never  Vaping Use   Vaping Use: Never used  Substance and Sexual Activity   Alcohol use: Yes    Comment: rarely   Drug use: Yes    Types: Marijuana   Sexual activity: Yes  Other Topics Concern   Not on file  Social History Narrative   Has a special needs daughter - preemie, had stroke.   Social Determinants of Health   Financial Resource Strain: Not on file  Food Insecurity: Not on file  Transportation Needs: Not on file  Physical Activity: Not on file  Stress: Not on file  Social Connections: Not on file  Intimate Partner Violence: Not on file   Review of Systems: Gen: Denies fever, chills, loss of appetite, change in weight or weight loss CV: Denies chest pain, heart palpitations, syncope, edema  Resp: Denies shortness of breath with rest, cough, wheezing LK:GMWNUU melena, hematochezia, diarrhea, dysphagia, odyonophagia, early satiety or weight loss. +nausea +vomiting +constipation  GU : Denies urinary burning, urinary frequency, urinary incontinence.  MS: Denies joint pain,swelling, cramping Derm: Denies rash, itching, dry skin Psych: Denies depression, anxiety,confusion, or memory loss Heme: Denies bruising, bleeding, and enlarged lymph nodes.  Physical Exam: Vital signs in last 24 hours: Temp:  [98.4 F (36.9 C)-98.6 F (37 C)] 98.4  F (36.9 C) (09/06 0451) Pulse Rate:   [74-100] 74 (09/06 0451) Resp:  [18-20] 18 (09/06 0451) BP: (98-171)/(62-118) 127/89 (09/06 0451) SpO2:  [99 %-100 %] 100 % (09/06 0451) Last BM Date : 01/12/22 General:   Alert,  Well-developed, well-nourished, pleasant and cooperative in NAD Head:  Normocephalic and atraumatic. Eyes:  Sclera clear, no icterus.   Conjunctiva pink. Ears:  Normal auditory acuity. Nose:  No deformity, discharge,  or lesions. Mouth:  No deformity or lesions, dentition normal. Lungs:  Clear throughout to auscultation.   No wheezes, crackles, or rhonchi. No acute distress. Heart:  Regular rate and rhythm; no murmurs, clicks, rubs,  or gallops. Abdomen:  Soft, nontender and nondistended. No masses, hepatosplenomegaly or hernias noted. Normal bowel sounds, without guarding, and without rebound.   Rectal:  Deferred until time of colonoscopy.   Msk:  Symmetrical without gross deformities. Normal posture. Pulses:  Normal pulses noted. Extremities:  Without clubbing or edema. Neurologic:  Alert and  oriented x4;  grossly normal neurologically. Skin:  Intact without significant lesions or rashes. Psych:  Alert and cooperative. Normal mood and affect.  Intake/Output from previous day: 09/05 0701 - 09/06 0700 In: 720 [P.O.:720] Out: -  Intake/Output this shift: No intake/output data recorded.  Lab Results: Recent Labs    01/11/22 0326 01/12/22 1000 01/13/22 0420  WBC 16.2* 9.7 10.6*  HGB 15.4* 13.5 12.5  HCT 44.3 41.2 37.8  PLT 377 261 271   BMET Recent Labs    01/11/22 0326 01/12/22 0757 01/13/22 0420  NA 139 142 140  K 2.4* 3.7 3.6  CL 97* 110 111  CO2 '25 26 24  '$ GLUCOSE 155* 104* 93  BUN 26* 15 10  CREATININE 1.51* 0.99 0.88  CALCIUM 9.6 7.8* 7.9*   LFT Recent Labs    01/11/22 0139 01/11/22 0326  PROT 8.7* 8.4*  ALBUMIN 4.9 4.8  AST 29 29  ALT 31 28  ALKPHOS 72 68  BILITOT 0.9 0.8   Impression: Raziah Funnell is a 51 year old female with medical history of anemia, asthma,  GERD, IBS, depression and cyclic vomiting who presented to the ED on 9/4 with c/o nausea and vomiting that began on Saturday night. Has had ongoing cyclic vomiting intermittently for the past few years. GI consulted for further evaluation.  Nausea and Cyclic Vomiting:  chronic symptoms that occur intermittently since 2011, previously occurring maybe once per year but more recently every few months. Patient has had extensive workup by GI in the past without definitive etiology of her symptoms. Episodes of nausea and vomiting are typically brought on by straining to have a BM. She denies any specific food triggers. She is feeling better today with minimal nausa and no vomiting.  She has no hematemesis, rectal bleeding, weight loss.   She uses aleve on occasion. Abdominal pain only during these acute episodes. Last a1c 5.9. She has smoked marijuana since prior to these episodes, last smoked in June, now using CBD/Hemp, however, UDS + for cannabinoids, cannot completely rule out cannabinoid induced hyperemesis. Low suspicion for gastroparesis given infrequency of her symptoms.   Query some aspect of vasovagal response as cause of her symptoms given presentation. Would recommend bowel regimen to keep stools soft and avoid the need for straining as this seems to precipitate these episodes. She is having good results with miralax this admission, can continue with this.   As symptoms are improving and she has no red flags, would recommend further evaluation on outpatient  basis with her upcoming GI, however, will discuss further recommendations with Dr. Gala Romney.   Plan: Continue miralax BID, avoid constipation/straining Anti emetics per hospitalist Keep outpatient GI appt Avoid marijuana    LOS: 1 day    01/13/2022, 9:38 AM   Anton Cheramie L. Alver Sorrow, MSN, APRN, AGNP-C Adult-Gerontology Nurse Practitioner [facility]

## 2022-01-13 NOTE — Discharge Summary (Signed)
Beverly Elliott, is a 51 y.o. female  DOB [birth year]  MRN [id number].  Admission date:  01/11/2022  Admitting Physician  Kathie Dike, MD  Discharge Date:  01/13/2022   Primary MD  [place], [facility]  Recommendations for primary care physician for things to follow:   1)Avoid ibuprofen/Advil/Aleve/Motrin/Goody Powders/Naproxen/BC powders/Meloxicam/Diclofenac/Indomethacin and other Nonsteroidal anti-inflammatory medications as these will make you more likely to bleed and can cause stomach ulcers, can also cause Kidney problems.   2)Please [facility] with [facility] as advised  3)Your medications have been adjusted---   Admission Diagnosis  Hypokalemia [E87.6] AKI (acute kidney injury) ([place]) [R41.6] Cyclical vomiting syndrome not associated with migraine [R11.15] Vomiting [R11.10]   Discharge Diagnosis  Hypokalemia [E87.6] AKI (acute kidney injury) ([place]) [L84.5] Cyclical vomiting syndrome not associated with migraine [R11.15] Vomiting [R11.10]    Principal Problem:   AKI (acute kidney injury) ([place]) Active Problems:   Intractable vomiting   Hypokalemia   GERD   Essential hypertension   Generalized anxiety disorder with panic attacks   Prolonged QT interval   Leukocytosis   Vomiting      Past Medical History:  Diagnosis Date   Anemia    Asthma    Depression    GERD (gastroesophageal reflux disease)    History of chicken pox    IBS (irritable bowel syndrome)    Kidney stones    Migraine    UTI (lower urinary tract infection)     Past Surgical History:  Procedure Laterality Date   ABDOMINAL HYSTERECTOMY     CESAREAN SECTION     TONSILLECTOMY       HPI  from the history and physical done on the day of admission:     HPI: Beverly Elliott is a 51 y.o. female with medical history significant of EMEA, depression, GERD, and cyclic  vomiting presents to the ED with a chief complaint of vomiting.  Patient reports that started Saturday night.  She has had 20 episodes per day.  It is nonbloody.  Her last normal meal was Saturday at lunchtime.  Patient reports that the trigger for this episode seem to be cramping and straining with her last bowel movement which was on Saturday.  Patient reports that since this started she has not had any further bowel movements.  She has no abdominal pain just the feeling of nausea.  Patient reports no fevers, nothing abnormal in her diet recently, no travel recently.  She reports this feels like her normal cyclic vomiting.  She does have an appointment with GI soon.  Patient reports that she has been having muscle cramps in her sides, ribs, legs.  She denies chest pain.  She admits to palpitations but only when she is vomiting.  Patient has no other complaints.   Patient does not smoke, does not drink, does not use illicit drugs per her report.  She is not vaccinated for COVID.  Patient is full code. Review of Systems: As mentioned in the history of present illness. All  other systems reviewed and are negative.     Hospital Course:    Assessment and Plan: 1) intractable emesis--- history of cyclical vomiting for over 11 years -Extensive but negative GI work-up at [facility] including EGD and colonoscopies -Currently undergoing additional [facility] -THC might be contributory--patient said she was previously told by GI team at [facility] that she may have cannabinoid hyperemesis syndrome--- she disagrees with this possible etiology of her persistent emesis A UDS on 11/19/2021 and again on 01/11/2022 were both positive for THC -GI consult/input appreciated -Tolerating oral intake well , no further emesis -Discharge on PPI, as needed antiemetics -Patient plans to follow-up with [facility] team for further evaluation  2) internal hemorrhoids/possible constipation concerns--Anusol  HC , fiber supplements and laxatives as ordered  3) class II obesity- -Low calorie diet, portion control and increase physical activity discussed with patient -Body mass index is 38.31 kg/m.  4)AKI----this is due to intractable emesis and GI losses/dehydration compounded by HCTZ use - creatinine normalized with hydration - renally adjust medications, avoid nephrotoxic agents / dehydration  / hypotension  5)HTN--resume amlodipine and HCTZ  6)Hypokalemia--due to #1 above, compounded by HCTZ use replaced  7) anxiety disorder--- stable, continue Wellbutrin and Lexapro  8)HLD-continue pravastatin  9)DM2-A1c is 5.8, reflecting excellent diabetic control PTA -Continue metformin  Discharge Condition: stable  Follow UP--GI at [place] obtained -gi   Diet and Activity recommendation:  As advised  Discharge Instructions  * Discharge Instructions     Call MD for:  difficulty breathing, headache or visual disturbances   Complete by: As directed    Call MD for:  persistant dizziness or light-headedness   Complete by: As directed    Call MD for:  persistant nausea and vomiting   Complete by: As directed    Call MD for:  temperature >100.4   Complete by: As directed    Diet - low sodium heart healthy   Complete by: As directed    Discharge instructions   Complete by: As directed    1)Avoid ibuprofen/Advil/Aleve/Motrin/Goody Powders/Naproxen/BC powders/Meloxicam/Diclofenac/Indomethacin and other Nonsteroidal anti-inflammatory medications as these will make you more likely to bleed and can cause stomach ulcers, can also cause Kidney problems.   2)Please follow-up with [facility] as advised  3)Your medications have been adjusted---   Increase activity slowly   Complete by: As directed          Discharge Medications     Allergies as of 01/13/2022       Reactions   Latex Swelling   Metoclopramide Nausea And Vomiting    Vomited for 8 hours   Metoclopramide Hcl Nausea And Vomiting   Vomited for 8 hours   Tape Rash   Adhesive tape        Medication List     STOP taking these medications    dicyclomine 20 MG tablet Commonly known as: BENTYL   Magnesium Oxide (Antacid) 400 MG Tabs   promethazine 25 MG suppository Commonly known as: PHENERGAN       TAKE these medications    acetaminophen 325 MG tablet Commonly known as: TYLENOL Take 2 tablets (650 mg total) by mouth every 6 (six) hours as needed for mild pain or fever (or Fever >/= 101).   amLODipine 10 MG tablet Commonly known as: NORVASC Take 1 tablet (10 mg total) by mouth at bedtime.   buPROPion 300 MG 24 hr tablet Commonly known as: WELLBUTRIN  XL Take 300 mg by mouth daily.   EQ Aspirin Adult Low Dose 81 MG tablet Generic drug: aspirin EC Take 1 tablet (81 mg total) by mouth daily with breakfast. What changed: when to take this   escitalopram 20 MG tablet Commonly known as: LEXAPRO Take 20 mg by mouth at bedtime.   Fiber Adult Gummies 2 g Chew Chew 2 Pieces of gum by mouth 3 (three) times daily before meals.   hydrochlorothiazide 25 MG tablet Commonly known as: HYDRODIURIL Take 0.5 tablets (12.5 mg total) by mouth daily. What changed: how much to take   hydrocortisone 25 MG suppository Commonly known as: ANUSOL-HC Place 1 suppository (25 mg total) rectally every 12 (twelve) hours.   metFORMIN 500 MG 24 hr tablet Commonly known as: GLUCOPHAGE-XR Take 500 mg by mouth every morning.   pantoprazole 40 MG tablet Commonly known as: PROTONIX Take 1 tablet (40 mg total) by mouth at bedtime.   polyethylene glycol 17 g packet Commonly known as: MiraLax Take 17 g by mouth daily as needed.   pravastatin 40 MG tablet Commonly known as: PRAVACHOL Take 40 mg by mouth at bedtime.   prochlorperazine 25 MG suppository Commonly known as: COMPAZINE Place 1 suppository (25 mg total) rectally every 12 (twelve) hours as needed  for nausea or vomiting.        Major procedures and Radiology Reports - PLEASE review detailed and final reports for all details, in brief -    DG ABD ACUTE 2+V W 1V CHEST  Result Date: 01/13/2022 CLINICAL DATA:  Emesis EXAM: DG ABDOMEN ACUTE WITH 1 VIEW CHEST COMPARISON:  CT abdomen pelvis 07/14/2021 FINDINGS: There is no evidence of dilated bowel loops or free intraperitoneal air. No radiopaque calculi or other significant radiographic abnormality is seen. Heart size and mediastinal contours are within normal limits. Both lungs are clear. IMPRESSION: Negative abdominal radiographs.  No acute cardiopulmonary disease. Electronically Signed   By: San Morelle M.D.   On: 01/13/2022 10:46    Micro Results  Today   Subjective    Kathrynn Mackintosh today has no new complaints Tolerating oral intake well, no further emesis No fever  Or chills  -No chest pains and palpitations no dizziness          Patient has been seen and examined prior to discharge   Objective   Blood pressure (!) 143/81, pulse 78, temperature 98.2 F (36.8 C), temperature source Oral, resp. rate 16, height '5\' 2"'$  (1.575 m), weight 95 kg, SpO2 98 %.   Intake/Output Summary (Last 24 hours) at 01/13/2022 1725 Last data filed at 01/13/2022 1336 Gross per 24 hour  Intake 4025.19 ml  Output --  Net 4025.19 ml   Exam Gen:- Awake Alert, no acute distress  HEENT:- [place].AT, No sclera icterus Neck-Supple Neck,No JVD,.  Lungs-  CTAB , good air movement bilaterally CV- S1, S2 normal, regular Abd-  +ve B.Sounds, Abd Soft, No significant tenderness,    Extremity/Skin:- No  edema,   good pulses Psych-affect is appropriate, oriented x3 Neuro-no new focal deficits, no tremors    Data Review   CBC w Diff:  Lab Results  Component Value Date   WBC 10.6 (H) 01/13/2022   HGB 12.5 01/13/2022   HCT 37.8 01/13/2022   PLT 271 01/13/2022   LYMPHOPCT 15 01/11/2022   MONOPCT 7 01/11/2022   EOSPCT 0 01/11/2022   BASOPCT 0  01/11/2022    CMP:  Lab Results  Component Value Date   NA 140  01/13/2022   K 3.6 01/13/2022   CL 111 01/13/2022   CO2 24 01/13/2022   BUN 10 01/13/2022   CREATININE 0.88 01/13/2022   PROT 8.4 (H) 01/11/2022   ALBUMIN 4.8 01/11/2022   BILITOT 0.8 01/11/2022   ALKPHOS 68 01/11/2022   AST 29 01/11/2022   ALT 28 01/11/2022  .  Total Discharge time is about 33 minutes  Roxan Hockey M.D on 01/13/2022 at 5:25 PM  Go to [email or web address] -  for contact info  [facility] - Office  [phone]

## 2022-01-13 NOTE — Discharge Instructions (Signed)
1)Avoid ibuprofen/Advil/Aleve/Motrin/Goody Powders/Naproxen/BC powders/Meloxicam/Diclofenac/Indomethacin and other Nonsteroidal anti-inflammatory medications as these will make you more likely to bleed and can cause stomach ulcers, can also cause Kidney problems.   2)Please [facility] with [facility] as advised  3)Your medications have been adjusted---

## 2022-07-19 ENCOUNTER — Other Ambulatory Visit: Payer: Self-pay

## 2022-07-19 ENCOUNTER — Inpatient Hospital Stay (HOSPITAL_COMMUNITY)
Admission: EM | Admit: 2022-07-19 | Discharge: 2022-07-21 | DRG: 392 | Disposition: A | Discharge status: Home / Self Care | Payer: BC Managed Care – PPO | Attending: Internal Medicine | Admitting: Internal Medicine

## 2022-07-19 DIAGNOSIS — F1290 Cannabis use, unspecified, uncomplicated: Secondary | ICD-10-CM | POA: Diagnosis present

## 2022-07-19 DIAGNOSIS — F32A Depression, unspecified: Secondary | ICD-10-CM | POA: Diagnosis present

## 2022-07-19 DIAGNOSIS — E1165 Type 2 diabetes mellitus with hyperglycemia: Secondary | ICD-10-CM | POA: Diagnosis present

## 2022-07-19 DIAGNOSIS — Z6834 Body mass index (BMI) 34.0-34.9, adult: Secondary | ICD-10-CM

## 2022-07-19 DIAGNOSIS — Z91048 Other nonmedicinal substance allergy status: Secondary | ICD-10-CM

## 2022-07-19 DIAGNOSIS — Z87891 Personal history of nicotine dependence: Secondary | ICD-10-CM

## 2022-07-19 DIAGNOSIS — E876 Hypokalemia: Secondary | ICD-10-CM | POA: Diagnosis present

## 2022-07-19 DIAGNOSIS — J45909 Unspecified asthma, uncomplicated: Secondary | ICD-10-CM | POA: Diagnosis present

## 2022-07-19 DIAGNOSIS — N179 Acute kidney failure, unspecified: Secondary | ICD-10-CM | POA: Diagnosis present

## 2022-07-19 DIAGNOSIS — R112 Nausea with vomiting, unspecified: Principal | ICD-10-CM | POA: Diagnosis present

## 2022-07-19 DIAGNOSIS — E669 Obesity, unspecified: Secondary | ICD-10-CM | POA: Diagnosis present

## 2022-07-19 DIAGNOSIS — R1115 Cyclical vomiting syndrome unrelated to migraine: Secondary | ICD-10-CM | POA: Diagnosis not present

## 2022-07-19 DIAGNOSIS — Z7984 Long term (current) use of oral hypoglycemic drugs: Secondary | ICD-10-CM

## 2022-07-19 DIAGNOSIS — E782 Mixed hyperlipidemia: Secondary | ICD-10-CM | POA: Diagnosis present

## 2022-07-19 DIAGNOSIS — Z91040 Latex allergy status: Secondary | ICD-10-CM

## 2022-07-19 DIAGNOSIS — Z79899 Other long term (current) drug therapy: Secondary | ICD-10-CM

## 2022-07-19 DIAGNOSIS — Z888 Allergy status to other drugs, medicaments and biological substances status: Secondary | ICD-10-CM

## 2022-07-19 DIAGNOSIS — Z8249 Family history of ischemic heart disease and other diseases of the circulatory system: Secondary | ICD-10-CM

## 2022-07-19 DIAGNOSIS — Z833 Family history of diabetes mellitus: Secondary | ICD-10-CM

## 2022-07-19 DIAGNOSIS — I10 Essential (primary) hypertension: Secondary | ICD-10-CM | POA: Diagnosis present

## 2022-07-19 DIAGNOSIS — Z7982 Long term (current) use of aspirin: Secondary | ICD-10-CM

## 2022-07-19 DIAGNOSIS — R9431 Abnormal electrocardiogram [ECG] [EKG]: Secondary | ICD-10-CM | POA: Diagnosis present

## 2022-07-19 DIAGNOSIS — K219 Gastro-esophageal reflux disease without esophagitis: Secondary | ICD-10-CM | POA: Diagnosis present

## 2022-07-19 LAB — CBC
HCT: 45.7 % (ref 36.0–46.0)
Hemoglobin: 16 g/dL — ABNORMAL HIGH (ref 12.0–15.0)
MCH: 30.3 pg (ref 26.0–34.0)
MCHC: 35 g/dL (ref 30.0–36.0)
MCV: 86.6 fL (ref 80.0–100.0)
Platelets: 389 10*3/uL (ref 150–400)
RBC: 5.28 MIL/uL — ABNORMAL HIGH (ref 3.87–5.11)
RDW: 13.7 % (ref 11.5–15.5)
WBC: 10.8 10*3/uL — ABNORMAL HIGH (ref 4.0–10.5)
nRBC: 0 % (ref 0.0–0.2)

## 2022-07-19 LAB — LIPASE, BLOOD: Lipase: 34 U/L (ref 11–51)

## 2022-07-19 LAB — COMPREHENSIVE METABOLIC PANEL
ALT: 24 U/L (ref 0–44)
AST: 27 U/L (ref 15–41)
Albumin: 4.7 g/dL (ref 3.5–5.0)
Alkaline Phosphatase: 70 U/L (ref 38–126)
Anion gap: 20 — ABNORMAL HIGH (ref 5–15)
BUN: 28 mg/dL — ABNORMAL HIGH (ref 6–20)
CO2: 25 mmol/L (ref 22–32)
Calcium: 9.8 mg/dL (ref 8.9–10.3)
Chloride: 93 mmol/L — ABNORMAL LOW (ref 98–111)
Creatinine, Ser: 1.41 mg/dL — ABNORMAL HIGH (ref 0.44–1.00)
GFR, Estimated: 46 mL/min — ABNORMAL LOW (ref >60)
Glucose, Bld: 166 mg/dL — ABNORMAL HIGH (ref 70–99)
Potassium: 2.5 mmol/L — CL (ref 3.5–5.1)
Sodium: 138 mmol/L (ref 135–145)
Total Bilirubin: 0.9 mg/dL (ref 0.3–1.2)
Total Protein: 8.3 g/dL — ABNORMAL HIGH (ref 6.5–8.1)

## 2022-07-19 NOTE — ED Triage Notes (Signed)
Pt c/o N&V since Friday, states she is unable to keep anything down and has only been able to urinate once today.

## 2022-07-19 NOTE — ED Notes (Signed)
Pt unable to urinate at this time.  States she has only once today

## 2022-07-20 ENCOUNTER — Encounter (HOSPITAL_COMMUNITY): Payer: Self-pay | Admitting: Internal Medicine

## 2022-07-20 DIAGNOSIS — I10 Essential (primary) hypertension: Secondary | ICD-10-CM

## 2022-07-20 DIAGNOSIS — Z888 Allergy status to other drugs, medicaments and biological substances status: Secondary | ICD-10-CM | POA: Diagnosis not present

## 2022-07-20 DIAGNOSIS — Z7982 Long term (current) use of aspirin: Secondary | ICD-10-CM | POA: Diagnosis not present

## 2022-07-20 DIAGNOSIS — K219 Gastro-esophageal reflux disease without esophagitis: Secondary | ICD-10-CM | POA: Diagnosis not present

## 2022-07-20 DIAGNOSIS — E669 Obesity, unspecified: Secondary | ICD-10-CM | POA: Diagnosis not present

## 2022-07-20 DIAGNOSIS — F32A Depression, unspecified: Secondary | ICD-10-CM

## 2022-07-20 DIAGNOSIS — R9431 Abnormal electrocardiogram [ECG] [EKG]: Secondary | ICD-10-CM

## 2022-07-20 DIAGNOSIS — E876 Hypokalemia: Secondary | ICD-10-CM

## 2022-07-20 DIAGNOSIS — Z91048 Other nonmedicinal substance allergy status: Secondary | ICD-10-CM | POA: Diagnosis not present

## 2022-07-20 DIAGNOSIS — Z6834 Body mass index (BMI) 34.0-34.9, adult: Secondary | ICD-10-CM | POA: Diagnosis not present

## 2022-07-20 DIAGNOSIS — E1165 Type 2 diabetes mellitus with hyperglycemia: Secondary | ICD-10-CM | POA: Diagnosis not present

## 2022-07-20 DIAGNOSIS — F1290 Cannabis use, unspecified, uncomplicated: Secondary | ICD-10-CM | POA: Diagnosis present

## 2022-07-20 DIAGNOSIS — E782 Mixed hyperlipidemia: Secondary | ICD-10-CM

## 2022-07-20 DIAGNOSIS — N179 Acute kidney failure, unspecified: Secondary | ICD-10-CM

## 2022-07-20 DIAGNOSIS — Z8249 Family history of ischemic heart disease and other diseases of the circulatory system: Secondary | ICD-10-CM | POA: Diagnosis not present

## 2022-07-20 DIAGNOSIS — Z79899 Other long term (current) drug therapy: Secondary | ICD-10-CM | POA: Diagnosis not present

## 2022-07-20 DIAGNOSIS — R1115 Cyclical vomiting syndrome unrelated to migraine: Secondary | ICD-10-CM | POA: Diagnosis not present

## 2022-07-20 DIAGNOSIS — R112 Nausea with vomiting, unspecified: Secondary | ICD-10-CM | POA: Diagnosis not present

## 2022-07-20 DIAGNOSIS — J45909 Unspecified asthma, uncomplicated: Secondary | ICD-10-CM | POA: Diagnosis not present

## 2022-07-20 DIAGNOSIS — Z833 Family history of diabetes mellitus: Secondary | ICD-10-CM | POA: Diagnosis not present

## 2022-07-20 DIAGNOSIS — Z91040 Latex allergy status: Secondary | ICD-10-CM | POA: Diagnosis not present

## 2022-07-20 DIAGNOSIS — Z7984 Long term (current) use of oral hypoglycemic drugs: Secondary | ICD-10-CM | POA: Diagnosis not present

## 2022-07-20 DIAGNOSIS — Z87891 Personal history of nicotine dependence: Secondary | ICD-10-CM | POA: Diagnosis not present

## 2022-07-20 LAB — COMPREHENSIVE METABOLIC PANEL
ALT: 18 U/L (ref 0–44)
AST: 15 U/L (ref 15–41)
Albumin: 3.7 g/dL (ref 3.5–5.0)
Alkaline Phosphatase: 53 U/L (ref 38–126)
Anion gap: 11 (ref 5–15)
BUN: 25 mg/dL — ABNORMAL HIGH (ref 6–20)
CO2: 29 mmol/L (ref 22–32)
Calcium: 8.7 mg/dL — ABNORMAL LOW (ref 8.9–10.3)
Chloride: 97 mmol/L — ABNORMAL LOW (ref 98–111)
Creatinine, Ser: 1.4 mg/dL — ABNORMAL HIGH (ref 0.44–1.00)
GFR, Estimated: 46 mL/min — ABNORMAL LOW (ref >60)
Glucose, Bld: 106 mg/dL — ABNORMAL HIGH (ref 70–99)
Potassium: 2.7 mmol/L — CL (ref 3.5–5.1)
Sodium: 137 mmol/L (ref 135–145)
Total Bilirubin: 0.7 mg/dL (ref 0.3–1.2)
Total Protein: 6.5 g/dL (ref 6.5–8.1)

## 2022-07-20 LAB — RAPID URINE DRUG SCREEN, HOSP PERFORMED
Amphetamines: NOT DETECTED
Barbiturates: NOT DETECTED
Benzodiazepines: NOT DETECTED
Cocaine: NOT DETECTED
Opiates: NOT DETECTED
Tetrahydrocannabinol: POSITIVE — AB

## 2022-07-20 LAB — GLUCOSE, CAPILLARY
Glucose-Capillary: 110 mg/dL — ABNORMAL HIGH (ref 70–99)
Glucose-Capillary: 126 mg/dL — ABNORMAL HIGH (ref 70–99)
Glucose-Capillary: 96 mg/dL (ref 70–99)
Glucose-Capillary: 99 mg/dL (ref 70–99)

## 2022-07-20 LAB — CBC
HCT: 42.7 % (ref 36.0–46.0)
Hemoglobin: 15.1 g/dL — ABNORMAL HIGH (ref 12.0–15.0)
MCH: 31 pg (ref 26.0–34.0)
MCHC: 35.4 g/dL (ref 30.0–36.0)
MCV: 87.7 fL (ref 80.0–100.0)
Platelets: 338 10*3/uL (ref 150–400)
RBC: 4.87 MIL/uL (ref 3.87–5.11)
RDW: 13.8 % (ref 11.5–15.5)
WBC: 11.7 10*3/uL — ABNORMAL HIGH (ref 4.0–10.5)
nRBC: 0 % (ref 0.0–0.2)

## 2022-07-20 LAB — URINALYSIS, MICROSCOPIC (REFLEX)

## 2022-07-20 LAB — BASIC METABOLIC PANEL
Anion gap: 10 (ref 5–15)
BUN: 22 mg/dL — ABNORMAL HIGH (ref 6–20)
CO2: 28 mmol/L (ref 22–32)
Calcium: 8.5 mg/dL — ABNORMAL LOW (ref 8.9–10.3)
Chloride: 97 mmol/L — ABNORMAL LOW (ref 98–111)
Creatinine, Ser: 1.24 mg/dL — ABNORMAL HIGH (ref 0.44–1.00)
GFR, Estimated: 53 mL/min — ABNORMAL LOW (ref >60)
Glucose, Bld: 101 mg/dL — ABNORMAL HIGH (ref 70–99)
Potassium: 3 mmol/L — ABNORMAL LOW (ref 3.5–5.1)
Sodium: 135 mmol/L (ref 135–145)

## 2022-07-20 LAB — URINALYSIS, ROUTINE W REFLEX MICROSCOPIC
Bilirubin Urine: NEGATIVE
Glucose, UA: NEGATIVE mg/dL
Hgb urine dipstick: NEGATIVE
Ketones, ur: NEGATIVE mg/dL
Leukocytes,Ua: NEGATIVE
Nitrite: NEGATIVE
Protein, ur: 100 mg/dL — AB
Specific Gravity, Urine: >1.03 — ABNORMAL HIGH (ref 1.005–1.030)
pH: 6 (ref 5.0–8.0)

## 2022-07-20 LAB — CBG MONITORING, ED
Glucose-Capillary: 126 mg/dL — ABNORMAL HIGH (ref 70–99)
Glucose-Capillary: 108 mg/dL — ABNORMAL HIGH (ref 70–99)

## 2022-07-20 LAB — POTASSIUM: Potassium: 2.4 mmol/L — CL (ref 3.5–5.1)

## 2022-07-20 LAB — MAGNESIUM: Magnesium: 2 mg/dL (ref 1.7–2.4)

## 2022-07-20 MED ORDER — POTASSIUM CHLORIDE 2 MEQ/ML IV SOLN
INTRAVENOUS | Status: DC
  Filled 2022-07-20 (×4): qty 1000

## 2022-07-20 MED ORDER — PRAVASTATIN SODIUM 40 MG PO TABS
40.0000 mg | ORAL_TABLET | Freq: Every day | ORAL | Status: DC
  Administered 2022-07-20: 40 mg via ORAL
  Filled 2022-07-20: qty 1

## 2022-07-20 MED ORDER — POTASSIUM CHLORIDE 10 MEQ/100ML IV SOLN
10.0000 meq | INTRAVENOUS | Status: DC
  Administered 2022-07-20: 10 meq via INTRAVENOUS
  Filled 2022-07-20: qty 100

## 2022-07-20 MED ORDER — CAPSAICIN 0.025 % EX CREA
TOPICAL_CREAM | Freq: Two times a day (BID) | CUTANEOUS | Status: DC
  Filled 2022-07-20: qty 60

## 2022-07-20 MED ORDER — INSULIN ASPART 100 UNIT/ML IJ SOLN
0.0000 [IU] | INTRAMUSCULAR | Status: DC
  Administered 2022-07-20: 2 [IU] via SUBCUTANEOUS

## 2022-07-20 MED ORDER — POTASSIUM CHLORIDE 2 MEQ/ML IV SOLN
INTRAVENOUS | Status: DC
  Filled 2022-07-20 (×7): qty 1000

## 2022-07-20 MED ORDER — POTASSIUM CHLORIDE 20 MEQ PO PACK: 40.0000 meq | PACK | ORAL | Status: DC

## 2022-07-20 MED ORDER — BUPROPION HCL ER (XL) 150 MG PO TB24
300.0000 mg | ORAL_TABLET | Freq: Every day | ORAL | Status: DC
  Administered 2022-07-20 – 2022-07-21 (×2): 300 mg via ORAL
  Filled 2022-07-20 (×2): qty 2

## 2022-07-20 MED ORDER — ACETAMINOPHEN 325 MG PO TABS: 650.0000 mg | ORAL_TABLET | Freq: Four times a day (QID) | ORAL | Status: DC | PRN

## 2022-07-20 MED ORDER — ACETAMINOPHEN 650 MG RE SUPP: 650.0000 mg | Freq: Four times a day (QID) | RECTAL | Status: DC | PRN

## 2022-07-20 MED ORDER — ENOXAPARIN SODIUM 40 MG/0.4ML IJ SOSY
40.0000 mg | PREFILLED_SYRINGE | INTRAMUSCULAR | Status: DC
  Administered 2022-07-20: 40 mg via SUBCUTANEOUS
  Filled 2022-07-20: qty 0.4

## 2022-07-20 MED ORDER — PROMETHAZINE HCL 25 MG/ML IJ SOLN
INTRAMUSCULAR | Status: AC
  Filled 2022-07-20: qty 1

## 2022-07-20 MED ORDER — PANTOPRAZOLE SODIUM 40 MG IV SOLR
40.0000 mg | Freq: Once | INTRAVENOUS | Status: AC
  Administered 2022-07-20: 40 mg via INTRAVENOUS
  Filled 2022-07-20: qty 10

## 2022-07-20 MED ORDER — PANTOPRAZOLE SODIUM 40 MG IV SOLR
40.0000 mg | Freq: Two times a day (BID) | INTRAVENOUS | Status: DC
  Administered 2022-07-20 – 2022-07-21 (×2): 40 mg via INTRAVENOUS
  Filled 2022-07-20 (×2): qty 10

## 2022-07-20 MED ORDER — POTASSIUM CHLORIDE CRYS ER 20 MEQ PO TBCR: 40.0000 meq | EXTENDED_RELEASE_TABLET | ORAL | Status: DC

## 2022-07-20 MED ORDER — LACTATED RINGERS IV BOLUS
1000.0000 mL | Freq: Once | INTRAVENOUS | Status: AC
  Administered 2022-07-20: 1000 mL via INTRAVENOUS

## 2022-07-20 MED ORDER — POTASSIUM CHLORIDE 10 MEQ/100ML IV SOLN
10.0000 meq | INTRAVENOUS | Status: AC
  Administered 2022-07-20 (×3): 10 meq via INTRAVENOUS
  Filled 2022-07-20 (×3): qty 100

## 2022-07-20 MED ORDER — SODIUM CHLORIDE 0.9 % IV SOLN
25.0000 mg | Freq: Once | INTRAVENOUS | Status: DC
  Filled 2022-07-20: qty 1

## 2022-07-20 MED ORDER — AMLODIPINE BESYLATE 5 MG PO TABS
10.0000 mg | ORAL_TABLET | Freq: Every day | ORAL | Status: DC
  Administered 2022-07-20: 10 mg via ORAL
  Filled 2022-07-20: qty 2

## 2022-07-20 MED ORDER — PANTOPRAZOLE SODIUM 40 MG IV SOLR
40.0000 mg | INTRAVENOUS | Status: DC
  Administered 2022-07-20: 40 mg via INTRAVENOUS
  Filled 2022-07-20: qty 10

## 2022-07-20 MED ORDER — PROCHLORPERAZINE EDISYLATE 10 MG/2ML IJ SOLN: 10.0000 mg | Freq: Four times a day (QID) | INTRAMUSCULAR | Status: DC | PRN

## 2022-07-20 MED ORDER — POTASSIUM CHLORIDE 10 MEQ/100ML IV SOLN: 10.0000 meq | INTRAVENOUS | Status: DC

## 2022-07-20 MED ORDER — POTASSIUM CHLORIDE CRYS ER 20 MEQ PO TBCR
40.0000 meq | EXTENDED_RELEASE_TABLET | Freq: Once | ORAL | Status: AC
  Administered 2022-07-20: 40 meq via ORAL
  Filled 2022-07-20: qty 2

## 2022-07-20 NOTE — ED Notes (Signed)
Pt ambulated to restroom with steady gait. Pt states she is no longer nauseous.

## 2022-07-20 NOTE — ED Notes (Signed)
ED TO INPATIENT HANDOFF REPORT  ED Nurse Name and Phone #: [phone]  S Name/Age/Gender Beverly Elliott 52 y.o. female Room/Bed: APA12/APA12  Code Status   Code Status: Full Code  Home/SNF/Other Home Patient oriented to: self, place, time, and situation Is this baseline? Yes   Triage Complete: Triage complete  Chief Complaint Intractable nausea and vomiting [R11.2]  Triage Note Pt c/o N&V since Friday, states she is unable to keep anything down and has only been able to urinate once today.    Allergies Allergies  Allergen Reactions   Latex Swelling   Metoclopramide Nausea And Vomiting    Vomited for 8 hours    Metoclopramide Hcl Nausea And Vomiting    Vomited for 8 hours   Tape Rash    Adhesive tape    Level of Care/Admitting Diagnosis ED Disposition     ED Disposition  Admit   Condition  --   [place]: [facility] [id number]  Level of Care: Telemetry [5]  Covid Evaluation: Asymptomatic - no recent exposure (last 10 days) testing not required  Diagnosis: Intractable nausea and vomiting [id number]  Admitting Physician: Bernadette Hoit [id number]  Attending Physician: Bernadette Hoit [id number]  Certification:: I certify this patient will need inpatient services for at least 2 midnights  Estimated Length of Stay: 3          B Medical/Surgery History Past Medical History:  Diagnosis Date   Anemia    Asthma    Depression    GERD (gastroesophageal reflux disease)    History of chicken pox    IBS (irritable bowel syndrome)    Kidney stones    Migraine    UTI (lower urinary tract infection)    Past Surgical History:  Procedure Laterality Date   ABDOMINAL HYSTERECTOMY     CESAREAN SECTION     TONSILLECTOMY       A IV Location/Drains/Wounds Patient Lines/Drains/Airways Status     Active Line/Drains/Airways     Name Placement date Placement time Site Days   Peripheral IV 07/19/22 22 G 1" Posterior;Right Hand 07/19/22  2242   Hand  1   Peripheral IV 07/20/22 18 G 1.88" Anterior;Left Forearm 07/20/22  0102  Forearm  less than 1            Intake/Output Last 24 hours No intake or output data in the 24 hours ending 07/20/22 [id number]  Labs/Imaging Results for orders placed or performed during the hospital encounter of 07/19/22 (from the past 48 hour(s))  Lipase, blood     Status: None   Collection Time: 07/19/22  8:20 PM  Result Value Ref Range   Lipase 34 11 - 51 U/L    Comment: Performed at [facility], [address], [place],  [address]  Comprehensive metabolic panel     Status: Abnormal   Collection Time: 07/19/22  8:20 PM  Result Value Ref Range   Sodium 138 135 - 145 mmol/L   Potassium 2.5 (LL) 3.5 - 5.1 mmol/L    Comment: CRITICAL RESULT CALLED TO, READ BACK BY AND VERIFIED WITH M. MOSTELLER AT 2157 ON 03.11.24 BY ADGER J   Chloride 93 (L) 98 - 111 mmol/L   CO2 25 22 - 32 mmol/L   Glucose, Bld 166 (H) 70 - 99 mg/dL    Comment: Glucose reference range applies only to samples taken after fasting for at least 8 hours.   BUN 28 (H) 6 - 20 mg/dL   Creatinine, Ser 1.41 (H)  0.44 - 1.00 mg/dL   Calcium 9.8 8.9 - 10.3 mg/dL   Total Protein 8.3 (H) 6.5 - 8.1 g/dL   Albumin 4.7 3.5 - 5.0 g/dL   AST 27 15 - 41 U/L   ALT 24 0 - 44 U/L   Alkaline Phosphatase 70 38 - 126 U/L   Total Bilirubin 0.9 0.3 - 1.2 mg/dL   GFR, Estimated 46 (L) >60 mL/min    Comment: (NOTE) Calculated using the CKD-EPI Creatinine Equation (2021)    Anion gap 20 (H) 5 - 15    Comment: Performed at [facility], [address] [place], [place] [address]  CBC     Status: Abnormal   Collection Time: 07/19/22  8:20 PM  Result Value Ref Range   WBC 10.8 (H) 4.0 - 10.5 K/uL   RBC 5.28 (H) 3.87 - 5.11 MIL/uL   Hemoglobin 16.0 (H) 12.0 - 15.0 g/dL   HCT 45.7 36.0 - 46.0 %   MCV 86.6 80.0 - 100.0 fL   MCH 30.3 26.0 - 34.0 pg   MCHC 35.0 30.0 - 36.0 g/dL   RDW 13.7 11.5 - 15.5 %   Platelets 389 150 - 400 K/uL   nRBC 0.0 0.0 -  0.2 %    Comment: Performed at [facility], [address] [place], [place] [address]  Magnesium     Status: None   Collection Time: 07/19/22  8:20 PM  Result Value Ref Range   Magnesium 2.0 1.7 - 2.4 mg/dL    Comment: Performed at [facility], [address] [place], [place] [address]  Potassium     Status: Abnormal   Collection Time: 07/20/22  2:24 AM  Result Value Ref Range   Potassium 2.4 (LL) 3.5 - 5.1 mmol/L    Comment: CRITICAL RESULT CALLED TO, READ BACK BY AND VERIFIED WITH MOSTELLER,M  '@0304'$  BY MATTHEWS, B 3.12.2024 Performed at [facility], [address] [place], [place] [address]   CBC     Status: Abnormal   Collection Time: 07/20/22  2:24 AM  Result Value Ref Range   WBC 11.7 (H) 4.0 - 10.5 K/uL   RBC 4.87 3.87 - 5.11 MIL/uL   Hemoglobin 15.1 (H) 12.0 - 15.0 g/dL   HCT 42.7 36.0 - 46.0 %   MCV 87.7 80.0 - 100.0 fL   MCH 31.0 26.0 - 34.0 pg   MCHC 35.4 30.0 - 36.0 g/dL   RDW 13.8 11.5 - 15.5 %   Platelets 338 150 - 400 K/uL   nRBC 0.0 0.0 - 0.2 %    Comment: Performed at [facility], [address], [place], [place] [address]  Urinalysis, Routine w reflex microscopic -Urine, Clean Catch     Status: Abnormal   Collection Time: 07/20/22  3:14 AM  Result Value Ref Range   Color, Urine YELLOW YELLOW   APPearance CLEAR CLEAR   Specific Gravity, Urine >1.030 (H) 1.005 - 1.030   pH 6.0 5.0 - 8.0   Glucose, UA NEGATIVE NEGATIVE mg/dL   Hgb urine dipstick NEGATIVE NEGATIVE   Bilirubin Urine NEGATIVE NEGATIVE   Ketones, ur NEGATIVE NEGATIVE mg/dL   Protein, ur 100 (A) NEGATIVE mg/dL   Nitrite NEGATIVE NEGATIVE   Leukocytes,Ua NEGATIVE NEGATIVE    Comment: Performed at [facility], [address], [place], [place] [address]  Urinalysis, Microscopic (reflex)     Status: Abnormal   Collection Time: 07/20/22  3:14 AM  Result Value Ref Range   RBC / HPF 6-10 0 -  5 RBC/hpf   WBC, UA 11-20 0 - 5 WBC/hpf   Bacteria, UA RARE (A) NONE SEEN   Squamous Epithelial / HPF 6-10  0 - 5 /HPF   Mucus PRESENT    Hyaline Casts, UA PRESENT     Comment: Performed at [facility], [address], [place], [place] [address]  Rapid urine drug screen (hospital performed)     Status: Abnormal   Collection Time: 07/20/22  3:14 AM  Result Value Ref Range   Opiates NONE DETECTED NONE DETECTED   Cocaine NONE DETECTED NONE DETECTED   Benzodiazepines NONE DETECTED NONE DETECTED   Amphetamines NONE DETECTED NONE DETECTED   Tetrahydrocannabinol POSITIVE (A) NONE DETECTED   Barbiturates NONE DETECTED NONE DETECTED    Comment: (NOTE) DRUG SCREEN FOR MEDICAL PURPOSES ONLY.  IF CONFIRMATION IS NEEDED FOR ANY PURPOSE, NOTIFY LAB WITHIN 5 DAYS.  LOWEST DETECTABLE LIMITS FOR URINE DRUG SCREEN Drug Class                     Cutoff (ng/mL) Amphetamine and metabolites    1000 Barbiturate and metabolites    200 Benzodiazepine                 200 Opiates and metabolites        300 Cocaine and metabolites        300 THC                            50 Performed at [facility], [address] [place], [place] [address]   Comprehensive metabolic panel     Status: Abnormal   Collection Time: 07/20/22  5:40 AM  Result Value Ref Range   Sodium 137 135 - 145 mmol/L   Potassium 2.7 (LL) 3.5 - 5.1 mmol/L    Comment: CRITICAL RESULT CALLED TO, READ BACK BY AND VERIFIED WITH MOTTSELER,M AT 6:30AM ON 07/20/22 BY FESTERMAN,C   Chloride 97 (L) 98 - 111 mmol/L   CO2 29 22 - 32 mmol/L   Glucose, Bld 106 (H) 70 - 99 mg/dL    Comment: Glucose reference range applies only to samples taken after fasting for at least 8 hours.   BUN 25 (H) 6 - 20 mg/dL   Creatinine, Ser 1.40 (H) 0.44 - 1.00 mg/dL   Calcium 8.7 (L) 8.9 - 10.3 mg/dL   Total Protein 6.5 6.5 - 8.1 g/dL   Albumin 3.7 3.5 - 5.0 g/dL   AST 15 15 - 41 U/L   ALT 18 0 - 44 U/L   Alkaline Phosphatase 53 38 - 126 U/L   Total Bilirubin 0.7 0.3 - 1.2 mg/dL   GFR, Estimated 46 (L) >60 mL/min    Comment: (NOTE) Calculated using the CKD-EPI  Creatinine Equation (2021)    Anion gap 11 5 - 15    Comment: Performed at [facility], [address] [place], [place] [address]   No results found.  Pending Labs Unresulted Labs (From admission, onward)     Start     Ordered   07/27/22 0500  Creatinine, serum  (enoxaparin (LOVENOX)    CrCl >/= 30 ml/min)  Weekly,   R     Comments: while on enoxaparin therapy    07/20/22 0620   07/21/22 [id number]  Basic metabolic panel  Tomorrow morning,   R        07/20/22 0620   07/20/22 0622  Hemoglobin A1c  Once,  R       Comments: To assess prior glycemic control    07/20/22 0621            Vitals/Pain Today's Vitals   07/20/22 0321 07/20/22 0330 07/20/22 0525 07/20/22 0631  BP:  129/85 (!) 127/90   Pulse:   78   Resp:  17 20   Temp: 98.2 F (36.8 C)     TempSrc:      SpO2:   97%   Weight:      Height:      PainSc:   Asleep Asleep    Isolation Precautions No active isolations  Medications Medications  promethazine (PHENERGAN) 25 mg in sodium chloride 0.9 % 50 mL IVPB (0 mg Intravenous Hold 07/20/22 0118)  enoxaparin (LOVENOX) injection 40 mg (has no administration in time range)  acetaminophen (TYLENOL) tablet 650 mg (has no administration in time range)    Or  acetaminophen (TYLENOL) suppository 650 mg (has no administration in time range)  prochlorperazine (COMPAZINE) injection 10 mg (has no administration in time range)  insulin aspart (novoLOG) injection 0-15 Units (has no administration in time range)  potassium chloride 10 mEq in 100 mL IVPB (0 mEq Intravenous Hold 07/20/22 0717)  potassium chloride 10 mEq in 100 mL IVPB (0 mEq Intravenous Stopped 07/20/22 0518)  lactated ringers bolus 1,000 mL (0 mLs Intravenous Stopped 07/20/22 0443)  lactated ringers bolus 1,000 mL (0 mLs Intravenous Stopped 07/20/22 0221)  pantoprazole (PROTONIX) injection 40 mg (40 mg Intravenous Given 07/20/22 0105)    Mobility walks     Focused Assessments N/v AKI x 2-3 days  last K 2.7  Dr  Florene Glen to change KCL runs    R Recommendations: See Admitting Provider Note  Report given to:   Additional Notes: [id number]

## 2022-07-20 NOTE — ED Provider Notes (Addendum)
[facility] Provider Note   CSN: [id number] Arrival date & time: 07/19/22  1956     History  Chief Complaint  Patient presents with   Emesis    Beverly Elliott is a 52 y.o. female.  The history is provided by the patient.  Emesis She has history of irritable bowel syndrome, GERD, asthma and comes in with uncontrolled vomiting for the last 4 days.  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  She does admit to cannabis use but states that she also stopped using cannabis for urine continue to have episodes of uncontrolled vomiting.  She has taken ondansetron without any benefit.  She denies abdominal pain or fever.  Of note, she did have laboratory workup done 4 days ago at which time her potassium was normal.  In the past, she has required hospitalization for management of her emesis.  She is also complaining of increased heartburn as she has not been able to take her pantoprazole for the last 4 days.   Home Medications Prior to Admission medications   Medication Sig Start Date End Date Taking? Authorizing Provider  acetaminophen (TYLENOL) 325 MG tablet Take 2 tablets (650 mg total) by mouth every 6 (six) hours as needed for mild pain or fever (or Fever >/= 101). 01/13/22   Emokpae, Courage, MD  amLODipine (NORVASC) 10 MG tablet Take 1 tablet (10 mg total) by mouth at bedtime. 01/13/22   Roxan Hockey, MD  buPROPion (WELLBUTRIN XL) 300 MG 24 hr tablet Take 300 mg by mouth daily. 09/07/21   [provider]  EQ ASPIRIN ADULT LOW DOSE 81 MG tablet Take 1 tablet (81 mg total) by mouth daily with breakfast. 01/13/22   Emokpae, Courage, MD  escitalopram (LEXAPRO) 20 MG tablet Take 20 mg by mouth at bedtime. 05/08/21   [provider]  Fiber Adult Gummies 2 g CHEW Chew 2 Pieces of gum by mouth 3 (three) times daily before meals. 01/13/22   Roxan Hockey, MD   hydrochlorothiazide (HYDRODIURIL) 25 MG tablet Take 0.5 tablets (12.5 mg total) by mouth daily. 01/13/22   Roxan Hockey, MD  hydrocortisone (ANUSOL-HC) 25 MG suppository Place 1 suppository (25 mg total) rectally every 12 (twelve) hours. 01/13/22 01/13/23  Roxan Hockey, MD  metFORMIN (GLUCOPHAGE-XR) 500 MG 24 hr tablet Take 500 mg by mouth every morning. 11/13/21   [provider]  pantoprazole (PROTONIX) 40 MG tablet Take 1 tablet (40 mg total) by mouth at bedtime. 01/13/22   Roxan Hockey, MD  polyethylene glycol (MIRALAX) 17 g packet Take 17 g by mouth daily as needed. 01/13/22   Roxan Hockey, MD  pravastatin (PRAVACHOL) 40 MG tablet Take 40 mg by mouth at bedtime. 05/10/21   [provider]  prochlorperazine (COMPAZINE) 25 MG suppository Place 1 suppository (25 mg total) rectally every 12 (twelve) hours as needed for nausea or vomiting. 01/13/22   Roxan Hockey, MD      Allergies    Latex, Metoclopramide, Metoclopramide hcl, and Tape    Review of Systems   Review of Systems  Gastrointestinal:  Positive for vomiting.  All other systems reviewed and are negative.   Physical Exam Updated Vital Signs BP (!) 140/101   Pulse 94   Temp 98.9 F (37.2 C) (Oral)   Resp 13   Ht '5\' 2"'$  (1.575 m)   Wt 85.3 kg   SpO2 99%   BMI 34.39 kg/m  Physical Exam Vitals and nursing note reviewed.   52 year old female, resting comfortably and in no acute distress. Vital signs are significant for mildly elevated blood pressure. Oxygen saturation is 99%, which is normal. Head is normocephalic and atraumatic. PERRLA, EOMI. Oropharynx is clear. Neck is nontender and supple without adenopathy or JVD. Back is nontender and there is no CVA tenderness. Lungs are clear without rales, wheezes, or rhonchi. Chest is nontender. Heart has regular rate and rhythm without murmur. Abdomen is soft, flat, nontender.  Peristalsis is hypoactive. Extremities have no cyanosis or edema, full range of  motion is present. Skin is warm and dry without rash. Neurologic: Mental status is normal, cranial nerves are intact, moves all extremities equally.  ED Results / Procedures / Treatments   Labs (all labs ordered are listed, but only abnormal results are displayed) Labs Reviewed  COMPREHENSIVE METABOLIC PANEL - Abnormal; Notable for the following components:      Result Value   Potassium 2.5 (*)    Chloride 93 (*)    Glucose, Bld 166 (*)    BUN 28 (*)    Creatinine, Ser 1.41 (*)    Total Protein 8.3 (*)    GFR, Estimated 46 (*)    Anion gap 20 (*)    All other components within normal limits  CBC - Abnormal; Notable for the following components:   WBC 10.8 (*)    RBC 5.28 (*)    Hemoglobin 16.0 (*)    All other components within normal limits  LIPASE, BLOOD  URINALYSIS, ROUTINE W REFLEX MICROSCOPIC  MAGNESIUM  POC URINE PREG, ED    EKG EKG Interpretation  Date/Time:  Monday July 19 2022 22:16:41 EDT Ventricular Rate:  112 PR Interval:  129 QRS Duration: 87 QT Interval:  366 QTC Calculation: 500 R Axis:   83 Text Interpretation: Sinus tachycardia Right atrial enlargement Repol abnrm suggests ischemia, diffuse leads Baseline wander in lead(s) III V1 When compared with ECG of 01/11/2022, No significant change was found Confirmed by Delora Fuel ([id number]) on 07/20/2022 1:01:17 AM  Procedures Procedures  Cardiac monitor shows normal sinus rhythm, per my interpretation.  Medications Ordered in ED Medications  potassium chloride 10 mEq in 100 mL IVPB (has no administration in time range)  promethazine (PHENERGAN) 25 mg in sodium chloride 0.9 % 50 mL IVPB (has no administration in time range)  lactated ringers bolus 1,000 mL (has no administration in time range)  lactated ringers bolus 1,000 mL (has no administration in time range)  pantoprazole (PROTONIX) injection 40 mg (has no administration in time range)    ED Course/ Medical Decision Making/ A&P                              Medical Decision Making Amount and/or Complexity of Data Reviewed Labs: ordered.  Risk Prescription drug management. Decision regarding hospitalization.   Nausea and vomiting which is not responding to antiemetics as an outpatient.  Differential diagnosis includes, but is not limited to, bowel obstruction, cyclic vomiting syndrome, cannabis hyperemesis syndrome.  I have reviewed and interpreted her laboratory tests, and my interpretation is mild leukocytosis which is nonspecific, severe hypokalemia, mild to moderate renal insufficiency.  On care everywhere, it is noted that she had potassium 4.0 and creatinine 1.1 on 23/11/2022.  Creatinine today is 1.41 which is a significant worsening of renal function and only 3 days.  I have ordered a magnesium level and I  have ordered IV fluids as well as IV potassium.  Given her rapid decline in renal function, I feel she needs to be admitted for aggressive IV hydration as well as potassium replacement and possible magnesium replacement.  I have ordered an ECG to look for evidence of QT prolongation.  Have ordered promethazine for nausea.  I have ordered intravenous pantoprazole.  I have reviewed her past records, and she had been admitted on 01/11/2022 for intractable vomiting, also on 11/19/2021 and 07/04/2019.  I have discussed the case with Dr. Josephine Cables of [facility] hospitalist, who agrees to admit the patient.  I have reviewed and interpreted her electrocardiogram and my interpretation is sinus tachycardia with repolarization abnormalities which are unchanged from prior, borderline prolonged QT interval.  CRITICAL CARE Performed by: Delora Fuel Total critical care time: 40 minutes Critical care time was exclusive of separately billable procedures and treating other patients. Critical care was necessary to treat or prevent imminent or life-threatening deterioration. Critical care was time spent personally by me on the following activities: development of  treatment plan with patient and/or surrogate as well as nursing, discussions with consultants, evaluation of patient's response to treatment, examination of patient, obtaining history from patient or surrogate, ordering and performing treatments and interventions, ordering and review of laboratory studies, ordering and review of radiographic studies, pulse oximetry and re-evaluation of patient's condition.  Final Clinical Impression(s) / ED Diagnoses Final diagnoses:  Intractable nausea and vomiting  Acute kidney injury (nontraumatic) (HCC)  Hypokalemia due to excessive gastrointestinal loss of potassium    Rx / DC Orders ED Discharge Orders     None         Delora Fuel, MD [id number]    Delora Fuel, MD [id number] [phone]

## 2022-07-20 NOTE — H&P (Signed)
History and Physical    Patient: Beverly Elliott [id number] DOB: 11/04/1970 DOA: 07/19/2022 DOS: the patient was seen and examined on 07/20/2022 PCP: [place] [place] [facility]  Patient coming from: Home  Chief Complaint:  Chief Complaint  Patient presents with   Emesis   HPI: Beverly Elliott is a 52 y.o. female with medical history significant of hypertension, type 2 diabetes mellitus, GERD, hyperlipidemia, depression who presents to the emergency department accompanied by husband due to 4-day onset of uncontrolled vomiting.  She complained of history of cyclic vomiting which has been linked to cannabis hyperemesis syndrome in the past, but patient states that she did not use THC in about a year, but continues to have episodes of uncontrolled vomiting.  She had no improvement of symptoms with Zofran and complains of heartburn, but she denies abdominal pain or fever.  Patient states that she has not been able to take her Protonix in 4 days due to persistent vomiting.  ED Course:  In the emergency department, pulse on arrival to the ED was 106 bpm, BP was 132/91, but other vital signs are within normal range.  Workup in the ED showed WBC 10.8, hemoglobin 16.0, hematocrit 45.7, MCV 86.6, platelets 389.  BMP showed sodium of 138, potassium 2.5, chloride 93, bicarb 25, glucose 166, BUN/creatinine 20/1.41 (baseline creatinine at 0.9-1.0).  Magnesium 2.0, lipase 34, urinalysis was unimpressive for UTI. She was treated with Protonix, potassium was replenished, IV hydration was provided and Phenergan was ordered to be given in the ED. Hospitalist was asked to admit patient for further evaluation and management.  Review of Systems: Review of systems as noted in the HPI. All other systems reviewed and are negative.   Past Medical History:  Diagnosis Date   Anemia    Asthma    Depression    GERD (gastroesophageal reflux disease)    History of chicken pox    IBS (irritable  bowel syndrome)    Kidney stones    Migraine    UTI (lower urinary tract infection)    Past Surgical History:  Procedure Laterality Date   ABDOMINAL HYSTERECTOMY     CESAREAN SECTION     TONSILLECTOMY      Social History:  reports that she has quit smoking. Her smoking use included cigarettes. She smoked an average of .8 packs per day. She has never used smokeless tobacco. She reports current alcohol use. She reports current drug use. Drug: Marijuana.   Allergies  Allergen Reactions   Latex Swelling   Metoclopramide Nausea And Vomiting    Vomited for 8 hours    Metoclopramide Hcl Nausea And Vomiting    Vomited for 8 hours   Tape Rash    Adhesive tape    Family History  Problem Relation Age of Onset   Coronary artery disease Mother    Coronary artery disease Father    Heart attack Father    Hypertension Mother    Hypertension Paternal Grandmother    Diabetes Maternal Grandfather      Prior to Admission medications   Medication Sig Start Date End Date Taking? Authorizing Provider  acetaminophen (TYLENOL) 325 MG tablet Take 2 tablets (650 mg total) by mouth every 6 (six) hours as needed for mild pain or fever (or Fever >/= 101). 01/13/22   Emokpae, Courage, MD  amLODipine (NORVASC) 10 MG tablet Take 1 tablet (10 mg total) by mouth at bedtime. 01/13/22   Roxan Hockey, MD  buPROPion (WELLBUTRIN XL) 300 MG 24 hr  tablet Take 300 mg by mouth daily. 09/07/21   [provider]  EQ ASPIRIN ADULT LOW DOSE 81 MG tablet Take 1 tablet (81 mg total) by mouth daily with breakfast. 01/13/22   Emokpae, Courage, MD  escitalopram (LEXAPRO) 20 MG tablet Take 20 mg by mouth at bedtime. 05/08/21   [provider]  Fiber Adult Gummies 2 g CHEW Chew 2 Pieces of gum by mouth 3 (three) times daily before meals. 01/13/22   Roxan Hockey, MD  hydrochlorothiazide (HYDRODIURIL) 25 MG tablet Take 0.5 tablets (12.5 mg total) by mouth daily. 01/13/22   Roxan Hockey, MD  hydrocortisone  (ANUSOL-HC) 25 MG suppository Place 1 suppository (25 mg total) rectally every 12 (twelve) hours. 01/13/22 01/13/23  Roxan Hockey, MD  metFORMIN (GLUCOPHAGE-XR) 500 MG 24 hr tablet Take 500 mg by mouth every morning. 11/13/21   [provider]  pantoprazole (PROTONIX) 40 MG tablet Take 1 tablet (40 mg total) by mouth at bedtime. 01/13/22   Roxan Hockey, MD  polyethylene glycol (MIRALAX) 17 g packet Take 17 g by mouth daily as needed. 01/13/22   Roxan Hockey, MD  pravastatin (PRAVACHOL) 40 MG tablet Take 40 mg by mouth at bedtime. 05/10/21   [provider]  prochlorperazine (COMPAZINE) 25 MG suppository Place 1 suppository (25 mg total) rectally every 12 (twelve) hours as needed for nausea or vomiting. 01/13/22   Roxan Hockey, MD    Physical Exam: BP (!) 127/90   Pulse 78   Temp 98.2 F (36.8 C)   Resp 20   Ht '5\' 2"'$  (1.575 m)   Wt 85.3 kg   SpO2 97%   BMI 34.39 kg/m   General: 52 y.o. year-old female well developed well nourished in no acute distress.  Alert and oriented x3. HEENT: NCAT, EOMI Neck: Supple, trachea medial Cardiovascular: Regular rate and rhythm with no rubs or gallops.  No thyromegaly or JVD noted.  No lower extremity edema. 2/4 pulses in all 4 extremities. Respiratory: Clear to auscultation with no wheezes or rales. Good inspiratory effort. Abdomen: Soft, nontender nondistended with normal bowel sounds x4 quadrants. Muskuloskeletal: No cyanosis, clubbing or edema noted bilaterally Neuro: CN II-XII intact, strength 5/5 x 4, sensation, reflexes intact Skin: No ulcerative lesions noted or rashes Psychiatry: Judgement and insight appear normal. Mood is appropriate for condition and setting          Labs on Admission:  Basic Metabolic Panel: Recent Labs  Lab 07/19/22 2020 07/20/22 0224  NA 138  --   K 2.5* 2.4*  CL 93*  --   CO2 25  --   GLUCOSE 166*  --   BUN 28*  --   CREATININE 1.41*  --   CALCIUM 9.8  --   MG 2.0  --    Liver  Function Tests: Recent Labs  Lab 07/19/22 2020  AST 27  ALT 24  ALKPHOS 70  BILITOT 0.9  PROT 8.3*  ALBUMIN 4.7   Recent Labs  Lab 07/19/22 2020  LIPASE 34   No results for input(s): "AMMONIA" in the last 168 hours. CBC: Recent Labs  Lab 07/19/22 2020  WBC 10.8*  HGB 16.0*  HCT 45.7  MCV 86.6  PLT 389   Cardiac Enzymes: No results for input(s): "CKTOTAL", "CKMB", "CKMBINDEX", "TROPONINI" in the last 168 hours.  BNP (last 3 results) No results for input(s): "BNP" in the last 8760 hours.  ProBNP (last 3 results) No results for input(s): "PROBNP" in the last 8760 hours.  CBG:  No results for input(s): "GLUCAP" in the last 168 hours.  Radiological Exams on Admission: No results found.  EKG: I independently viewed the EKG done and my findings are as followed: Sinus tachycardia at a rate of 112 bpm with QTc 500 ms  Assessment/Plan Present on Admission:  Intractable nausea and vomiting  AKI (acute kidney injury) ([place])  Hypokalemia  Prolonged QT interval  Essential hypertension  GERD  Depression  Principal Problem:   Intractable nausea and vomiting Active Problems:   Hypokalemia   Depression   GERD   Essential hypertension   AKI (acute kidney injury) ([facility])   Prolonged QT interval   Type 2 diabetes mellitus with hyperglycemia (H[facility])   Mixed hyperlipidemia   Obesity (BMI 30-39.9)   Intractable nausea and vomiting It is unknown if this is due to cannabis hyperemesis syndrome, urine drug screen will be checked Continue Compazine as needed  Acute kidney injury BUN/creatinine 20/1.41 (baseline creatinine at 0.9-1.0). Continue IV hydration Renally adjust medications, avoid nephrotoxic agents/dehydration/hypotension  Hypokalemia K+ 2.5, this will be replenished  Prolonged QT interval QTc 579m Avoid QT prolonging drugs Magnesium level will be checked Repeat EKG in the morning  Type 2 diabetes mellitus with hyperglycemia Continue ISS and  hypoglycemic protocol  Essential hypertension Continue amlodipine  Mixed hyperlipidemia Continue pravastatin  GERD Continue Protonix  Depression Continue bupropion  Obesity (BMI 34.39) Continue diet and lifestyle modification  DVT prophylaxis: Lovenox  Code Status: Full code   Consults: None  Family Communication: Husband at bedside (all questions answered to satisfaction)   Severity of Illness: The appropriate patient status for this patient is INPATIENT. Inpatient status is judged to be reasonable and necessary in order to provide the required intensity of service to ensure the patient's safety. The patient's presenting symptoms, physical exam findings, and initial radiographic and laboratory data in the context of their chronic comorbidities is felt to place them at high risk for further clinical deterioration. Furthermore, it is not anticipated that the patient will be medically stable for discharge from the hospital within 2 midnights of admission.   * I certify that at the point of admission it is my clinical judgment that the patient will require inpatient hospital care spanning beyond 2 midnights from the point of admission due to high intensity of service, high risk for further deterioration and high frequency of surveillance required.*  Author: OBernadette Hoit DO 07/20/2022 6:29 AM  For on call review [email or web address]

## 2022-07-20 NOTE — Progress Notes (Addendum)
PROGRESS NOTE    Beverly Elliott  Beverly Elliott DOB: [birth year] DOA: 07/19/2022 PCP: [place] [place] [facility]  Chief Complaint  Patient presents with   Emesis    Brief Narrative:   Beverly Elliott is Beverly Elliott 52 y.o. female with medical history significant of hypertension, type 2 diabetes mellitus, GERD, hyperlipidemia, depression who presents to the emergency department accompanied by husband due to 4-day onset of uncontrolled vomiting.  Being treated for intractable nausea and vomiting, thought to be due cyclic vomiting or cannabinoid hyperemesis.    Assessment & Plan:   Principal Problem:   Intractable nausea and vomiting Active Problems:   Hypokalemia   Depression   GERD   Essential hypertension   AKI (acute kidney injury) (HCC)   Prolonged QT interval   Type 2 diabetes mellitus with hyperglycemia (HCC)   Mixed hyperlipidemia   Obesity (BMI 30-39.9)  Intractable nausea and vomiting Due to cannabinoid hyperemesis vs cyclic vomiting UDS with THC PPI IV  Compazine prn Capsaicin cream trial    Acute kidney injury BUN/creatinine 20/1.41 (baseline creatinine at 0.9-1.0). Continue IVF Hold thiazide   Hypokalemia Replace and follow   Prolonged QT interval QTc 551m Repeat EKG Mag 2   Type 2 diabetes mellitus with hyperglycemia SSI Hold metfomrin   Essential hypertension Continue amlodipine Hold HCTZ   Mixed hyperlipidemia Continue pravastatin   GERD Continue Protonix   Depression Continue bupropion   Obesity (BMI 34.39) Continue diet and lifestyle modification    DVT prophylaxis: lovenox Code Status: full Family Communication: none Disposition:   Status is: Inpatient Remains inpatient appropriate because: continued nausea, inability to tolerate PO   Consultants:  none  Procedures:  none  Antimicrobials:  Anti-infectives (From admission, onward)    None       Subjective: Thinks she can try the potassium when I see  her Discussed trialing the capsaicin cream   Objective: Vitals:   07/20/22 0525 07/20/22 0724 07/20/22 0826 07/20/22 1205  BP: (!) 127/90 (!) 128/92 128/88 133/89  Pulse: 78 71 65 61  Resp: '20 14 16 18  '$ Temp:  98.1 F (36.7 C) 98.5 F (36.9 C) 98.5 F (36.9 C)  TempSrc:  Oral Oral Oral  SpO2: 97% 99% 99% 99%  Weight:      Height:        Intake/Output Summary (Last 24 hours) at 07/20/2022 1614 Last data filed at 07/20/2022 1100 Gross per 24 hour  Intake 240 ml  Output --  Net 240 ml   Filed Weights   07/19/22 2005  Weight: 85.3 kg    Examination:  General exam: Appears calm , lying face down on bed Respiratory system: unlabored Cardiovascular system: RRR Central nervous system: Alert and oriented. No focal neurological deficits. Extremities:no LEE    Data Reviewed: I have personally reviewed following labs and imaging studies  CBC: Recent Labs  Lab 07/19/22 2020 07/20/22 0224  WBC 10.8* 11.7*  HGB 16.0* 15.1*  HCT 45.7 42.7  MCV 86.6 87.7  PLT 389 [id number]   Basic Metabolic Panel: Recent Labs  Lab 07/19/22 2020 07/20/22 0224 07/20/22 0540  NA 138  --  137  K 2.5* 2.4* 2.7*  CL 93*  --  97*  CO2 25  --  29  GLUCOSE 166*  --  106*  BUN 28*  --  25*  CREATININE 1.41*  --  1.40*  CALCIUM 9.8  --  8.7*  MG 2.0  --   --     GFR: Estimated  Creatinine Clearance: 49.3 mL/min (Beverly Elliott) (by C-G formula based on SCr of 1.4 mg/dL (H)).  Liver Function Tests: Recent Labs  Lab 07/19/22 2020 07/20/22 0540  AST 27 15  ALT 24 18  ALKPHOS 70 53  BILITOT 0.9 0.7  PROT 8.3* 6.5  ALBUMIN 4.7 3.7    CBG: Recent Labs  Lab 07/20/22 0725 07/20/22 0751 07/20/22 1115  GLUCAP 126* 108* 126*     No results found for this or any previous visit (from the past 240 hour(s)).       Radiology Studies: No results found.      Scheduled Meds:  amLODipine  10 mg Oral QHS   buPROPion  300 mg Oral Daily   capsaicin   Topical BID   enoxaparin (LOVENOX)  injection  40 mg Subcutaneous Q24H   insulin aspart  0-15 Units Subcutaneous Q4H   pantoprazole (PROTONIX) IV  40 mg Intravenous Q24H   pravastatin  40 mg Oral QHS   Continuous Infusions:  lactated ringers 1,000 mL with potassium chloride 40 mEq infusion 125 mL/hr at 07/20/22 1036   promethazine (PHENERGAN) injection (IM or IVPB) Stopped (07/20/22 0118)     LOS: 0 days    Time spent: over 30 min    Fayrene Helper, MD [facility]   To contact the attending provider between 7A-7P or the covering provider during after hours 7P-7A, please log into the web site [email or web address] and access using universal  password for that web site. If you do not have the password, please call the hospital operator.  07/20/2022, 4:14 PM

## 2022-07-20 NOTE — Progress Notes (Signed)
  Transition of Care (TOC) Screening Note   Patient Details  Name: Glendia Olshefski Date of Birth: July 22, 1970   Transition of Care [facility]) CM/SW Contact:    Ihor Gully, LCSW Phone Number: 07/20/2022, 11:40 AM    Transition of Care Department [facility]) has reviewed patient and no TOC needs have been identified at this time. We will continue to monitor patient advancement through interdisciplinary progression rounds. If new patient transition needs arise, please place a TOC consult.

## 2022-07-20 NOTE — Progress Notes (Signed)
Pt arrived to room 317 via WC from ED. Pt ambulatory to bed without assistance. Currently, no c/o n/v, sipping on clear liquids at this time. Oriented to room and safety procedures, states understanding. Call bell within reach, advised to call for needs.

## 2022-07-20 NOTE — ED Notes (Signed)
Repeat EKG completed per Dr Ulice Bold request.

## 2022-07-21 LAB — GLUCOSE, CAPILLARY
Glucose-Capillary: 89 mg/dL (ref 70–99)
Glucose-Capillary: 96 mg/dL (ref 70–99)
Glucose-Capillary: 128 mg/dL — ABNORMAL HIGH (ref 70–99)

## 2022-07-21 LAB — HEMOGLOBIN A1C
Hgb A1c MFr Bld: 5.9 % — ABNORMAL HIGH (ref 4.8–5.6)
Mean Plasma Glucose: 123 mg/dL

## 2022-07-21 LAB — MAGNESIUM: Magnesium: 2 mg/dL (ref 1.7–2.4)

## 2022-07-21 LAB — BASIC METABOLIC PANEL
Anion gap: 9 (ref 5–15)
BUN: 14 mg/dL (ref 6–20)
CO2: 27 mmol/L (ref 22–32)
Calcium: 8.5 mg/dL — ABNORMAL LOW (ref 8.9–10.3)
Chloride: 104 mmol/L (ref 98–111)
Creatinine, Ser: 1.11 mg/dL — ABNORMAL HIGH (ref 0.44–1.00)
GFR, Estimated: >60 mL/min (ref >60)
Glucose, Bld: 93 mg/dL (ref 70–99)
Potassium: 3.2 mmol/L — ABNORMAL LOW (ref 3.5–5.1)
Sodium: 140 mmol/L (ref 135–145)

## 2022-07-21 LAB — PHOSPHORUS: Phosphorus: 2.5 mg/dL (ref 2.5–4.6)

## 2022-07-21 MED ORDER — POTASSIUM CHLORIDE CRYS ER 20 MEQ PO TBCR
40.0000 meq | EXTENDED_RELEASE_TABLET | ORAL | Status: AC
  Administered 2022-07-21 (×2): 40 meq via ORAL
  Filled 2022-07-21 (×2): qty 2

## 2022-07-21 NOTE — Plan of Care (Signed)

## 2022-07-21 NOTE — Discharge Summary (Signed)
Physician Discharge Summary  Beverly Elliott [id number] DOB: Sep 01, 1970 DOA: 07/19/2022  PCP: [place], [place] date: 07/19/2022 Discharge date: 07/21/2022  Admitted From: Home Disposition: Home  Recommendations for Outpatient Follow-up:  Follow up with PCP in 1-2 weeks Encouraged decreased use/abstinence from [facility] as possible etiology to her presenting symptoms of intractable nausea/vomiting  Home Health: No Equipment/Devices: None  Discharge Condition: Stable CODE STATUS: Full code Diet recommendation: Heart healthy/consistent carb regular diet  History of present illness:  Beverly Elliott is a 52 year old female with past medical history significant for HTN, type 2 diabetes mellitus, HLD, GERD, depression who presents to [facility], ED on 3/11 with 4-day history of uncontrolled nausea/vomiting.  Patient with active use of marijuana which has been linked to cannabinoid hyperemesis syndrome in the past; although patient endorses that she did not use THC and roughly a year.  No improvement with symptoms with Zofran and complaining of heartburn.  Denies abdominal pain, no fever.  Unable to take her Protonix the last 4 days due to persistent vomiting.  In the ED, HR 106, BP 132/91, afebrile.  WBC 10.8, hemoglobin 16.2, platelet 389.  Sodium 138, potassium 2.5, chloride 93, bicarb 25, glucose 166.  BUN 20, creatinine 1.41.  Magnesium 2.0, lipase 34.  Urinalysis unrevealing.  UDS positive for THC.  Patient was given Protonix, IV potassium, Phenergan.  TRH consulted for further evaluation management of intractable nausea/vomiting.  Hospital course:  Intractable nausea/vomiting likely secondary to cannabinoid hyperemesis syndrome. Patient presenting to ED with intractable nausea/vomiting for 4 days.  Denies THC use over the past year although UDS positive for [facility] on admission.  Has had previous episodes of similar complaints likely linked to cannabinol use.   Patient was supported with IV fluid hydration, antiemetics, electrolytes repleted and patient's diet was slowly advanced with toleration.  Discussed need for abstinence of THC in the future.  Outpatient follow-up PCP.  Hypokalemia Repleted during hospitalization.  Essential hypertension Continue amlodipine 10 mg p.o. daily, HCTZ 25 mg p.o. daily.  Type 2 diabetes mellitus Continue metformin 500 mg p.o. daily  Hyperlipidemia Continue pravastatin 40 mg p.o. daily  Depression Continue bupropion 300 mg p.o. daily, Lexapro 20 mg p.o. daily.  GERD Continue Protonix 40 mg p.o. daily  Obesity Body mass index is 34.39 kg/m.  Discussed with patient needs for aggressive lifestyle changes/weight loss as this complicates all facets of care.  Outpatient follow-up with PCP.    Discharge Diagnoses:  Principal Problem:   Intractable nausea and vomiting Active Problems:   Hypokalemia   Depression   GERD   Essential hypertension   AKI (acute kidney injury) (HCC)   Prolonged QT interval   Type 2 diabetes mellitus with hyperglycemia (HCC)   Mixed hyperlipidemia   Obesity (BMI 30-39.9)    Discharge Instructions  Discharge Instructions     Call MD for:  difficulty breathing, headache or visual disturbances   Complete by: As directed    Call MD for:  extreme fatigue   Complete by: As directed    Call MD for:  persistant dizziness or light-headedness   Complete by: As directed    Call MD for:  persistant nausea and vomiting   Complete by: As directed    Call MD for:  severe uncontrolled pain   Complete by: As directed    Call MD for:  temperature >100.4   Complete by: As directed    Diet - low sodium heart healthy   Complete by: As directed  Increase activity slowly   Complete by: As directed       Allergies as of 07/21/2022       Reactions   Latex Swelling   Metoclopramide Nausea And Vomiting   Vomited for 8 hours   Metoclopramide Hcl Nausea And Vomiting   Vomited for 8  hours   Tape Rash   Adhesive tape        Medication List     TAKE these medications    acetaminophen 325 MG tablet Commonly known as: TYLENOL Take 2 tablets (650 mg total) by mouth every 6 (six) hours as needed for mild pain or fever (or Fever >/= 101).   amLODipine 10 MG tablet Commonly known as: NORVASC Take 1 tablet (10 mg total) by mouth at bedtime.   buPROPion 300 MG 24 hr tablet Commonly known as: WELLBUTRIN XL Take 300 mg by mouth daily.   EQ Aspirin Adult Low Dose 81 MG tablet Generic drug: aspirin EC Take 1 tablet (81 mg total) by mouth daily with breakfast.   escitalopram 20 MG tablet Commonly known as: LEXAPRO Take 20 mg by mouth at bedtime.   hydrochlorothiazide 25 MG tablet Commonly known as: HYDRODIURIL Take 0.5 tablets (12.5 mg total) by mouth daily. What changed: how much to take   hydrOXYzine 25 MG tablet Commonly known as: ATARAX Take 1 tablet by mouth 3 (three) times daily as needed for itching.   MAGNESIUM PO Take 1 tablet by mouth daily.   metFORMIN 500 MG 24 hr tablet Commonly known as: GLUCOPHAGE-XR Take 500 mg by mouth every morning.   pantoprazole 40 MG tablet Commonly known as: PROTONIX Take 1 tablet (40 mg total) by mouth at bedtime.   POTASSIUM PO Take 1 tablet by mouth daily.   pravastatin 40 MG tablet Commonly known as: PRAVACHOL Take 40 mg by mouth at bedtime.        Follow-up Information     [place]. Schedule an appointment as soon as possible for a visit in 1 week(s).   Specialty: Family Medicine Contact information: [address] [place] [address] [phone]                Allergies  Allergen Reactions   Latex Swelling   Metoclopramide Nausea And Vomiting    Vomited for 8 hours    Metoclopramide Hcl Nausea And Vomiting    Vomited for 8 hours   Tape Rash    Adhesive tape    Consultations: None   Procedures/Studies: No results  found.   Subjective: Patient seen examined at bedside, resting comfortably.  Sitting at edge of bed.  Tolerating advance diet.  Husband present.  Ready for discharge home.  No other specific questions or concerns at this time.  Denies headache, no dizziness, no chest pain, no palpitation, no fever/chills/night sweats, no current nausea/vomiting, no diarrhea, no abdominal pain, no cough/congestion, no focal weakness, no fatigue, no paresthesias.  No acute events overnight per nursing staff.  Discharge Exam: Vitals:   07/20/22 2048 07/21/22 0414  BP: 116/80 114/85  Pulse: 62 74  Resp: 18 18  Temp: 98.9 F (37.2 C) 99.1 F (37.3 C)  SpO2: 100% 99%   Vitals:   07/20/22 1205 07/20/22 1619 07/20/22 2048 07/21/22 0414  BP: 133/89 116/82 116/80 114/85  Pulse: 61 70 62 74  Resp: '18 20 18 18  '$ Temp: 98.5 F (36.9 C) 98.2 F (36.8 C) 98.9 F (37.2 C) 99.1 F (37.3 C)  TempSrc: Oral Oral Oral Oral  SpO2: 99% 99% 100% 99%  Weight:      Height:        Physical Exam: GEN: NAD, alert and oriented x 3, obese HEENT: NCAT, PERRL, EOMI, sclera clear, MMM PULM: CTAB w/o wheezes/crackles, normal respiratory effort, on room air CV: RRR w/o M/G/R GI: abd soft, NTND, NABS, no R/G/M MSK: no peripheral edema, muscle strength globally intact 5/5 bilateral upper/lower extremities NEURO: CN II-XII intact, no focal deficits, sensation to light touch intact PSYCH: normal mood/affect Integumentary: dry/intact, no rashes or wounds    The results of significant diagnostics from this hospitalization (including imaging, microbiology, ancillary and laboratory) are listed below for reference.     Microbiology: No results found for this or any previous visit (from the past 240 hour(s)).   Labs: BNP (last 3 results) No results for input(s): "BNP" in the last 8760 hours. Basic Metabolic Panel: Recent Labs  Lab 07/19/22 2020 07/20/22 0224 07/20/22 0540 07/20/22 1600 07/21/22 0405  NA 138  --  137  135 140  K 2.5* 2.4* 2.7* 3.0* 3.2*  CL 93*  --  97* 97* 104  CO2 25  --  '29 28 27  '$ GLUCOSE 166*  --  106* 101* 93  BUN 28*  --  25* 22* 14  CREATININE 1.41*  --  1.40* 1.24* 1.11*  CALCIUM 9.8  --  8.7* 8.5* 8.5*  MG 2.0  --   --   --  2.0  PHOS  --   --   --   --  2.5   Liver Function Tests: Recent Labs  Lab 07/19/22 2020 07/20/22 0540  AST 27 15  ALT 24 18  ALKPHOS 70 53  BILITOT 0.9 0.7  PROT 8.3* 6.5  ALBUMIN 4.7 3.7   Recent Labs  Lab 07/19/22 2020  LIPASE 34   No results for input(s): "AMMONIA" in the last 168 hours. CBC: Recent Labs  Lab 07/19/22 2020 07/20/22 0224  WBC 10.8* 11.7*  HGB 16.0* 15.1*  HCT 45.7 42.7  MCV 86.6 87.7  PLT 389 338   Cardiac Enzymes: No results for input(s): "CKTOTAL", "CKMB", "CKMBINDEX", "TROPONINI" in the last 168 hours. BNP: Invalid input(s): "POCBNP" CBG: Recent Labs  Lab 07/20/22 2000 07/20/22 2354 07/21/22 0400 07/21/22 0747 07/21/22 1131  GLUCAP 96 99 89 96 128*   D-Dimer No results for input(s): "DDIMER" in the last 72 hours. Hgb A1c Recent Labs    07/20/22 0224  HGBA1C 5.9*   Lipid Profile No results for input(s): "CHOL", "HDL", "LDLCALC", "TRIG", "CHOLHDL", "LDLDIRECT" in the last 72 hours. Thyroid function studies No results for input(s): "TSH", "T4TOTAL", "T3FREE", "THYROIDAB" in the last 72 hours.  Invalid input(s): "FREET3" Anemia work up No results for input(s): "VITAMINB12", "FOLATE", "FERRITIN", "TIBC", "IRON", "RETICCTPCT" in the last 72 hours. Urinalysis    Component Value Date/Time   COLORURINE YELLOW 07/20/2022 0314   APPEARANCEUR CLEAR 07/20/2022 0314   LABSPEC >1.030 (H) 07/20/2022 0314   PHURINE 6.0 07/20/2022 0314   GLUCOSEU NEGATIVE 07/20/2022 0314   HGBUR NEGATIVE 07/20/2022 0314   BILIRUBINUR NEGATIVE 07/20/2022 0314   KETONESUR NEGATIVE 07/20/2022 0314   PROTEINUR 100 (A) 07/20/2022 0314   UROBILINOGEN 0.2 09/03/2013 1353   NITRITE NEGATIVE 07/20/2022 0314   LEUKOCYTESUR  NEGATIVE 07/20/2022 0314   Sepsis Labs Recent Labs  Lab 07/19/22 2020 07/20/22 0224  WBC 10.8* 11.7*   Microbiology No results found for this or any previous visit (from the past 240 hour(s)).  Time coordinating discharge: Over 30 minutes  SIGNED:   Donnamarie Poag [place] DO  [facility] 07/21/2022, 11:37 AM

## 2022-11-04 ENCOUNTER — Encounter (HOSPITAL_COMMUNITY): Payer: Self-pay | Admitting: Emergency Medicine

## 2022-11-04 ENCOUNTER — Other Ambulatory Visit: Payer: Self-pay

## 2022-11-04 ENCOUNTER — Emergency Department (HOSPITAL_COMMUNITY)
Admission: EM | Admit: 2022-11-04 | Discharge: 2022-11-04 | Disposition: A | Discharge status: Home / Self Care | Payer: BC Managed Care – PPO | Attending: Emergency Medicine | Admitting: Emergency Medicine

## 2022-11-04 DIAGNOSIS — Z91040 Latex allergy status: Secondary | ICD-10-CM | POA: Insufficient documentation

## 2022-11-04 DIAGNOSIS — R1084 Generalized abdominal pain: Secondary | ICD-10-CM | POA: Insufficient documentation

## 2022-11-04 DIAGNOSIS — R799 Abnormal finding of blood chemistry, unspecified: Secondary | ICD-10-CM | POA: Diagnosis not present

## 2022-11-04 DIAGNOSIS — Z7982 Long term (current) use of aspirin: Secondary | ICD-10-CM | POA: Insufficient documentation

## 2022-11-04 DIAGNOSIS — R112 Nausea with vomiting, unspecified: Secondary | ICD-10-CM | POA: Diagnosis present

## 2022-11-04 LAB — COMPREHENSIVE METABOLIC PANEL
ALT: 23 U/L (ref 0–44)
AST: 23 U/L (ref 15–41)
Albumin: 4.4 g/dL (ref 3.5–5.0)
Alkaline Phosphatase: 64 U/L (ref 38–126)
Anion gap: 13 (ref 5–15)
BUN: 23 mg/dL — ABNORMAL HIGH (ref 6–20)
CO2: 21 mmol/L — ABNORMAL LOW (ref 22–32)
Calcium: 9.4 mg/dL (ref 8.9–10.3)
Chloride: 102 mmol/L (ref 98–111)
Creatinine, Ser: 1.28 mg/dL — ABNORMAL HIGH (ref 0.44–1.00)
GFR, Estimated: 51 mL/min — ABNORMAL LOW (ref >60)
Glucose, Bld: 167 mg/dL — ABNORMAL HIGH (ref 70–99)
Potassium: 2.9 mmol/L — ABNORMAL LOW (ref 3.5–5.1)
Sodium: 136 mmol/L (ref 135–145)
Total Bilirubin: 1.1 mg/dL (ref 0.3–1.2)
Total Protein: 7.8 g/dL (ref 6.5–8.1)

## 2022-11-04 LAB — URINALYSIS, ROUTINE W REFLEX MICROSCOPIC
Bilirubin Urine: NEGATIVE
Glucose, UA: 50 mg/dL — AB
Ketones, ur: 20 mg/dL — AB
Leukocytes,Ua: NEGATIVE
Nitrite: NEGATIVE
Protein, ur: 100 mg/dL — AB
Specific Gravity, Urine: 1.03 (ref 1.005–1.030)
pH: 5 (ref 5.0–8.0)

## 2022-11-04 LAB — CBG MONITORING, ED
Glucose-Capillary: 145 mg/dL — ABNORMAL HIGH (ref 70–99)
Glucose-Capillary: 95 mg/dL (ref 70–99)

## 2022-11-04 LAB — CBC
HCT: 43.7 % (ref 36.0–46.0)
Hemoglobin: 15 g/dL (ref 12.0–15.0)
MCH: 30.2 pg (ref 26.0–34.0)
MCHC: 34.3 g/dL (ref 30.0–36.0)
MCV: 88.1 fL (ref 80.0–100.0)
Platelets: 368 10*3/uL (ref 150–400)
RBC: 4.96 MIL/uL (ref 3.87–5.11)
RDW: 13.9 % (ref 11.5–15.5)
WBC: 10.2 10*3/uL (ref 4.0–10.5)
nRBC: 0 % (ref 0.0–0.2)

## 2022-11-04 LAB — LIPASE, BLOOD: Lipase: 27 U/L (ref 11–51)

## 2022-11-04 LAB — MAGNESIUM: Magnesium: 2.1 mg/dL (ref 1.7–2.4)

## 2022-11-04 MED ORDER — POTASSIUM CHLORIDE CRYS ER 20 MEQ PO TBCR: 20.0000 meq | EXTENDED_RELEASE_TABLET | Freq: Every day | ORAL | 0 refills | Status: DC

## 2022-11-04 MED ORDER — METOCLOPRAMIDE HCL 10 MG PO TABS: 10.0000 mg | ORAL_TABLET | Freq: Once | ORAL | Status: DC

## 2022-11-04 MED ORDER — POTASSIUM CHLORIDE CRYS ER 20 MEQ PO TBCR: 20.0000 meq | EXTENDED_RELEASE_TABLET | Freq: Once | ORAL | Status: DC

## 2022-11-04 MED ORDER — PROMETHAZINE HCL 25 MG RE SUPP
25.0000 mg | Freq: Three times a day (TID) | RECTAL | 0 refills | Status: DC | PRN
  Filled 2022-11-04: qty 12, 4d supply, fill #0

## 2022-11-04 MED ORDER — POTASSIUM CHLORIDE CRYS ER 20 MEQ PO TBCR
20.0000 meq | EXTENDED_RELEASE_TABLET | Freq: Every day | ORAL | 0 refills | Status: DC
  Filled 2022-11-04: qty 2, 2d supply, fill #0

## 2022-11-04 MED ORDER — PROMETHAZINE HCL 25 MG RE SUPP: 25.0000 mg | Freq: Three times a day (TID) | RECTAL | 0 refills | Status: DC | PRN

## 2022-11-04 MED ORDER — LACTATED RINGERS IV BOLUS
1000.0000 mL | Freq: Once | INTRAVENOUS | Status: AC
  Administered 2022-11-04: 1000 mL via INTRAVENOUS

## 2022-11-04 MED ORDER — POTASSIUM CHLORIDE 10 MEQ/100ML IV SOLN
10.0000 meq | INTRAVENOUS | Status: AC
  Administered 2022-11-04 (×2): 10 meq via INTRAVENOUS
  Filled 2022-11-04 (×2): qty 100

## 2022-11-04 MED ORDER — LORAZEPAM 2 MG/ML IJ SOLN
1.0000 mg | Freq: Once | INTRAMUSCULAR | Status: AC
  Administered 2022-11-04: 1 mg via INTRAVENOUS
  Filled 2022-11-04: qty 1

## 2022-11-04 NOTE — ED Provider Notes (Addendum)
[facility] AT [facility] Provider Note   CSN: [id number] Arrival date & time: 11/04/22  1147     History  Chief Complaint  Patient presents with   Allergic Reaction   Emesis   Abdominal Pain    Beverly Elliott is a 52 y.o. female.  She has PMH of major depressive disorder, cyclical vomiting with nausea, alpha gal syndrome,, prolonged QT interval.  She presents the ER today complaining of abdominal pain nausea vomiting with diffuse abdominal discomfort, had this in the past, thinks she was exposed to beef when she ate chicken last night was cross contaminated.  She states when this happens usually she needs Ativan or promethazine so she can sleep and she feels better, states Benadryl does not help with this.   Allergic Reaction Emesis Associated symptoms: abdominal pain   Abdominal Pain Associated symptoms: vomiting        Home Medications Prior to Admission medications   Medication Sig Start Date End Date Taking? Authorizing Provider  acetaminophen (TYLENOL) 325 MG tablet Take 2 tablets (650 mg total) by mouth every 6 (six) hours as needed for mild pain or fever (or Fever >/= 101). 01/13/22   Emokpae, Courage, MD  amLODipine (NORVASC) 10 MG tablet Take 1 tablet (10 mg total) by mouth at bedtime. 01/13/22   Shon Hale, MD  buPROPion (WELLBUTRIN XL) 300 MG 24 hr tablet Take 300 mg by mouth daily. 09/07/21   [provider]  EQ ASPIRIN ADULT LOW DOSE 81 MG tablet Take 1 tablet (81 mg total) by mouth daily with breakfast. 01/13/22   Emokpae, Courage, MD  escitalopram (LEXAPRO) 20 MG tablet Take 20 mg by mouth at bedtime. 05/08/21   [provider]  hydrochlorothiazide (HYDRODIURIL) 25 MG tablet Take 0.5 tablets (12.5 mg total) by mouth daily. Patient taking differently: Take 25 mg by mouth daily. 01/13/22   Shon Hale, MD  hydrOXYzine (ATARAX) 25 MG tablet Take 1 tablet by mouth 3 (three) times daily as needed for itching.  06/28/22   [provider]  MAGNESIUM PO Take 1 tablet by mouth daily.    [provider]  metFORMIN (GLUCOPHAGE-XR) 500 MG 24 hr tablet Take 500 mg by mouth every morning. 11/13/21   [provider]  pantoprazole (PROTONIX) 40 MG tablet Take 1 tablet (40 mg total) by mouth at bedtime. 01/13/22   Shon Hale, MD  potassium chloride SA (KLOR-CON M) 20 MEQ tablet Take 1 tablet (20 mEq total) by mouth daily. 11/04/22   Carmel Sacramento A, PA-C  POTASSIUM PO Take 1 tablet by mouth daily.    [provider]  pravastatin (PRAVACHOL) 40 MG tablet Take 40 mg by mouth at bedtime. 05/10/21   [provider]  promethazine (PHENERGAN) 25 MG suppository Place 1 suppository (25 mg total) rectally every 8 (eight) hours as needed for nausea or vomiting. 11/04/22   Cristi Loron, Ayuub Penley A, PA-C      Allergies    Latex, Metoclopramide, Metoclopramide hcl, and Tape    Review of Systems   Review of Systems  Gastrointestinal:  Positive for abdominal pain and vomiting.    Physical Exam Updated Vital Signs BP (!) 143/96 (BP Location: Right Arm)   Pulse 88   Temp 98.4 F (36.9 C) (Oral)   Resp 20   Ht 5\' 2"  (1.575 m)   Wt 85.7 kg   SpO2 97%   BMI 34.57 kg/m  Physical Exam Vitals and nursing note reviewed.  Constitutional:  General: She is not in acute distress.    Appearance: She is well-developed.  HENT:     Head: Normocephalic and atraumatic.  Eyes:     Conjunctiva/sclera: Conjunctivae normal.  Cardiovascular:     Rate and Rhythm: Normal rate and regular rhythm.     Heart sounds: No murmur heard. Pulmonary:     Effort: Pulmonary effort is normal. No respiratory distress.     Breath sounds: Normal breath sounds.  Abdominal:     Palpations: Abdomen is soft.     Tenderness: There is no abdominal tenderness. There is no guarding or rebound.  Musculoskeletal:        General: No swelling.     Cervical back: Neck supple.  Skin:    General: Skin is warm and  dry.     Capillary Refill: Capillary refill takes less than 2 seconds.  Neurological:     Mental Status: She is alert.  Psychiatric:        Mood and Affect: Mood normal.     ED Results / Procedures / Treatments   Labs (all labs ordered are listed, but only abnormal results are displayed) Labs Reviewed  COMPREHENSIVE METABOLIC PANEL - Abnormal; Notable for the following components:      Result Value   Potassium 2.9 (*)    CO2 21 (*)    Glucose, Bld 167 (*)    BUN 23 (*)    Creatinine, Ser 1.28 (*)    GFR, Estimated 51 (*)    All other components within normal limits  URINALYSIS, ROUTINE W REFLEX MICROSCOPIC - Abnormal; Notable for the following components:   APPearance HAZY (*)    Glucose, UA 50 (*)    Hgb urine dipstick SMALL (*)    Ketones, ur 20 (*)    Protein, ur 100 (*)    Bacteria, UA RARE (*)    All other components within normal limits  CBG MONITORING, ED - Abnormal; Notable for the following components:   Glucose-Capillary 145 (*)    All other components within normal limits  LIPASE, BLOOD  CBC  MAGNESIUM  CBG MONITORING, ED  CBG MONITORING, ED    EKG None  Radiology No results found.  Procedures .Critical Care  Performed by: Ma Rings, PA-C Authorized by: Ma Rings, PA-C   Critical care provider statement:    Critical care time (minutes):  30   Critical care was necessary to treat or prevent imminent or life-threatening deterioration of the following conditions:  Dehydration   Critical care was time spent personally by me on the following activities:  Development of treatment plan with patient or surrogate, discussions with consultants, evaluation of patient's response to treatment, examination of patient, ordering and review of laboratory studies, ordering and performing treatments and interventions, pulse oximetry, re-evaluation of patient's condition and review of old charts   Care discussed with comment:  ED attending Comments:      Patient was having nausea and vomiting, unable to tolerate p.o. fluids, had a typo kalemia in the setting of some QT prolongation.  She maintained on a cardiac monitor sinus tachycardia improved to sinus, required IV potassium due to inability to tolerate p.o. and risk of worsening of her prolonged QT,     Medications Ordered in ED Medications  potassium chloride SA (KLOR-CON M) CR tablet 20 mEq (20 mEq Oral Not Given 11/04/22 1834)  lactated ringers bolus 1,000 mL (0 mLs Intravenous Stopped 11/04/22 1833)  potassium chloride 10 mEq in 100 mL  IVPB (0 mEq Intravenous Stopped 11/04/22 1833)  LORazepam (ATIVAN) injection 1 mg (1 mg Intravenous Given 11/04/22 1409)    ED Course/ Medical Decision Making/ A&P                             Medical Decision Making Ddx: Allergic reaction, dehydration, electrolyte abnormality, cannabis hyperemesis syndrome, gastritis, other  ED course: Presents with abdominal pain nausea vomiting, she attributed it to possible contrast contamination of her food last night that she may have been exposed to be flaring up her alpha-gal.  The prior note review shows that she does have multiple visits with nausea vomiting abdominal pain thought to be cyclical vomiting versus possible cannabis hyperemesis syndrome.  As patient states she has not improved with Valium in the past week given that she felt much better given IV fluids potassium repleted IV due to her intractable nausea and vomiting initially, after period of observation she is able to tolerate p.o. ginger ale and wants to go home and sleep, she states the Zofran she was prescribed previously was not working at home and states Phenergan seems to work better so she is given some Phenergan suppositories in case she needs them at home and advised to follow-up with her primary care doctor and her neurologist at [facility].  She is agreeable plan of care and discharge instructions vitals are reassuring, EKG did show some QT  prolongation which was her baseline, she was maintained on the cardiac monitor while getting her IV potassium and had no problems with this.  States she has taken Zofran and promethazine in the past without any adverse events.  We discussed this EKG finding and she is aware of it.  Also discussed my attending while in the ED. Exam for treatment is benign, she has had this problem many times in the past do not feel CT is indicated at this time especially given her reassuring CBC improvement with fluids and Ativan Prolonged QT with hypokalemia and nausea vomiting with dehydration air is hypermobility of evaluate for anterior shin of patient's condition.  Due to this she was given IV potassium as well as fluids, she was frequently reevaluated by me, the patient was able to tolerate p.o. and able to be discharged    Amount and/or Complexity of Data Reviewed External Data Reviewed: labs, ECG and notes. Labs: ordered.    Details: Potassium low at 2.9, CO2 slightly decreased at 21, normal anion gap, BUN and creatinine are stable, no UTI  Risk Prescription drug management.           Final Clinical Impression(s) / ED Diagnoses Final diagnoses:  Nausea and vomiting, unspecified vomiting type    Rx / DC Orders ED Discharge Orders          Ordered    promethazine (PHENERGAN) 25 MG suppository  Every 8 hours PRN,   Status:  Discontinued        11/04/22 1833    potassium chloride SA (KLOR-CON M) 20 MEQ tablet  Daily,   Status:  Discontinued        11/04/22 1838    promethazine (PHENERGAN) 25 MG suppository  Every 8 hours PRN        11/04/22 1843    potassium chloride SA (KLOR-CON M) 20 MEQ tablet  Daily        11/04/22 1843              Cayleen Benjamin, [place] A,  PA-C 11/04/22 1908    Ma Rings, PA-C 11/04/22 1915    Vanetta Mulders, MD 11/06/22 1406

## 2022-11-04 NOTE — ED Notes (Signed)
Pt informed of need for urine sample.

## 2022-11-04 NOTE — ED Triage Notes (Signed)
Pt presents via POV c/o allergic reaction to beef protein with n/v/d and severe abdominal pain. She has alpha-gal syndrome and went out for supper last night for her birthday, and she thinks she may have eaten something that was cross-contaminated with beef. Pt has been vomiting since arrival to ER and appears unwell in triage.

## 2022-11-04 NOTE — ED Notes (Signed)
Female urinal given to pt

## 2022-11-04 NOTE — ED Notes (Signed)
Pt currently vomiting at this time--PA-C made aware

## 2022-11-04 NOTE — Discharge Instructions (Addendum)
Was a pleasure taking care of you today.  You were seen for nausea and vomiting possibly related to your alpha gal.  Your blood work showed low potassium but was otherwise reassuring.  We are giving you prescription for Phenergan to help with your nausea and vomiting.  Given potassium in the IV and by mouth here in the ER.  You prescription to take another dose tomorrow, make sure you are eating foods high in potassium such as orange juice, bananas, potatoes.  Follow-up closely with your primary care doctor.  Come back to the ER if you have increased nausea and vomiting and cannot hold down fluids, have abdominal pain a fever or other worrisome changes.

## 2022-11-05 ENCOUNTER — Other Ambulatory Visit (HOSPITAL_BASED_OUTPATIENT_CLINIC_OR_DEPARTMENT_OTHER): Payer: Self-pay

## 2022-12-02 ENCOUNTER — Other Ambulatory Visit: Payer: Self-pay

## 2022-12-02 ENCOUNTER — Emergency Department (HOSPITAL_COMMUNITY)
Admission: EM | Admit: 2022-12-02 | Discharge: 2022-12-02 | Disposition: A | Discharge status: Home / Self Care | Payer: BC Managed Care – PPO | Source: Home / Self Care | Attending: Emergency Medicine | Admitting: Emergency Medicine

## 2022-12-02 DIAGNOSIS — E876 Hypokalemia: Secondary | ICD-10-CM | POA: Insufficient documentation

## 2022-12-02 DIAGNOSIS — R112 Nausea with vomiting, unspecified: Secondary | ICD-10-CM | POA: Diagnosis present

## 2022-12-02 DIAGNOSIS — Z7982 Long term (current) use of aspirin: Secondary | ICD-10-CM | POA: Diagnosis not present

## 2022-12-02 DIAGNOSIS — E119 Type 2 diabetes mellitus without complications: Secondary | ICD-10-CM | POA: Diagnosis not present

## 2022-12-02 DIAGNOSIS — Z91040 Latex allergy status: Secondary | ICD-10-CM | POA: Diagnosis not present

## 2022-12-02 DIAGNOSIS — Z79899 Other long term (current) drug therapy: Secondary | ICD-10-CM | POA: Insufficient documentation

## 2022-12-02 DIAGNOSIS — Z7984 Long term (current) use of oral hypoglycemic drugs: Secondary | ICD-10-CM | POA: Diagnosis not present

## 2022-12-02 DIAGNOSIS — I10 Essential (primary) hypertension: Secondary | ICD-10-CM | POA: Diagnosis not present

## 2022-12-02 LAB — COMPREHENSIVE METABOLIC PANEL
ALT: 20 U/L (ref 0–44)
AST: 23 U/L (ref 15–41)
Albumin: 4.7 g/dL (ref 3.5–5.0)
Alkaline Phosphatase: 64 U/L (ref 38–126)
Anion gap: 13 (ref 5–15)
BUN: 25 mg/dL — ABNORMAL HIGH (ref 6–20)
CO2: 24 mmol/L (ref 22–32)
Calcium: 9.6 mg/dL (ref 8.9–10.3)
Chloride: 98 mmol/L (ref 98–111)
Creatinine, Ser: 1.14 mg/dL — ABNORMAL HIGH (ref 0.44–1.00)
GFR, Estimated: 59 mL/min — ABNORMAL LOW (ref >60)
Glucose, Bld: 175 mg/dL — ABNORMAL HIGH (ref 70–99)
Potassium: 3 mmol/L — ABNORMAL LOW (ref 3.5–5.1)
Sodium: 135 mmol/L (ref 135–145)
Total Bilirubin: 0.8 mg/dL (ref 0.3–1.2)
Total Protein: 8.2 g/dL — ABNORMAL HIGH (ref 6.5–8.1)

## 2022-12-02 LAB — CBC
HCT: 46 % (ref 36.0–46.0)
Hemoglobin: 15.9 g/dL — ABNORMAL HIGH (ref 12.0–15.0)
MCH: 30.1 pg (ref 26.0–34.0)
MCHC: 34.6 g/dL (ref 30.0–36.0)
MCV: 87 fL (ref 80.0–100.0)
Platelets: 345 10*3/uL (ref 150–400)
RBC: 5.29 MIL/uL — ABNORMAL HIGH (ref 3.87–5.11)
RDW: 13.3 % (ref 11.5–15.5)
WBC: 10.1 10*3/uL (ref 4.0–10.5)
nRBC: 0 % (ref 0.0–0.2)

## 2022-12-02 LAB — LIPASE, BLOOD: Lipase: 27 U/L (ref 11–51)

## 2022-12-02 LAB — MAGNESIUM: Magnesium: 2 mg/dL (ref 1.7–2.4)

## 2022-12-02 MED ORDER — LORAZEPAM 2 MG/ML IJ SOLN
1.0000 mg | Freq: Once | INTRAMUSCULAR | Status: AC
  Administered 2022-12-02: 1 mg via INTRAVENOUS
  Filled 2022-12-02: qty 1

## 2022-12-02 MED ORDER — POTASSIUM CHLORIDE 10 MEQ/100ML IV SOLN
10.0000 meq | INTRAVENOUS | Status: DC
  Administered 2022-12-02: 10 meq via INTRAVENOUS
  Filled 2022-12-02: qty 100

## 2022-12-02 MED ORDER — LACTATED RINGERS IV BOLUS
1000.0000 mL | Freq: Once | INTRAVENOUS | Status: AC
  Administered 2022-12-02: 1000 mL via INTRAVENOUS

## 2022-12-02 NOTE — Discharge Instructions (Signed)
You were seen in the ER with concerns of nausea and vomiting due to consumption of gatorade. Your labs showed mild hypokalemia likely due to the vomiting you had experienced. Given difficulty taking Zofran at home, I would advise trying to slowly introduce oral intake with mild foods to reduce the risk of upsetting your stomach. Follow up with your primary care doctor or return to the ER if symptoms are worsening.

## 2022-12-02 NOTE — ED Notes (Addendum)
Patient called this RN into room and stated potassium was burning IV. Potassium rate decreased, provider notified.

## 2022-12-02 NOTE — ED Notes (Signed)
Patient with immediate relief upon administration of ativan. Patient resting comfortably with NAD. Patient denies any needs at this time.

## 2022-12-02 NOTE — ED Notes (Addendum)
Provider at bedside

## 2022-12-02 NOTE — ED Triage Notes (Signed)
Pt presents c/o allergic reaction to Gatorade?, vomiting and abdominal pain, history of alpha-gal syndrome.

## 2022-12-02 NOTE — ED Provider Notes (Signed)
Patient is a handoff from [place], [place]. Plan is for continued management of nausea and vomiting. Hx of cyclic vomiting. Treating hypokalemia. If stable, can discharge home. Low likelihood of imaging as patient is not particularly tender in abdomen. Physical Exam  BP (!) 164/106   Pulse 95   Temp 98.6 F (37 C) (Oral)   Resp 20   Ht 5\' 2"  (1.575 m)   Wt 82.6 kg   SpO2 93%   BMI 33.29 kg/m   Physical Exam Vitals and nursing note reviewed.  Constitutional:      General: She is not in acute distress.    Appearance: Normal appearance. She is not ill-appearing or toxic-appearing.  HENT:     Mouth/Throat:     Mouth: Mucous membranes are moist.     Pharynx: Oropharynx is clear.  Eyes:     Conjunctiva/sclera: Conjunctivae normal.     Pupils: Pupils are equal, round, and reactive to light.  Cardiovascular:     Rate and Rhythm: Normal rate and regular rhythm.     Pulses: Normal pulses.  Pulmonary:     Effort: Pulmonary effort is normal. No respiratory distress.  Abdominal:     Palpations: Abdomen is soft.     Tenderness: There is no abdominal tenderness.  Skin:    General: Skin is warm.     Capillary Refill: Capillary refill takes less than 2 seconds.  Neurological:     General: No focal deficit present.     Mental Status: She is alert.     Sensory: No sensory deficit.     Motor: No weakness.     Procedures  Procedures  ED Course / MDM    Medical Decision Making Amount and/or Complexity of Data Reviewed Labs: ordered.  Risk Prescription drug management.   Patient is a handoff from [place], [place]. Please see their note for full HPI and physical exam findings. Plan is for continued management of nausea and vomiting. Patient with difficulty with Zofran and reports responding better to Ativan for nausea control. Nausea and 2 runs of K+ ordered.  Patient no longer vomiting. States K+ is burning with administration so has not received a full run after 2 hours. Given that  vomiting is controlled at this point and potassium was only at 3.0, will discontinue IV potassium. Patient is agreeable with this plan and is safe for discharge home. All questions answered prior to patient discharge.       Smitty Knudsen, PA-C 12/02/22 2145    Bethann Berkshire, MD 12/03/22 1745

## 2022-12-02 NOTE — ED Notes (Signed)
This RN assumed care of patient at 1900. Patient is alert and oriented x 4. Patient in obvious pain. Patient reports laying on stomach is only way to relieve pain. Respirations even and unlabored. Patient vomiting, but states anti-nausea medications do not work. This RN explained to patient medications were ordered. Patient verbalized understanding. Support person at bedside.

## 2022-12-02 NOTE — ED Provider Notes (Signed)
[place] EMERGENCY DEPARTMENT AT [facility] Provider Note   CSN: [id number] Arrival date & time: 12/02/22  1637     History  Chief Complaint  Patient presents with   Allergic Reaction    Ligaya Cormier is a 52 y.o. female.   Allergic Reaction Presenting symptoms: no difficulty swallowing and no rash        Jessye Imhoff is a 52 y.o. female past medical history of anemia, hypertension, generalized anxiety disorder, cyclic vomiting, prior prolonged QT interval, type 2 diabetes, and alpha gal, recently diagnosed.  she presents to the Emergency Department complaining of persistent nausea and vomiting since this morning.  Some cramping abdominal pain as well.  Concerned that she has an allergic reaction to Gatorade stating that she was drinking Gatorade yesterday for hydration began having persistent vomiting this morning.  She believes that Gatorade may contain some meat products.  She denies any rash, difficulty swallowing, swelling of her face lips or tongue.  States that persistent vomiting is usually what is occurred in the past with her alpha gal.  Home Medications Prior to Admission medications   Medication Sig Start Date End Date Taking? Authorizing Provider  acetaminophen (TYLENOL) 325 MG tablet Take 2 tablets (650 mg total) by mouth every 6 (six) hours as needed for mild pain or fever (or Fever >/= 101). 01/13/22   Emokpae, Courage, MD  amLODipine (NORVASC) 10 MG tablet Take 1 tablet (10 mg total) by mouth at bedtime. 01/13/22   Shon Hale, MD  buPROPion (WELLBUTRIN XL) 300 MG 24 hr tablet Take 300 mg by mouth daily. 09/07/21   [provider]  EQ ASPIRIN ADULT LOW DOSE 81 MG tablet Take 1 tablet (81 mg total) by mouth daily with breakfast. 01/13/22   Emokpae, Courage, MD  escitalopram (LEXAPRO) 20 MG tablet Take 20 mg by mouth at bedtime. 05/08/21   [provider]  hydrochlorothiazide (HYDRODIURIL) 25 MG tablet Take 0.5 tablets (12.5 mg total)  by mouth daily. Patient taking differently: Take 25 mg by mouth daily. 01/13/22   Shon Hale, MD  hydrOXYzine (ATARAX) 25 MG tablet Take 1 tablet by mouth 3 (three) times daily as needed for itching. 06/28/22   [provider]  MAGNESIUM PO Take 1 tablet by mouth daily.    [provider]  metFORMIN (GLUCOPHAGE-XR) 500 MG 24 hr tablet Take 500 mg by mouth every morning. 11/13/21   [provider]  pantoprazole (PROTONIX) 40 MG tablet Take 1 tablet (40 mg total) by mouth at bedtime. 01/13/22   Shon Hale, MD  potassium chloride SA (KLOR-CON M) 20 MEQ tablet Take 1 tablet (20 mEq total) by mouth daily. 11/04/22   Carmel Sacramento A, PA-C  POTASSIUM PO Take 1 tablet by mouth daily.    [provider]  pravastatin (PRAVACHOL) 40 MG tablet Take 40 mg by mouth at bedtime. 05/10/21   [provider]  promethazine (PHENERGAN) 25 MG suppository Place 1 suppository (25 mg total) rectally every 8 (eight) hours as needed for nausea or vomiting. 11/04/22   Cristi Loron, Celeste A, PA-C      Allergies    Latex, Metoclopramide, Metoclopramide hcl, and Tape    Review of Systems   Review of Systems  Constitutional:  Negative for appetite change, chills and fever.  HENT:  Negative for sore throat and trouble swallowing.   Respiratory:  Negative for shortness of breath.   Cardiovascular:  Negative for chest pain.  Gastrointestinal:  Positive for abdominal pain, nausea  and vomiting. Negative for diarrhea.  Genitourinary:  Negative for dysuria.  Musculoskeletal:  Negative for back pain.  Skin:  Negative for rash.  Neurological:  Negative for dizziness, weakness and headaches.    Physical Exam Updated Vital Signs BP (!) 164/106   Pulse 90   Temp 98.6 F (37 C) (Oral)   Resp 20   Ht 5\' 2"  (1.575 m)   Wt 82.6 kg   SpO2 95%   BMI 33.29 kg/m  Physical Exam Vitals and nursing note reviewed.  Constitutional:      General: She is not in acute distress.     Appearance: Normal appearance. She is ill-appearing. She is not toxic-appearing.  HENT:     Mouth/Throat:     Mouth: Mucous membranes are moist.     Pharynx: Oropharynx is clear.     Comments: Uvula midline nonedematous.  No oral lesions.  No edema of the face, lips or tongue Eyes:     Conjunctiva/sclera: Conjunctivae normal.     Pupils: Pupils are equal, round, and reactive to light.  Cardiovascular:     Rate and Rhythm: Normal rate and regular rhythm.     Pulses: Normal pulses.  Pulmonary:     Effort: Pulmonary effort is normal. No respiratory distress.  Abdominal:     Palpations: Abdomen is soft.     Tenderness: There is no abdominal tenderness.  Skin:    General: Skin is warm.     Capillary Refill: Capillary refill takes less than 2 seconds.  Neurological:     General: No focal deficit present.     Mental Status: She is alert.     Sensory: No sensory deficit.     Motor: No weakness.     ED Results / Procedures / Treatments   Labs (all labs ordered are listed, but only abnormal results are displayed) Labs Reviewed  COMPREHENSIVE METABOLIC PANEL - Abnormal; Notable for the following components:      Result Value   Potassium 3.0 (*)    Glucose, Bld 175 (*)    BUN 25 (*)    Creatinine, Ser 1.14 (*)    Total Protein 8.2 (*)    GFR, Estimated 59 (*)    All other components within normal limits  CBC - Abnormal; Notable for the following components:   RBC 5.29 (*)    Hemoglobin 15.9 (*)    All other components within normal limits  LIPASE, BLOOD  URINALYSIS, ROUTINE W REFLEX MICROSCOPIC  MAGNESIUM    EKG None  Radiology No results found.  Procedures Procedures    Medications Ordered in ED Medications  lactated ringers bolus 1,000 mL (has no administration in time range)  LORazepam (ATIVAN) injection 1 mg (has no administration in time range)  potassium chloride 10 mEq in 100 mL IVPB (has no administration in time range)    ED Course/ Medical Decision  Making/ A&P                             Medical Decision Making Patient here with history of alpha gal, cyclic vomiting.  Drink Gatorade most of the day yesterday.  Believes that Gatorade may contain meat products and concerned that she has allergic reaction related to her alpha gal.  Describes multiple episodes of vomiting every 10 to 15 minutes since this morning.  History of prolonged QT, tried Phenergan suppository at home earlier today without relief.  Actively vomiting on my exam  Amount and/or Complexity of Data Reviewed Labs: ordered.    Details: Labs interpreted by me, no evidence of significant leukocytosis, lipase unremarkable, chemistries show hypokalemia with potassium of 3, serum creatinine 1.14 today this is near patient's baseline. Discussion of management or test interpretation with external provider(s): Patient actively vomiting on exam, here in June with similar symptoms.  She notes that her vomiting and typically only improves after IV Ativan.  Will avoid ondansetron due to prior prolonged QT.  She does have some hypokalemia here, she will be placed on cardiac monitor and IV potassium ordered as she is unable to tolerate orals at this time.  Discussed with Maryanna Shape, PA-C at end of shift.  EKG and few lab results still pending.  If pt's feeling better after IV fluids, potassium and Ativan I anticipate discharge home  Risk Prescription drug management.           Final Clinical Impression(s) / ED Diagnoses Final diagnoses:  None    Rx / DC Orders ED Discharge Orders     None         Rosey Bath 12/02/22 1949    Bethann Berkshire, MD 12/03/22 1745

## 2022-12-24 ENCOUNTER — Ambulatory Visit: Payer: BC Managed Care – PPO | Admitting: Internal Medicine

## 2022-12-24 ENCOUNTER — Encounter: Payer: Self-pay | Admitting: Internal Medicine

## 2022-12-24 VITALS — BP 122/80 | HR 76 | Temp 98.1°F | Resp 16 | Ht 62.1 in | Wt 184.8 lb

## 2022-12-24 DIAGNOSIS — H1045 Other chronic allergic conjunctivitis: Secondary | ICD-10-CM

## 2022-12-24 DIAGNOSIS — J3089 Other allergic rhinitis: Secondary | ICD-10-CM | POA: Diagnosis not present

## 2022-12-24 DIAGNOSIS — Z91018 Allergy to other foods: Secondary | ICD-10-CM | POA: Diagnosis not present

## 2022-12-24 DIAGNOSIS — J302 Other seasonal allergic rhinitis: Secondary | ICD-10-CM | POA: Diagnosis not present

## 2022-12-24 MED ORDER — EPINEPHRINE 0.3 MG/0.3ML IJ SOAJ: 0.3000 mg | INTRAMUSCULAR | 1 refills | Status: AC | PRN

## 2022-12-24 MED ORDER — CETIRIZINE HCL 10 MG PO TABS
10.0000 mg | ORAL_TABLET | Freq: Every day | ORAL | 5 refills | Status: DC
Start: 2022-12-24 — End: 2023-05-16

## 2022-12-24 MED ORDER — FLUTICASONE PROPIONATE 50 MCG/ACT NA SUSP: 1.0000 | Freq: Two times a day (BID) | NASAL | 2 refills | Status: DC

## 2022-12-24 MED ORDER — OMALIZUMAB 150 MG ~~LOC~~ SOLR
150.0000 mg | SUBCUTANEOUS | Status: DC
  Administered 2022-12-24 – 2023-05-02 (×4): 150 mg via SUBCUTANEOUS

## 2022-12-24 MED ORDER — OMALIZUMAB 150 MG ~~LOC~~ SOLR
300.0000 mg | SUBCUTANEOUS | Status: DC
  Administered 2022-12-24: 300 mg via SUBCUTANEOUS

## 2022-12-24 NOTE — Patient Instructions (Signed)
Food allergy:  - please strictly avoid gelatin, milk products, mammalian meat   - for SKIN only reaction, okay to take Allegra 180mg  24 hours (gelatin free)   every  12  hours - for SKIN + ANY additional symptoms, OR IF concern for LIFE THREATENING reaction = Epipen Autoinjector EpiPen 0.3 mg. - If using Epinephrine autoinjector, call 911 - A food allergy action plan has been provided and discussed. - Medic Alert identification is recommended. - Will start xolair 150mg  every 4 weeks, sample given today in clinic   -Our Biologic Coordinator Tammy will be reaching out to you about approval process   Chronic Rhinitis Seasonal and Perennial Allergic: - allergy testing today: grass, weed, tree, mold, dog   - Prevention:  - allergen avoidance when possible - consider allergy shots as long term control of your symptoms by teaching your immune system to be more tolerant of your allergy triggers  - Symptom control: - Consider Nasal Steroid Spray: Best results if used daily. - Options include Flonase (fluticasone), Nasocort (triamcinolone), Nasonex (mometasome) 1- 2 sprays in each nostril daily.  - All can be purchased over-the-counter if not covered by insurance. - Start Antihistamine: daily or daily as needed.   -Options include Zyrtec (Cetirizine) 10mg , Claritin (Loratadine) 10mg , Allegra (Fexofenadine) 180mg , or Xyzal (Levocetirinze) 5mg  - Can be purchased over-the-counter if not covered by insurance.  Follow up: 6 months   Thank you so much for letting me partake in your care today.  Don't hesitate to reach out if you have any additional concerns!  Ferol Luz, MD  [facility] [place], [place]  Reducing Pollen Exposure  The American Academy of Allergy, Asthma and Immunology suggests the following steps to reduce your exposure to pollen during allergy seasons.    Do not hang sheets or clothing out to dry; pollen may collect on these items. Do not mow lawns or spend  time around freshly cut grass; mowing stirs up pollen. Keep windows closed at night.  Keep car windows closed while driving. Minimize morning activities outdoors, a time when pollen counts are usually at their highest. Stay indoors as much as possible when pollen counts or humidity is high and on windy days when pollen tends to remain in the air longer. Use air conditioning when possible.  Many air conditioners have filters that trap the pollen spores. Use a HEPA room air filter to remove pollen form the indoor air you breathe.  Control of Mold Allergen   Mold and fungi can grow on a variety of surfaces provided certain temperature and moisture conditions exist.  Outdoor molds grow on plants, decaying vegetation and soil.  The major outdoor mold, Alternaria and Cladosporium, are found in very high numbers during hot and dry conditions.  Generally, a late Summer - Fall peak is seen for common outdoor fungal spores.  Rain will temporarily lower outdoor mold spore count, but counts rise rapidly when the rainy period ends.  The most important indoor molds are Aspergillus and Penicillium.  Dark, humid and poorly ventilated basements are ideal sites for mold growth.  The next most common sites of mold growth are the bathroom and the kitchen.  Outdoor (Seasonal) Mold Control  Use air conditioning and keep windows closed Avoid exposure to decaying vegetation. Avoid leaf raking. Avoid grain handling. Consider wearing a face mask if working in moldy areas.    Indoor (Perennial) Mold Control   Maintain humidity below 50%. Clean washable surfaces with 5% bleach solution. Remove  sources e.g. contaminated carpets.  Control of Dog or Cat Allergen  Avoidance is the best way to manage a dog or cat allergy. If you have a dog or cat and are allergic to dog or cats, consider removing the dog or cat from the home. If you have a dog or cat but don't want to find it a new home, or if your family wants a pet  even though someone in the household is allergic, here are some strategies that may help keep symptoms at bay:  Keep the pet out of your bedroom and restrict it to only a few rooms. Be advised that keeping the dog or cat in only one room will not limit the allergens to that room. Don't pet, hug or kiss the dog or cat; if you do, wash your hands with soap and water. High-efficiency particulate air (HEPA) cleaners run continuously in a bedroom or living room can reduce allergen levels over time. Regular use of a high-efficiency vacuum cleaner or a central vacuum can reduce allergen levels. Giving your dog or cat a bath at least once a week can reduce airborne allergen.

## 2022-12-24 NOTE — Progress Notes (Signed)
Immunotherapy   Patient Details  Name: Janayia Cuba MRN: [id number] Date of Birth: 1970-11-06  12/24/2022  [name] Started Xolair today in her office visit. She was given a sample of Xolair 150 mg. She waited in the office without any reactions. Following schedule: for Xolair  Frequency:every 4 weeks  Consent signed and patient instructions given.   Florence Canner 12/24/2022, 3:36 PM

## 2022-12-24 NOTE — Progress Notes (Signed)
New Patient Note  RE: Beverly Elliott MRN: [id number] DOB: July 14, 1970 Date of Office Visit: 12/24/2022  Consult requested by: Briant Sites, MD Primary care provider: Trisha Mangle, FNP  Chief Complaint: Allergies (Tested slightly positive for alpha gal three months ago)  History of Present Illness: I had the pleasure of seeing Beverly Elliott for initial evaluation at the [facility] on 12/31/2022. She is a 52 y.o. female, who is referred here by Trisha Mangle, FNP for the evaluation of alpha gal .  History obtained from patient, chart review and  Husband .  Over the past year She has noticed she developed diffuse vomiting about 4 hours after eating red meat.  It has progressed to include all forms of dairy to include baked milk and gelatin.  She got sIgE to alpha gal which returned positive at 0.11.  She does have an epipen.  For her reglan, zofran and phenergan does not help vomiting, ativan helps.   She also reports history of seasonal allergic rhinitis, worse in spring.  No prior allergy testing.  Will treat with as needed antihistamines.  Denies animal triggers or prior sinus surgeries.   Assessment and Plan: Beverly Elliott is a 52 y.o. female with: Allergy to alpha-gal  Seasonal and perennial allergic rhinitis - Plan: Allergy Test  Other chronic allergic conjunctivitis of both eyes   Plan: Patient Instructions  Food allergy:  - please strictly avoid gelatin, milk products, mammalian meat   - for SKIN only reaction, okay to take Allegra 180mg  24 hours (gelatin free)   every  12  hours - for SKIN + ANY additional symptoms, OR IF concern for LIFE THREATENING reaction = Epipen Autoinjector EpiPen 0.3 mg. - If using Epinephrine autoinjector, call 911 - A food allergy action plan has been provided and discussed. - Medic Alert identification is recommended. - Will start xolair 150mg  every 4 weeks, sample given today in clinic   -Our Biologic Coordinator  Tammy will be reaching out to you about approval process   Chronic Rhinitis Seasonal and Perennial Allergic: - allergy testing today: grass, weed, tree, mold, dog   - Prevention:  - allergen avoidance when possible - consider allergy shots as long term control of your symptoms by teaching your immune system to be more tolerant of your allergy triggers  - Symptom control: - Consider Nasal Steroid Spray: Best results if used daily. - Options include Flonase (fluticasone), Nasocort (triamcinolone), Nasonex (mometasome) 1- 2 sprays in each nostril daily.  - All can be purchased over-the-counter if not covered by insurance. - Start Antihistamine: daily or daily as needed.   -Options include Zyrtec (Cetirizine) 10mg , Claritin (Loratadine) 10mg , Allegra (Fexofenadine) 180mg , or Xyzal (Levocetirinze) 5mg  - Can be purchased over-the-counter if not covered by insurance.  Follow up: 6 months   Thank you so much for letting me partake in your care today.  Don't hesitate to reach out if you have any additional concerns!  Ferol Luz, MD  [facility] [place] [place]  Reducing Pollen Exposure  The American Academy of Allergy, Asthma and Immunology suggests the following steps to reduce your exposure to pollen during allergy seasons.    Do not hang sheets or clothing out to dry; pollen may collect on these items. Do not mow lawns or spend time around freshly cut grass; mowing stirs up pollen. Keep windows closed at night.  Keep car windows closed while driving. Minimize morning activities outdoors, a time when pollen counts  are usually at their highest. Stay indoors as much as possible when pollen counts or humidity is high and on windy days when pollen tends to remain in the air longer. Use air conditioning when possible.  Many air conditioners have filters that trap the pollen spores. Use a HEPA room air filter to remove pollen form the indoor air you breathe.  Control of  Mold Allergen   Mold and fungi can grow on a variety of surfaces provided certain temperature and moisture conditions exist.  Outdoor molds grow on plants, decaying vegetation and soil.  The major outdoor mold, Alternaria and Cladosporium, are found in very high numbers during hot and dry conditions.  Generally, a late Summer - Fall peak is seen for common outdoor fungal spores.  Rain will temporarily lower outdoor mold spore count, but counts rise rapidly when the rainy period ends.  The most important indoor molds are Aspergillus and Penicillium.  Dark, humid and poorly ventilated basements are ideal sites for mold growth.  The next most common sites of mold growth are the bathroom and the kitchen.  Outdoor (Seasonal) Mold Control  Use air conditioning and keep windows closed Avoid exposure to decaying vegetation. Avoid leaf raking. Avoid grain handling. Consider wearing a face mask if working in moldy areas.    Indoor (Perennial) Mold Control   Maintain humidity below 50%. Clean washable surfaces with 5% bleach solution. Remove sources e.g. contaminated carpets.  Control of Dog or Cat Allergen  Avoidance is the best way to manage a dog or cat allergy. If you have a dog or cat and are allergic to dog or cats, consider removing the dog or cat from the home. If you have a dog or cat but don't want to find it a new home, or if your family wants a pet even though someone in the household is allergic, here are some strategies that may help keep symptoms at bay:  Keep the pet out of your bedroom and restrict it to only a few rooms. Be advised that keeping the dog or cat in only one room will not limit the allergens to that room. Don't pet, hug or kiss the dog or cat; if you do, wash your hands with soap and water. High-efficiency particulate air (HEPA) cleaners run continuously in a bedroom or living room can reduce allergen levels over time. Regular use of a high-efficiency vacuum cleaner or  a central vacuum can reduce allergen levels. Giving your dog or cat a bath at least once a week can reduce airborne allergen.     Meds ordered this encounter  Medications   DISCONTD: omalizumab Geoffry Paradise) injection 300 mg   omalizumab Geoffry Paradise) injection 150 mg   EPINEPHrine 0.3 mg/0.3 mL IJ SOAJ injection    Sig: Inject 0.3 mg into the muscle as needed for anaphylaxis.    Dispense:  1 each    Refill:  1   cetirizine (ZYRTEC) 10 MG tablet    Sig: Take 1 tablet (10 mg total) by mouth daily.    Dispense:  30 tablet    Refill:  5   DISCONTD: fluticasone (FLONASE) 50 MCG/ACT nasal spray    Sig: Place 1 spray into both nostrils 2 (two) times daily.    Dispense:  48 g    Refill:  2   Lab Orders  No laboratory test(s) ordered today    Other allergy screening: Asthma: no Rhino conjunctivitis: yes: Food allergy: yes Medication allergy:  reglan: causes vomiting  Hymenoptera allergy:  no Urticaria: yes Eczema:no History of recurrent infections suggestive of immunodeficency: no  Diagnostics:  Skin Testing: Environmental allergy panel.  adequate controls  Results interpreted by myself and discussed with patient/family.   Past Medical History: Patient Active Problem List   Diagnosis Date Noted   Intractable nausea and vomiting 07/20/2022   Type 2 diabetes mellitus with hyperglycemia (HCC) 07/20/2022   Mixed hyperlipidemia 07/20/2022   Obesity (BMI 30-39.9) 07/20/2022   Leukocytosis 01/11/2022   AKI (acute kidney injury) (HCC) 11/19/2021   Hypokalemia 11/19/2021   Intractable vomiting 11/19/2021   Prolonged QT interval 11/19/2021   Excessive daytime sleepiness 01/06/2021   Obstructive sleep apnea 01/06/2021   Chronic back pain 09/01/2020   Cyclical vomiting with nausea 09/20/2019   Severe obesity (BMI 35.0-39.9) with comorbidity (HCC) 06/14/2019   Arthritis 03/14/2019   Essential hypertension 07/24/2018   Colon spasm 03/14/2017   Generalized anxiety disorder with panic  attacks 03/14/2017   DDD (degenerative disc disease), lumbosacral 01/12/2017   Fatty liver 01/12/2017   Seasonal allergic rhinitis due to pollen 01/12/2017   Urinary urgency 01/12/2017   Soft tissue mass 06/17/2014   Ganglion cyst of flexor tendon sheath of finger of right hand 06/17/2014   Decreased libido 09/20/2013   Other malaise and fatigue 09/20/2013   Sinusitis, acute maxillary 08/01/2013   Asthmatic bronchitis with exacerbation 01/19/2013   Skin tag 10/04/2012   Breast mass 12/17/2010   CANDIDIASIS 05/27/2010   ANXIETY STATE, UNSPECIFIED 03/30/2010   RESTLESS LEGS SYNDROME 03/30/2010   IBS 03/30/2010   ANEMIA-NOS 03/18/2010   Depression 03/18/2010   MIGRAINE HEADACHE 03/18/2010   GERD 03/18/2010   Radiculopathy, lumbosacral region 03/18/2010   Recurrent major depressive disorder, in full remission (HCC) 03/18/2010   Past Medical History:  Diagnosis Date   Anemia    Asthma    Depression    GERD (gastroesophageal reflux disease)    History of chicken pox    IBS (irritable bowel syndrome)    Kidney stones    Migraine    Urticaria    UTI (lower urinary tract infection)    Past Surgical History: Past Surgical History:  Procedure Laterality Date   ABDOMINAL HYSTERECTOMY     ADENOIDECTOMY     CESAREAN SECTION     TONSILLECTOMY     Medication List:  No current facility-administered medications for this visit.   No current outpatient medications on file.   Facility-Administered Medications Ordered in Other Visits  Medication Dose Route Frequency Provider Last Rate Last Admin   acetaminophen (TYLENOL) tablet 650 mg  650 mg Oral Q6H PRN Vassie Loll, MD       Or   acetaminophen (TYLENOL) suppository 650 mg  650 mg Rectal Q6H PRN Vassie Loll, MD       buPROPion (WELLBUTRIN XL) 24 hr tablet 300 mg  300 mg Oral Daily Adefeso, Oladapo, DO   300 mg at 12/31/22 0825   Chlorhexidine Gluconate Cloth 2 % PADS 6 each  6 each Topical Daily Vassie Loll, MD   6 each at  12/31/22 0826   LORazepam (ATIVAN) injection 0.5 mg  0.5 mg Intravenous Q4H PRN Vassie Loll, MD       pantoprazole (PROTONIX) EC tablet 40 mg  40 mg Oral Daily Adefeso, Oladapo, DO   40 mg at 12/31/22 0825   polyethylene glycol (MIRALAX / GLYCOLAX) packet 17 g  17 g Oral Daily PRN Vassie Loll, MD       Allergies: Allergies  Allergen Reactions   Latex  Swelling   Metoclopramide Nausea And Vomiting    Vomited for 8 hours    Alpha-Gal     Pork, beef, dairy, gelatin   Metoclopramide Hcl Nausea And Vomiting    Vomited for 8 hours   Tape Rash    Adhesive tape   Social History: Social History   Socioeconomic History   Marital status: Married    Spouse name: Not on file   Number of children: Not on file   Years of education: Not on file   Highest education level: Not on file  Occupational History   Not on file  Tobacco Use   Smoking status: Former    Current packs/day: 0.80    Types: Cigarettes   Smokeless tobacco: Never  Vaping Use   Vaping status: Never Used  Substance and Sexual Activity   Alcohol use: Yes    Comment: rarely   Drug use: Yes    Types: Marijuana    Comment: last smoked marijuana a couple of days ago   Sexual activity: Yes  Other Topics Concern   Not on file  Social History Narrative   Has a special needs daughter - preemie, had stroke.   Social Determinants of Health   Financial Resource Strain: Low Risk  (01/14/2021)   Received from [facility], [facility]   Overall Financial Resource Strain (CARDIA)    Difficulty of Paying Living Expenses: Not hard at all  Food Insecurity: No Food Insecurity (12/29/2022)   Hunger Vital Sign    Worried About Running Out of Food in the Last Year: Never true    Ran Out of Food in the Last Year: Never true  Transportation Needs: No Transportation Needs (12/29/2022)   PRAPARE - Administrator, Civil Service (Medical): No    Lack of Transportation (Non-Medical): No  Physical Activity: Sufficiently  Active (01/14/2021)   Received from [facility], [facility]   Exercise Vital Sign    Days of Exercise per Week: 5 days    Minutes of Exercise per Session: 50 min  Stress: Unknown (01/14/2021)   Received from [facility], [facility] - Occupational Stress Questionnaire    Feeling of Stress : Patient declined  Social Connections: Unknown (09/18/2021)   Received from [facility], [facility]   Social Network    Social Network: Not on file   Lives in a [facility] that is 50 year old, no roaches in the house and bed is 2 feet off the floor, no dust mite precuations on pillows, but is on bed, home is not near an interstate or industrial area . Smoking: no exposure  Occupation: [employer]  Environmental HistorySurveyor, minerals in the house: yes Carpet in the family room: yes Carpet in the bedroom: no Heating: gas Cooling: central Pet: yes 3 dogs, 1 cat, 2 birds, only cat has access to bedroom   Family History: Family History  Problem Relation Age of Onset   Coronary artery disease Mother    Hypertension Mother    Allergic rhinitis Father    Coronary artery disease Father    Heart attack Father    Allergic rhinitis Sister    Diabetes Maternal Grandfather    Hypertension Paternal Grandmother    Asthma Neg Hx    Eczema Neg Hx    Urticaria Neg Hx      ROS: All others negative except as noted per HPI.   Objective: BP 122/80 (BP Location: Right Arm, Patient  Position: Sitting, Cuff Size: Normal)   Pulse 76   Temp 98.1 F (36.7 C) (Temporal)   Resp 16   Ht 5' 2.1" (1.577 m)   Wt 184 lb 12.8 oz (83.8 kg)   SpO2 98%   BMI 33.69 kg/m  Body mass index is 33.69 kg/m.  General Appearance:  Alert, cooperative, no distress, appears stated age  Head:  Normocephalic, without obvious abnormality, atraumatic  Eyes:  Conjunctiva clear, EOM's intact  Nose: Nares normal, hypertrophic turbinates, normal mucosa, no visible anterior polyps, and  septum midline  Throat: Lips, tongue normal; teeth and gums normal, normal posterior oropharynx  Neck: Supple, symmetrical  Lungs:   clear to auscultation bilaterally, Respirations unlabored, no coughing  Heart:  regular rate and rhythm and no murmur, Appears well perfused  Extremities: No edema  Skin: Skin color, texture, turgor normal, no rashes or lesions on visualized portions of skin  Neurologic: No gross deficits   The plan was reviewed with the patient/family, and all questions/concerned were addressed.  It was my pleasure to see Beverly Elliott today and participate in her care. Please feel free to contact me with any questions or concerns.  Sincerely,  Ferol Luz, MD Allergy & Immunology  [facility] office: [phone] [facility] Main office: [phone]

## 2022-12-29 ENCOUNTER — Emergency Department (HOSPITAL_COMMUNITY): Payer: BC Managed Care – PPO

## 2022-12-29 ENCOUNTER — Other Ambulatory Visit: Payer: Self-pay

## 2022-12-29 ENCOUNTER — Observation Stay (HOSPITAL_COMMUNITY)
Admission: EM | Admit: 2022-12-29 | Discharge: 2022-12-31 | Disposition: A | Discharge status: Home / Self Care | Payer: BC Managed Care – PPO | Source: Home / Self Care | Attending: Emergency Medicine | Admitting: Emergency Medicine

## 2022-12-29 ENCOUNTER — Encounter (HOSPITAL_COMMUNITY): Payer: Self-pay

## 2022-12-29 DIAGNOSIS — Z87891 Personal history of nicotine dependence: Secondary | ICD-10-CM | POA: Insufficient documentation

## 2022-12-29 DIAGNOSIS — Z6833 Body mass index (BMI) 33.0-33.9, adult: Secondary | ICD-10-CM | POA: Insufficient documentation

## 2022-12-29 DIAGNOSIS — Z91040 Latex allergy status: Secondary | ICD-10-CM | POA: Insufficient documentation

## 2022-12-29 DIAGNOSIS — E876 Hypokalemia: Principal | ICD-10-CM | POA: Diagnosis present

## 2022-12-29 DIAGNOSIS — E669 Obesity, unspecified: Secondary | ICD-10-CM | POA: Diagnosis not present

## 2022-12-29 DIAGNOSIS — J45909 Unspecified asthma, uncomplicated: Secondary | ICD-10-CM | POA: Diagnosis not present

## 2022-12-29 DIAGNOSIS — I10 Essential (primary) hypertension: Secondary | ICD-10-CM | POA: Diagnosis present

## 2022-12-29 DIAGNOSIS — R1115 Cyclical vomiting syndrome unrelated to migraine: Secondary | ICD-10-CM | POA: Diagnosis present

## 2022-12-29 DIAGNOSIS — E119 Type 2 diabetes mellitus without complications: Secondary | ICD-10-CM | POA: Diagnosis not present

## 2022-12-29 DIAGNOSIS — Z79899 Other long term (current) drug therapy: Secondary | ICD-10-CM | POA: Diagnosis not present

## 2022-12-29 DIAGNOSIS — N179 Acute kidney failure, unspecified: Secondary | ICD-10-CM | POA: Diagnosis not present

## 2022-12-29 DIAGNOSIS — R1030 Lower abdominal pain, unspecified: Secondary | ICD-10-CM | POA: Diagnosis present

## 2022-12-29 DIAGNOSIS — R112 Nausea with vomiting, unspecified: Principal | ICD-10-CM

## 2022-12-29 LAB — CBC WITH DIFFERENTIAL/PLATELET
Abs Immature Granulocytes: 0.1 10*3/uL — ABNORMAL HIGH (ref 0.00–0.07)
Basophils Absolute: 0 10*3/uL (ref 0.0–0.1)
Basophils Relative: 0 %
Eosinophils Absolute: 0 10*3/uL (ref 0.0–0.5)
Eosinophils Relative: 0 %
HCT: 44.9 % (ref 36.0–46.0)
Hemoglobin: 15.7 g/dL — ABNORMAL HIGH (ref 12.0–15.0)
Immature Granulocytes: 1 %
Lymphocytes Relative: 15 %
Lymphs Abs: 2.5 10*3/uL (ref 0.7–4.0)
MCH: 29.8 pg (ref 26.0–34.0)
MCHC: 35 g/dL (ref 30.0–36.0)
MCV: 85.2 fL (ref 80.0–100.0)
Monocytes Absolute: 1.3 10*3/uL — ABNORMAL HIGH (ref 0.1–1.0)
Monocytes Relative: 8 %
Neutro Abs: 12.6 10*3/uL — ABNORMAL HIGH (ref 1.7–7.7)
Neutrophils Relative %: 76 %
Platelets: 345 10*3/uL (ref 150–400)
RBC: 5.27 MIL/uL — ABNORMAL HIGH (ref 3.87–5.11)
RDW: 13.6 % (ref 11.5–15.5)
WBC: 16.6 10*3/uL — ABNORMAL HIGH (ref 4.0–10.5)
nRBC: 0 % (ref 0.0–0.2)

## 2022-12-29 LAB — URINALYSIS, ROUTINE W REFLEX MICROSCOPIC
Bilirubin Urine: NEGATIVE
Glucose, UA: NEGATIVE mg/dL
Ketones, ur: 5 mg/dL — AB
Leukocytes,Ua: NEGATIVE
Nitrite: NEGATIVE
Protein, ur: 100 mg/dL — AB
Specific Gravity, Urine: 1.025 (ref 1.005–1.030)
pH: 5 (ref 5.0–8.0)

## 2022-12-29 LAB — COMPREHENSIVE METABOLIC PANEL
ALT: 17 U/L (ref 0–44)
AST: 19 U/L (ref 15–41)
Albumin: 4.3 g/dL (ref 3.5–5.0)
Alkaline Phosphatase: 64 U/L (ref 38–126)
Anion gap: 14 (ref 5–15)
BUN: 27 mg/dL — ABNORMAL HIGH (ref 6–20)
CO2: 30 mmol/L (ref 22–32)
Calcium: 9.2 mg/dL (ref 8.9–10.3)
Chloride: 96 mmol/L — ABNORMAL LOW (ref 98–111)
Creatinine, Ser: 1.53 mg/dL — ABNORMAL HIGH (ref 0.44–1.00)
GFR, Estimated: 41 mL/min — ABNORMAL LOW (ref >60)
Glucose, Bld: 112 mg/dL — ABNORMAL HIGH (ref 70–99)
Potassium: 2 mmol/L — CL (ref 3.5–5.1)
Sodium: 140 mmol/L (ref 135–145)
Total Bilirubin: 0.9 mg/dL (ref 0.3–1.2)
Total Protein: 7.4 g/dL (ref 6.5–8.1)

## 2022-12-29 LAB — MAGNESIUM: Magnesium: 1.9 mg/dL (ref 1.7–2.4)

## 2022-12-29 LAB — LIPASE, BLOOD: Lipase: 27 U/L (ref 11–51)

## 2022-12-29 MED ORDER — SODIUM CHLORIDE 0.9 % IV BOLUS
1000.0000 mL | Freq: Once | INTRAVENOUS | Status: AC
  Administered 2022-12-29: 1000 mL via INTRAVENOUS

## 2022-12-29 MED ORDER — POTASSIUM CHLORIDE 10 MEQ/100ML IV SOLN
10.0000 meq | INTRAVENOUS | Status: AC
  Administered 2022-12-30 (×3): 10 meq via INTRAVENOUS
  Filled 2022-12-29 (×3): qty 100

## 2022-12-29 MED ORDER — LORAZEPAM 2 MG/ML IJ SOLN
0.5000 mg | Freq: Once | INTRAMUSCULAR | Status: AC
  Administered 2022-12-29: 0.5 mg via INTRAVENOUS
  Filled 2022-12-29: qty 1

## 2022-12-29 MED ORDER — POTASSIUM CHLORIDE 10 MEQ/100ML IV SOLN
10.0000 meq | INTRAVENOUS | Status: AC
  Administered 2022-12-29 – 2022-12-30 (×3): 10 meq via INTRAVENOUS
  Filled 2022-12-29 (×3): qty 100

## 2022-12-29 MED ORDER — POTASSIUM CHLORIDE 10 MEQ/100ML IV SOLN: 10.0000 meq | INTRAVENOUS | Status: DC

## 2022-12-29 MED ORDER — LORAZEPAM 2 MG/ML IJ SOLN
1.0000 mg | Freq: Once | INTRAMUSCULAR | Status: AC
  Administered 2022-12-29: 1 mg via INTRAVENOUS
  Filled 2022-12-29: qty 1

## 2022-12-29 MED ORDER — POTASSIUM CHLORIDE IN NACL 40-0.9 MEQ/L-% IV SOLN
INTRAVENOUS | Status: AC
  Filled 2022-12-29 (×2): qty 1000

## 2022-12-29 NOTE — ED Triage Notes (Signed)
Pt reports "non stop vomiting" since yesterday, states she has alpha gal and believes she is having a reaction to a "cross-contaminated" meal yesterday

## 2022-12-29 NOTE — Assessment & Plan Note (Addendum)
-  In the setting of GI losses -Improved/resolved at time of discharge -Repeat basic metabolic panel at follow-up visit to assess stability. -No telemetry abnormalities.

## 2022-12-29 NOTE — H&P (Signed)
History and Physical    Jamiela Blumenschein ZOX:[id number] DOB: July 22, 1970 DOA: 12/29/2022  PCP: Trisha Mangle, FNP   Patient coming from: Home  I have personally briefly reviewed patient's old medical records in [facility]  Chief Complaint: Abdominal Pain, vomiting  HPI: Beverly Elliott is a 52 y.o. female with medical history significant for diabetes mellitus, OSA, anxiety, depression, hypertension. Patient presented to the ED with complaints of vomiting and lower abdominal pain over the past 2 days.  She reports she was driving, stopped at a restaurant, and believes she ate food that was cross-contaminated with alpha gal.  She has alpha gal syndrome, and the symptoms typically present like this.  She reports multiple episodes of vomiting since onset. Patient reports Zofran, Phenergan, Reglan did not help with her vomiting.  Only Ativan helps.  ED Course: Tmax 99.9.  Heart rate 83-116.  Respirate rate 11-22.  Blood pressure systolic 116-150.  O2 sats greater than 92% on room air. WBC 16.6.  Potassium 2.  Creatinine 1.5. 2 L bolus given.  Potassium supplementation started.  Ativan 0.5mg  and then 1 mg given.  Review of Systems: As per HPI all other systems reviewed and negative.  Past Medical History:  Diagnosis Date   Anemia    Asthma    Depression    GERD (gastroesophageal reflux disease)    History of chicken pox    IBS (irritable bowel syndrome)    Kidney stones    Migraine    Urticaria    UTI (lower urinary tract infection)     Past Surgical History:  Procedure Laterality Date   ABDOMINAL HYSTERECTOMY     ADENOIDECTOMY     CESAREAN SECTION     TONSILLECTOMY       reports that she has quit smoking. Her smoking use included cigarettes. She has never used smokeless tobacco. She reports current alcohol use. She reports current drug use. Drug: Marijuana.  Allergies  Allergen Reactions   Latex Swelling   Metoclopramide Nausea And Vomiting    Vomited for 8  hours    Alpha-Gal     Pork, beef, dairy, gelatin   Metoclopramide Hcl Nausea And Vomiting    Vomited for 8 hours   Tape Rash    Adhesive tape    Family History  Problem Relation Age of Onset   Coronary artery disease Mother    Hypertension Mother    Allergic rhinitis Father    Coronary artery disease Father    Heart attack Father    Allergic rhinitis Sister    Diabetes Maternal Grandfather    Hypertension Paternal Grandmother    Asthma Neg Hx    Eczema Neg Hx    Urticaria Neg Hx     Prior to Admission medications   Medication Sig Start Date End Date Taking? Authorizing Provider  amLODipine (NORVASC) 10 MG tablet Take 1 tablet (10 mg total) by mouth at bedtime. 01/13/22   Shon Hale, MD  buPROPion (WELLBUTRIN XL) 300 MG 24 hr tablet Take 300 mg by mouth daily. 09/07/21   [provider]  cetirizine (ZYRTEC) 10 MG tablet Take 1 tablet (10 mg total) by mouth daily. 12/24/22   Ferol Luz, MD  EPINEPHrine 0.3 mg/0.3 mL IJ SOAJ injection Inject 0.3 mg into the muscle as needed for anaphylaxis. 12/24/22   Ferol Luz, MD  escitalopram (LEXAPRO) 20 MG tablet Take 20 mg by mouth at bedtime. 05/08/21   [provider]  fluticasone (FLONASE) 50 MCG/ACT nasal spray Place  1 spray into both nostrils 2 (two) times daily. 12/24/22   Ferol Luz, MD  hydrochlorothiazide (HYDRODIURIL) 25 MG tablet Take 0.5 tablets (12.5 mg total) by mouth daily. Patient taking differently: Take 25 mg by mouth daily. 01/13/22   Shon Hale, MD    Physical Exam: Vitals:   12/29/22 2045 12/29/22 2100 12/29/22 2115 12/29/22 2130  BP:  125/84  118/82  Pulse: 90 85  82  Resp: 14 11  16   Temp:   98.4 F (36.9 C)   TempSrc:   Oral   SpO2: 99% 96%  93%  Weight:      Height:        Constitutional: NAD, calm, comfortable Vitals:   12/29/22 2045 12/29/22 2100 12/29/22 2115 12/29/22 2130  BP:  125/84  118/82  Pulse: 90 85  82  Resp: 14 11  16   Temp:   98.4 F (36.9 C)    TempSrc:   Oral   SpO2: 99% 96%  93%  Weight:      Height:       Eyes: PERRL, lids and conjunctivae normal ENMT: Mucous membranes are moist.  Neck: normal, supple, no masses, no thyromegaly Respiratory: clear to auscultation bilaterally, no wheezing, no crackles. Normal respiratory effort. No accessory muscle use.  Cardiovascular: Regular rate and rhythm, no murmurs / rubs / gallops. No extremity edema. 2+ pedal pulses. No carotid bruits.  Abdomen: mild Diffuse abdominal tenderness , no masses palpated. No hepatosplenomegaly.   Musculoskeletal: no clubbing / cyanosis. No joint deformity upper and lower extremities. Good ROM, no contractures. Normal muscle tone.  Skin: no rashes, lesions, ulcers. No induration Neurologic: No facial symmetry, moving extremities spontaneously.Marland Kitchen  Psychiatric: Normal judgment and insight. Alert and oriented x 3. Normal mood.   Labs on Admission: I have personally reviewed following labs and imaging studies  CBC: Recent Labs  Lab 12/29/22 1838  WBC 16.6*  NEUTROABS 12.6*  HGB 15.7*  HCT 44.9  MCV 85.2  PLT 345   Basic Metabolic Panel: Recent Labs  Lab 12/29/22 1922  NA 140  K 2.0*  CL 96*  CO2 30  GLUCOSE 112*  BUN 27*  CREATININE 1.53*  CALCIUM 9.2  MG 1.9   GFR: Estimated Creatinine Clearance: 43.1 mL/min (A) (by C-G formula based on SCr of 1.53 mg/dL (H)). Liver Function Tests: Recent Labs  Lab 12/29/22 1922  AST 19  ALT 17  ALKPHOS 64  BILITOT 0.9  PROT 7.4  ALBUMIN 4.3   Recent Labs  Lab 12/29/22 1922  LIPASE 27   Urine analysis:    Component Value Date/Time   COLORURINE AMBER (A) 12/29/2022 1840   APPEARANCEUR CLOUDY (A) 12/29/2022 1840   LABSPEC 1.025 12/29/2022 1840   PHURINE 5.0 12/29/2022 1840   GLUCOSEU NEGATIVE 12/29/2022 1840   HGBUR SMALL (A) 12/29/2022 1840   BILIRUBINUR NEGATIVE 12/29/2022 1840   KETONESUR 5 (A) 12/29/2022 1840   PROTEINUR 100 (A) 12/29/2022 1840   UROBILINOGEN 0.2 09/03/2013 1353    NITRITE NEGATIVE 12/29/2022 1840   LEUKOCYTESUR NEGATIVE 12/29/2022 1840    Radiological Exams on Admission: No results found.  EKG: Independently reviewed.  Sinus rhythm, rate 82, QTc prolonged at 554.  Slight diffuse ST depressions more pronounced compared to prior EKGs.  Assessment/Plan Principal Problem:   Hypokalemia Active Problems:   Cyclical vomiting with nausea   AKI (acute kidney injury) (HCC)   Essential hypertension  Assessment and Plan: * Hypokalemia Potassium 2.  Magnesium normal at 1.9.  Secondary to  persistent intractable vomiting.   -Replete K intravenously -Unable to tolerate oral intake -Give 1g mag  AKI (acute kidney injury) (HCC) Mild AKI. Creatinine elevated 1.5, baseline about 1.1. - Hydrate  Cyclical vomiting with nausea Vomiting with abdominal tenderness.  No diarrhea.  Tmax 99.9.  Leukocytosis of 16.6.  Multiple episodes of vomiting over the past 2 days.  Reports alpha gal syndrome is likely etiology has had similar episodes in the past.  No abdominal imaging on file.  Differentials include food poisoning, enteritis versus alpha gal allergy syndrome. -Reports Zofran, Phenergan, Reglan does not help, has also tried Compazine in the past.  Only Ativan helps. -IV Ativan 0.5 every 4 hourly as needed - UDS -N.p.o., allow for bowel rest - N/s + 40 kcl 100cc/hr  Essential hypertension Norvasc and HCTZ on med list, patient denies taking any medications except for Xolair for her alpha-gal allergy syndrome.     DVT prophylaxis: SCDS for now (Cross allergy to heparin and Lovenox, and unable to tolerate oral intake) Code Status: FULL code Family Communication: None at bedside Disposition Plan: ~ 1 - 2days Consults called: None Admission status:  Obs tele     Author: Onnie Boer, MD 12/29/2022 11:22 PM  For on call review [email or web address].

## 2022-12-29 NOTE — Assessment & Plan Note (Signed)
Vomiting with abdominal tenderness.  No diarrhea.  Tmax 99.9.  Leukocytosis of 16.6.  Multiple episodes of vomiting over the past 2 days.  Reports alpha gal syndrome is likely etiology has had similar episodes in the past.  No abdominal imaging on file. -Reports Zofran, Phenergan, Reglan does not help, has also tried Compazine in the past.  Only Ativan helps. -IV Ativan 0.5 every 4 hourly as needed - UDS -N.p.o., allow for bowel rest - N/s + 40 kcl 100cc/hr

## 2022-12-29 NOTE — ED Provider Notes (Signed)
   Patient signed out to me by Burgess Amor, PA-C pending completion of workup.  Patient here for persistent vomiting x 2 days.  She notes history of alpha gal and believes she ate food that was "cross contaminated."  Also feels like her potassium is low.  Has history of same.  See previous provider note for full H&P  Patient has received IV fluids and IV Ativan prior to my evaluation.  She reports feeling some better but continuing to have nausea.  Her labs show significant hypokalemia with potassium of 2.  Some likely dehydration as well as she has BUN of 27 and serum creatinine is 1.5.  This is mildly elevated from 3 weeks ago when she was seen with similar symptoms.  Lipase and magnesium levels are unremarkable.  White count is elevated at 16,000, this is felt to be secondary to her persistent vomiting.   Given patient's significant hypokalemia and prolonged QT which is felt to be longer than EKG interpretation.  Recommended hospital admission  Discussed findings with [facility] hospitalist, Dr. Mariea Clonts who agrees to admit    EKG Interpretation Date/Time:  Wednesday December 29 2022 21:19:15 EDT Ventricular Rate:  82 PR Interval:  145 QRS Duration:  92 QT Interval:  474 QTC Calculation: 554 R Axis:   85  Text Interpretation: Sinus rhythm Right atrial enlargement Nonspecific repol abnormality, diffuse leads Prolonged QT interval Confirmed by Eber Hong ([id number]) on 12/29/2022 9:43:37 PM          Labs Reviewed  CBC WITH DIFFERENTIAL/PLATELET - Abnormal; Notable for the following components:      Result Value   WBC 16.6 (*)    RBC 5.27 (*)    Hemoglobin 15.7 (*)    Neutro Abs 12.6 (*)    Monocytes Absolute 1.3 (*)    Abs Immature Granulocytes 0.10 (*)    All other components within normal limits  URINALYSIS, ROUTINE W REFLEX MICROSCOPIC - Abnormal; Notable for the following components:   Color, Urine AMBER (*)    APPearance CLOUDY (*)    Hgb urine dipstick SMALL (*)    Ketones,  ur 5 (*)    Protein, ur 100 (*)    Bacteria, UA RARE (*)    All other components within normal limits  COMPREHENSIVE METABOLIC PANEL - Abnormal; Notable for the following components:   Potassium 2.0 (*)    Chloride 96 (*)    Glucose, Bld 112 (*)    BUN 27 (*)    Creatinine, Ser 1.53 (*)    GFR, Estimated 41 (*)    All other components within normal limits  LIPASE, BLOOD  MAGNESIUM       Pauline Aus, PA-C 12/29/22 2225    Eber Hong, MD 12/30/22 1553

## 2022-12-29 NOTE — Assessment & Plan Note (Addendum)
-  Blood pressure overall stable -Will continue monitoring vital signs and focusing on heart healthy/low-sodium diet. -Patient reports not taking any antihypertensive agents currently. -Reassess blood pressure at follow-up visit and further determine the need of antihypertensive agents.

## 2022-12-29 NOTE — ED Notes (Signed)
Pt to room from CT  

## 2022-12-29 NOTE — Assessment & Plan Note (Addendum)
-  Acute kidney injury in the setting of prerenal azotemia and dehydration from GI losses. -Resolved and creatinine within normal range at discharge. -Patient advised to maintain adequate hydration.

## 2022-12-29 NOTE — Assessment & Plan Note (Addendum)
-  Vomiting with abdominal tenderness.  No diarrhea.  Tmax 99.9.  Leukocytosis of 16.6.   -Multiple episodes of vomiting over the past 2 days prior to admission.  Reports alpha gal syndrome is likely etiology has had similar episodes in the past.   -Patient's nausea and vomiting resolved.  Tolerating diet. -Feeling ready to go home. -WBCs and electrolytes has been repleted/stabilized and back to normal. -Patient advised to maintain adequate hydration. -Continue as needed antiemetics and follow electrolytes trend at follow-up visit with repeat basic metabolic panel.

## 2022-12-29 NOTE — ED Provider Notes (Signed)
[place] EMERGENCY DEPARTMENT AT [facility] Provider Note   CSN: [id number] Arrival date & time: 12/29/22  1648     History  Chief Complaint  Patient presents with   Emesis    [place] Prindiville is a 52 y.o. female with a history significant for alpha gal hypersensitivity, IBS, GERD, asthma, anemia and migraines, presenting with uncontrolled nausea and vomiting along with epigastric discomfort prior to emesis.  She was traveling 2 nights ago and stopped for a meal and suspects her food was cross contaminated with chicken which was also being prepared.  She woke hours later with uncontrolled nausea and vomiting, stating she was vomiting every 15 minutes throughout the last 24 hours.  She has no fevers, she denies abdominal pain except for cramping prior to vomiting, no diarrhea.  Denies abdominal distention.  She has had reduced urinary output and is concerned for significant dehydration.  She states her mouth feels dry and she feels generally weak and fatigued.  She states that antiemetics are not helpful for her although sometimes Unisom is helpful, she tried this without any effect.  She typically gets IV fluids and a benzodiazepine which treats her symptoms.  The history is provided by the patient.       Home Medications Prior to Admission medications   Medication Sig Start Date End Date Taking? Authorizing Provider  amLODipine (NORVASC) 10 MG tablet Take 1 tablet (10 mg total) by mouth at bedtime. 01/13/22   Shon Hale, MD  buPROPion (WELLBUTRIN XL) 300 MG 24 hr tablet Take 300 mg by mouth daily. 09/07/21   [provider]  cetirizine (ZYRTEC) 10 MG tablet Take 1 tablet (10 mg total) by mouth daily. 12/24/22   Ferol Luz, MD  EPINEPHrine 0.3 mg/0.3 mL IJ SOAJ injection Inject 0.3 mg into the muscle as needed for anaphylaxis. 12/24/22   Ferol Luz, MD  escitalopram (LEXAPRO) 20 MG tablet Take 20 mg by mouth at bedtime. 05/08/21   [provider]   fluticasone (FLONASE) 50 MCG/ACT nasal spray Place 1 spray into both nostrils 2 (two) times daily. 12/24/22   Ferol Luz, MD  hydrochlorothiazide (HYDRODIURIL) 25 MG tablet Take 0.5 tablets (12.5 mg total) by mouth daily. Patient taking differently: Take 25 mg by mouth daily. 01/13/22   Shon Hale, MD      Allergies    Latex, Metoclopramide, Alpha-gal, Metoclopramide hcl, and Tape    Review of Systems   Review of Systems  Constitutional:  Positive for fatigue. Negative for fever.  HENT:  Negative for congestion and sore throat.   Eyes: Negative.   Respiratory:  Negative for chest tightness and shortness of breath.   Cardiovascular:  Negative for chest pain.  Gastrointestinal:  Positive for nausea and vomiting. Negative for abdominal pain.  Genitourinary: Negative.   Musculoskeletal:  Negative for arthralgias, joint swelling and neck pain.  Skin: Negative.  Negative for rash and wound.  Neurological:  Positive for weakness. Negative for dizziness, light-headedness, numbness and headaches.  Psychiatric/Behavioral: Negative.    All other systems reviewed and are negative.   Physical Exam Updated Vital Signs BP (!) 150/106 (BP Location: Right Arm)   Pulse (!) 116   Temp 99.9 F (37.7 C) (Oral)   Resp 20   Ht 5\' 2"  (1.575 m)   Wt 79.8 kg   SpO2 100%   BMI 32.19 kg/m  Physical Exam Vitals and nursing note reviewed.  Constitutional:      Appearance: She is well-developed.  HENT:  Head: Normocephalic and atraumatic.     Mouth/Throat:     Mouth: Mucous membranes are dry.  Eyes:     Conjunctiva/sclera: Conjunctivae normal.  Cardiovascular:     Rate and Rhythm: Regular rhythm. Tachycardia present.     Heart sounds: Normal heart sounds.  Pulmonary:     Effort: Pulmonary effort is normal.     Breath sounds: Normal breath sounds. No wheezing.  Abdominal:     General: Abdomen is protuberant. Bowel sounds are increased. There is no distension.     Palpations:  Abdomen is soft.     Tenderness: There is no abdominal tenderness. There is no guarding or rebound.  Musculoskeletal:        General: Normal range of motion.     Cervical back: Normal range of motion.  Skin:    General: Skin is warm and dry.  Neurological:     Mental Status: She is alert.     ED Results / Procedures / Treatments   Labs (all labs ordered are listed, but only abnormal results are displayed) Labs Reviewed  CBC WITH DIFFERENTIAL/PLATELET  COMPREHENSIVE METABOLIC PANEL  LIPASE, BLOOD  URINALYSIS, ROUTINE W REFLEX MICROSCOPIC    EKG None  Radiology No results found.  Procedures Procedures    Medications Ordered in ED Medications  sodium chloride 0.9 % bolus 1,000 mL (has no administration in time range)  LORazepam (ATIVAN) injection 1 mg (has no administration in time range)    ED Course/ Medical Decision Making/ A&P                                 Medical Decision Making Patient presenting with nausea and vomiting after suspicion of contaminated meat in her meal 2 nights ago with persistent symptoms since, suspicious for triggering her alpha gal sensitivity.  She has no abdominal pain, endorses nausea, dehydration and generalized fatigue.  Labs have been ordered, IV fluids along with a dose of Ativan which historically has helped to relieve her symptoms.  I suspect patient will be discharged to home assuming labs are reassuring and she has been rehydrated.  Tammy Triplett, PA-C assumes care.  Amount and/or Complexity of Data Reviewed Labs: ordered.           Final Clinical Impression(s) / ED Diagnoses Final diagnoses:  Nausea and vomiting, unspecified vomiting type    Rx / DC Orders ED Discharge Orders     None         Victoriano Lain 12/29/22 1836    Eber Hong, MD 12/30/22 [phone]

## 2022-12-29 NOTE — ED Notes (Signed)
Patient transported to CT 

## 2022-12-30 DIAGNOSIS — E876 Hypokalemia: Secondary | ICD-10-CM | POA: Diagnosis not present

## 2022-12-30 DIAGNOSIS — R1115 Cyclical vomiting syndrome unrelated to migraine: Secondary | ICD-10-CM | POA: Diagnosis not present

## 2022-12-30 DIAGNOSIS — I10 Essential (primary) hypertension: Secondary | ICD-10-CM | POA: Diagnosis not present

## 2022-12-30 DIAGNOSIS — N179 Acute kidney failure, unspecified: Secondary | ICD-10-CM | POA: Diagnosis not present

## 2022-12-30 LAB — BASIC METABOLIC PANEL
Anion gap: 10 (ref 5–15)
BUN: 22 mg/dL — ABNORMAL HIGH (ref 6–20)
CO2: 28 mmol/L (ref 22–32)
Calcium: 8.3 mg/dL — ABNORMAL LOW (ref 8.9–10.3)
Chloride: 101 mmol/L (ref 98–111)
Creatinine, Ser: 1.21 mg/dL — ABNORMAL HIGH (ref 0.44–1.00)
GFR, Estimated: 55 mL/min — ABNORMAL LOW (ref >60)
Glucose, Bld: 96 mg/dL (ref 70–99)
Potassium: 2.6 mmol/L — CL (ref 3.5–5.1)
Sodium: 139 mmol/L (ref 135–145)

## 2022-12-30 LAB — CBC
HCT: 38 % (ref 36.0–46.0)
Hemoglobin: 12.6 g/dL (ref 12.0–15.0)
MCH: 29.5 pg (ref 26.0–34.0)
MCHC: 33.2 g/dL (ref 30.0–36.0)
MCV: 89 fL (ref 80.0–100.0)
Platelets: 248 10*3/uL (ref 150–400)
RBC: 4.27 MIL/uL (ref 3.87–5.11)
RDW: 14.3 % (ref 11.5–15.5)
WBC: 12.3 10*3/uL — ABNORMAL HIGH (ref 4.0–10.5)
nRBC: 0 % (ref 0.0–0.2)

## 2022-12-30 LAB — RAPID URINE DRUG SCREEN, HOSP PERFORMED
Amphetamines: NOT DETECTED
Barbiturates: NOT DETECTED
Benzodiazepines: NOT DETECTED
Cocaine: NOT DETECTED
Opiates: NOT DETECTED
Tetrahydrocannabinol: POSITIVE — AB

## 2022-12-30 LAB — HIV ANTIBODY (ROUTINE TESTING W REFLEX): HIV Screen 4th Generation wRfx: NONREACTIVE

## 2022-12-30 LAB — MRSA NEXT GEN BY PCR, NASAL: MRSA by PCR Next Gen: NOT DETECTED

## 2022-12-30 MED ORDER — POTASSIUM CHLORIDE CRYS ER 20 MEQ PO TBCR
40.0000 meq | EXTENDED_RELEASE_TABLET | ORAL | Status: AC
  Administered 2022-12-30 (×2): 40 meq via ORAL
  Filled 2022-12-30 (×2): qty 2

## 2022-12-30 MED ORDER — CHLORHEXIDINE GLUCONATE CLOTH 2 % EX PADS
6.0000 | MEDICATED_PAD | Freq: Every day | CUTANEOUS | Status: DC
  Administered 2022-12-30 – 2022-12-31 (×2): 6 via TOPICAL

## 2022-12-30 MED ORDER — ACETAMINOPHEN 325 MG PO TABS: 650.0000 mg | ORAL_TABLET | Freq: Four times a day (QID) | ORAL | Status: DC | PRN

## 2022-12-30 MED ORDER — ACETAMINOPHEN 650 MG RE SUPP: 650.0000 mg | Freq: Four times a day (QID) | RECTAL | Status: DC | PRN

## 2022-12-30 MED ORDER — PANTOPRAZOLE SODIUM 40 MG PO TBEC
40.0000 mg | DELAYED_RELEASE_TABLET | Freq: Every day | ORAL | Status: DC
  Administered 2022-12-30 – 2022-12-31 (×2): 40 mg via ORAL
  Filled 2022-12-30 (×2): qty 1

## 2022-12-30 MED ORDER — MAGNESIUM SULFATE IN D5W 1-5 GM/100ML-% IV SOLN
1.0000 g | Freq: Once | INTRAVENOUS | Status: AC
  Administered 2022-12-30: 1 g via INTRAVENOUS
  Filled 2022-12-30: qty 100

## 2022-12-30 MED ORDER — POLYETHYLENE GLYCOL 3350 17 G PO PACK: 17.0000 g | PACK | Freq: Every day | ORAL | Status: DC | PRN

## 2022-12-30 MED ORDER — POTASSIUM CHLORIDE 10 MEQ/100ML IV SOLN
10.0000 meq | INTRAVENOUS | Status: AC
  Administered 2022-12-30 (×4): 10 meq via INTRAVENOUS
  Filled 2022-12-30 (×4): qty 100

## 2022-12-30 MED ORDER — LORAZEPAM 2 MG/ML IJ SOLN: 0.5000 mg | INTRAMUSCULAR | Status: DC | PRN

## 2022-12-30 MED ORDER — BUPROPION HCL ER (XL) 300 MG PO TB24
300.0000 mg | ORAL_TABLET | Freq: Every day | ORAL | Status: DC
  Administered 2022-12-30 – 2022-12-31 (×2): 300 mg via ORAL
  Filled 2022-12-30 (×2): qty 1

## 2022-12-30 NOTE — Progress Notes (Signed)
   12/30/22 1318  TOC Brief Assessment  Insurance and Status Reviewed  Patient has primary care physician Yes  Home environment has been reviewed spouse and children  Prior level of function: independent  Prior/Current Home Services No current home services  Social Determinants of Health Reivew SDOH reviewed no interventions necessary  Readmission risk has been reviewed Yes  Transition of care needs no transition of care needs at this time   Transition of Care Department [facility]) has reviewed patient and no TOC needs have been identified at this time. We will continue to monitor patient advancement through interdisciplinary progression rounds. If new patient transition needs arise, please place a TOC consult.

## 2022-12-30 NOTE — Progress Notes (Signed)
   12/30/22 0630  Provider Notification  Provider Name/Title Dr. Thomes Dinning  Date Provider Notified 12/30/22  Time Provider Notified 0630  Method of Notification Page  Notification Reason Critical Result  Test performed and critical result Potassium 2.6

## 2022-12-30 NOTE — Progress Notes (Signed)
Progress Note   Patient: Beverly Elliott ZOX:[id number] DOB: 03/19/1971 DOA: 12/29/2022     0 DOS: the patient was seen and examined on 12/30/2022   Brief hospital admission narrative: As per H&P written by Dr. Mariea Clonts on 12/29/2022 Beverly Elliott is a 52 y.o. female with medical history significant for diabetes mellitus, OSA, anxiety, depression, hypertension. Patient presented to the ED with complaints of vomiting and lower abdominal pain over the past 2 days.  She reports she was driving, stopped at a restaurant, and believes she ate food that was cross-contaminated with alpha gal.  She has alpha gal syndrome, and the symptoms typically present like this.  She reports multiple episodes of vomiting since onset. Patient reports Zofran, Phenergan, Reglan did not help with her vomiting.  Only Ativan helps.   ED Course: Tmax 99.9.  Heart rate 83-116.  Respirate rate 11-22.  Blood pressure systolic 116-150.  O2 sats greater than 92% on room air. WBC 16.6.  Potassium 2.  Creatinine 1.5. 2 L bolus given.  Potassium supplementation started.  Ativan 0.5mg  and then 1 mg given.  Assessment and Plan: * Hypokalemia -In the setting of GI losses -Improved to 2.6; continue repletion and follow trend/stability.  AKI (acute kidney injury) (HCC) -Acute kidney injury in the setting of prerenal azotemia and dehydration from GI losses. -Continue fluid resuscitation and electrolyte repletion. -Continue to follow renal function trend -Continue to maintain adequate hydration with the use of nephrotoxic agents.  Cyclical vomiting with nausea -Vomiting with abdominal tenderness.  No diarrhea.  Tmax 99.9.  Leukocytosis of 16.6.   -Multiple episodes of vomiting over the past 2 days prior to admission.  Reports alpha gal syndrome is likely etiology has had similar episodes in the past.   -Patient nausea and vomiting has finally improved/subsided. -Feeling ready to have diet advance -Continue as needed antiemetics  and follow electrolytes trend and further repletion as required. -Continue to maintain adequate hydration.  Essential hypertension -Blood pressure overall stable -Will continue monitoring vital signs and focusing on heart healthy/low-sodium diet. -Patient reports not taking any antihypertensive agents currently.   Subjective:  Afebrile, no chest pain, reports no further episode of nausea vomiting.  Feels ready to have diet advance.  Physical Exam: Vitals:   12/30/22 0600 12/30/22 0749 12/30/22 1128 12/30/22 1415  BP: (!) 96/46   127/86  Pulse: 72   70  Resp: 19   18  Temp:  97.9 F (36.6 C) 97.7 F (36.5 C) 98.1 F (36.7 C)  TempSrc:  Oral Oral Oral  SpO2: 93%   98%  Weight:      Height:       General exam: Alert, awake, oriented x 3; no chest pain, no fever, no shortness of breath.  Patient reports no further episode of nausea and vomiting and feels ready to have diet advance. Respiratory system: Clear to auscultation. Respiratory effort normal.  Good saturation on room air; no using accessory muscle. Cardiovascular system:RRR. No rubs or gallops. Gastrointestinal system: Abdomen is nondistended, soft and nontender. No organomegaly or masses felt. Normal bowel sounds heard. Central nervous system: Alert and oriented. No focal neurological deficits. Extremities: No cyanosis or clubbing. Skin: No petechiae. Psychiatry: Judgement and insight appear normal. Mood & affect appropriate.    Data Reviewed: Basic metabolic panel: Sodium 139, potassium 2.6, chloride 101, bicarb 28, glucose 96, BUN 22, creatinine 1.21 and GFR 55 CBC: White blood cell 12.3, hemoglobin 12.6 and platelet count of 48K.  Family Communication: Husband at bedside.  Disposition: Status is: Observation The patient remains OBS appropriate and will d/c before 2 midnights.   Planned Discharge Destination: Home  Time spent: 35 minutes  Author: Vassie Loll, MD 12/30/2022 4:27 PM  For on call review  [email or web address].

## 2022-12-30 NOTE — ED Notes (Signed)
This RN attempted to call [facility] for potassium from main pharmacy

## 2022-12-31 DIAGNOSIS — N179 Acute kidney failure, unspecified: Secondary | ICD-10-CM | POA: Diagnosis not present

## 2022-12-31 DIAGNOSIS — I10 Essential (primary) hypertension: Secondary | ICD-10-CM | POA: Diagnosis not present

## 2022-12-31 DIAGNOSIS — R1115 Cyclical vomiting syndrome unrelated to migraine: Secondary | ICD-10-CM | POA: Diagnosis not present

## 2022-12-31 DIAGNOSIS — E876 Hypokalemia: Secondary | ICD-10-CM | POA: Diagnosis not present

## 2022-12-31 LAB — BASIC METABOLIC PANEL
Anion gap: 7 (ref 5–15)
BUN: 13 mg/dL (ref 6–20)
CO2: 25 mmol/L (ref 22–32)
Calcium: 8.3 mg/dL — ABNORMAL LOW (ref 8.9–10.3)
Chloride: 107 mmol/L (ref 98–111)
Creatinine, Ser: 0.99 mg/dL (ref 0.44–1.00)
GFR, Estimated: >60 mL/min (ref >60)
Glucose, Bld: 95 mg/dL (ref 70–99)
Potassium: 3.1 mmol/L — ABNORMAL LOW (ref 3.5–5.1)
Sodium: 139 mmol/L (ref 135–145)

## 2022-12-31 MED ORDER — POTASSIUM CHLORIDE CRYS ER 20 MEQ PO TBCR
40.0000 meq | EXTENDED_RELEASE_TABLET | Freq: Once | ORAL | Status: AC
  Administered 2022-12-31: 40 meq via ORAL
  Filled 2022-12-31: qty 2

## 2022-12-31 MED ORDER — ONDANSETRON 4 MG PO TBDP: 4.0000 mg | ORAL_TABLET | Freq: Three times a day (TID) | ORAL | 0 refills | Status: AC | PRN

## 2022-12-31 NOTE — Progress Notes (Signed)
I was speaking to patient about her diet and she made a statement that leads me believe that she needs a nutrition consult. Plan of care ongoing.

## 2022-12-31 NOTE — Plan of Care (Signed)

## 2022-12-31 NOTE — Discharge Summary (Signed)
Physician Discharge Summary   Patient: Beverly Elliott MRN: [id number] DOB: [birth year]  Admit date:     12/29/2022  Discharge date: 12/31/22  Discharge Physician: Vassie Loll   PCP: Trisha Mangle, FNP   Recommendations at discharge:  Repeat basic metabolic panel to follow electrolytes and renal function Reassess blood pressure and determine the need of antihypertensive agents   Discharge Diagnoses: Principal Problem:   Hypokalemia Active Problems:   Cyclical vomiting with nausea   AKI (acute kidney injury) [facility])   Essential hypertension  Brief hospital admission narrative: As per H&P written by Dr. Mariea Clonts on 12/29/2022 Beverly Elliott is a 52 y.o. female with medical history significant for diabetes mellitus, OSA, anxiety, depression, hypertension. Patient presented to the ED with complaints of vomiting and lower abdominal pain over the past 2 days.  She reports she was driving, stopped at a restaurant, and believes she ate food that was cross-contaminated with alpha gal.  She has alpha gal syndrome, and the symptoms typically present like this.  She reports multiple episodes of vomiting since onset. Patient reports Zofran, Phenergan, Reglan did not help with her vomiting.  Only Ativan helps.   ED Course: Tmax 99.9.  Heart rate 83-116.  Respirate rate 11-22.  Blood pressure systolic 116-150.  O2 sats greater than 92% on room air. WBC 16.6.  Potassium 2.  Creatinine 1.5. 2 L bolus given.  Potassium supplementation started.  Ativan 0.5mg  and then 1 mg given.  Assessment and Plan: * Hypokalemia -In the setting of GI losses -Improved/resolved at time of discharge -Repeat basic metabolic panel at follow-up visit to assess stability. -No telemetry abnormalities.  AKI (acute kidney injury) (HCC) -Acute kidney injury in the setting of prerenal azotemia and dehydration from GI losses. -Resolved and creatinine within normal range at discharge. -Patient advised to maintain  adequate hydration.  Cyclical vomiting with nausea -Vomiting with abdominal tenderness.  No diarrhea.  Tmax 99.9.  Leukocytosis of 16.6.   -Multiple episodes of vomiting over the past 2 days prior to admission.  Reports alpha gal syndrome is likely etiology has had similar episodes in the past.   -Patient's nausea and vomiting resolved.  Tolerating diet. -Feeling ready to go home. -WBCs and electrolytes has been repleted/stabilized and back to normal. -Patient advised to maintain adequate hydration. -Continue as needed antiemetics and follow electrolytes trend at follow-up visit with repeat basic metabolic panel.  Essential hypertension -Blood pressure overall stable -Will continue monitoring vital signs and focusing on heart healthy/low-sodium diet. -Patient reports not taking any antihypertensive agents currently. -Reassess blood pressure at follow-up visit and further determine the need of antihypertensive agents.  Class I obesity: -Low-calorie diet, portion control and increasing activity discussed with patient -Body mass index is 33.51 kg/m.   Consultants: None Procedures performed: See below for x-ray reports. Disposition: Home Diet recommendation: Heart healthy diet.  DISCHARGE MEDICATION: Allergies as of 12/31/2022       Reactions   Latex Swelling   Metoclopramide Nausea And Vomiting   Vomited for 8 hours   Alpha-gal    Pork, beef, dairy, gelatin   Metoclopramide Hcl Nausea And Vomiting   Vomited for 8 hours   Tape Rash   Adhesive tape        Medication List     STOP taking these medications    amLODipine 10 MG tablet Commonly known as: NORVASC   escitalopram 20 MG tablet Commonly known as: LEXAPRO   hydrochlorothiazide 25 MG tablet Commonly known as: HYDRODIURIL  TAKE these medications    buPROPion 300 MG 24 hr tablet Commonly known as: WELLBUTRIN XL Take 300 mg by mouth daily.   cetirizine 10 MG tablet Commonly known as: ZYRTEC Take 1  tablet (10 mg total) by mouth daily.   dicyclomine 20 MG tablet Commonly known as: BENTYL Take 20 mg by mouth as needed for spasms.   EPINEPHrine 0.3 mg/0.3 mL Soaj injection Commonly known as: EPI-PEN Inject 0.3 mg into the muscle as needed for anaphylaxis.   ondansetron 4 MG disintegrating tablet Commonly known as: ZOFRAN-ODT Take 1 tablet (4 mg total) by mouth every 8 (eight) hours as needed for nausea or vomiting. What changed:  how much to take when to take this reasons to take this   pantoprazole 40 MG tablet Commonly known as: PROTONIX Take 1 tablet by mouth daily.   rosuvastatin 10 MG tablet Commonly known as: CRESTOR Take 1 tablet by mouth daily.        Follow-up Information     Trisha Mangle, FNP. Schedule an appointment as soon as possible for a visit in 10 day(s).   Specialty: Family Medicine Contact information: [address] [place] [address] [phone]                Discharge Exam: Ceasar Mons Weights   12/29/22 1711 12/30/22 0434  Weight: 79.8 kg 83.1 kg   General exam: Alert, awake, oriented x 3; no chest pain, no fever, no shortness of breath.  Patient reports no further episode of nausea and vomiting and feels ready to have diet advance. Respiratory system: Clear to auscultation. Respiratory effort normal.  Good saturation on room air; no using accessory muscle. Cardiovascular system:RRR. No rubs or gallops. Gastrointestinal system: Abdomen is nondistended, soft and nontender. No organomegaly or masses felt. Normal bowel sounds heard. Central nervous system: Alert and oriented. No focal neurological deficits. Extremities: No cyanosis or clubbing. Skin: No petechiae. Psychiatry: Judgement and insight appear normal. Mood & affect appropriate.   Condition at discharge: Stable and improved.  The results of significant diagnostics from this hospitalization (including imaging, microbiology, ancillary and laboratory) are listed below  for reference.   Imaging Studies: CT ABDOMEN PELVIS WO CONTRAST  Result Date: 12/29/2022 CLINICAL DATA:  Vomiting. EXAM: CT ABDOMEN AND PELVIS WITHOUT CONTRAST TECHNIQUE: Multidetector CT imaging of the abdomen and pelvis was performed following the standard protocol without IV contrast. RADIATION DOSE REDUCTION: This exam was performed according to the departmental dose-optimization program which includes automated exposure control, adjustment of the mA and/or kV according to patient size and/or use of iterative reconstruction technique. COMPARISON:  None Available. FINDINGS: Lower chest: No acute abnormality. Hepatobiliary: A 1.5 cm x 1.0 cm area of focal fatty infiltration (approximately 3.17 Hounsfield units) is noted within the anterior aspect of the right lobe of the liver. This is adjacent to the falciform ligament. No gallstones, gallbladder wall thickening, or biliary dilatation. Pancreas: Unremarkable. No pancreatic ductal dilatation or surrounding inflammatory changes. Spleen: Normal in size without focal abnormality. Adrenals/Urinary Tract: There is moderate severity, diffuse, bilateral adrenal gland enlargement. Kidneys are normal, without renal calculi, focal lesion, or hydronephrosis. Bladder is unremarkable. Stomach/Bowel: Stomach is within normal limits. Appendix appears normal. No evidence of bowel wall thickening, distention, or inflammatory changes. Noninflamed diverticula are seen throughout the sigmoid colon. Vascular/Lymphatic: Aortic atherosclerosis. No enlarged abdominal or pelvic lymph nodes. Reproductive: Status post hysterectomy. No adnexal masses. Other: No abdominal wall hernia or abnormality. No abdominopelvic ascites. Musculoskeletal: Moderate to marked severity degenerative  changes are seen at the levels of L4-L5 and L5-S1. No acute osseous abnormalities are identified. IMPRESSION: 1. Sigmoid diverticulosis. 2. Evidence of prior hysterectomy. 3. Moderate to marked severity  degenerative changes at the levels of L4-L5 and L5-S1. 4. Aortic atherosclerosis. Aortic Atherosclerosis (ICD10-I70.0). Electronically Signed   By: Aram Candela M.D.   On: 12/29/2022 23:54    Microbiology: Results for orders placed or performed during the hospital encounter of 12/29/22  MRSA Next Gen by PCR, Nasal     Status: None   Collection Time: 12/30/22 12:35 AM   Specimen: Nasal Mucosa; Nasal Swab  Result Value Ref Range Status   MRSA by PCR Next Gen NOT DETECTED NOT DETECTED Final    Comment: (NOTE) The GeneXpert MRSA Assay (FDA approved for NASAL specimens only), is one component of a comprehensive MRSA colonization surveillance program. It is not intended to diagnose MRSA infection nor to guide or monitor treatment for MRSA infections. Test performance is not FDA approved in patients less than 3 years old. Performed at [facility], [address], [place], [place] [address]     Labs: CBC: Recent Labs  Lab 12/29/22 1838 12/30/22 0436  WBC 16.6* 12.3*  NEUTROABS 12.6*  --   HGB 15.7* 12.6  HCT 44.9 38.0  MCV 85.2 89.0  PLT 345 248   Basic Metabolic Panel: Recent Labs  Lab 12/29/22 1922 12/30/22 0436 12/31/22 0426  NA 140 139 139  K 2.0* 2.6* 3.1*  CL 96* 101 107  CO2 30 28 25   GLUCOSE 112* 96 95  BUN 27* 22* 13  CREATININE 1.53* 1.21* 0.99  CALCIUM 9.2 8.3* 8.3*  MG 1.9  --   --    Liver Function Tests: Recent Labs  Lab 12/29/22 1922  AST 19  ALT 17  ALKPHOS 64  BILITOT 0.9  PROT 7.4  ALBUMIN 4.3   CBG: No results for input(s): "GLUCAP" in the last 168 hours.  Discharge time spent: greater than 30 minutes.  Signed: Vassie Loll, MD [facility] 12/31/2022

## 2023-01-05 ENCOUNTER — Encounter: Payer: Self-pay | Admitting: Internal Medicine

## 2023-01-06 ENCOUNTER — Telehealth: Payer: Self-pay | Admitting: *Deleted

## 2023-01-06 NOTE — Telephone Encounter (Signed)
Called patient and advised approval, copay card and submit for XOlair to [facility]. Will reach out once delivery set to make appt for next injection in clinic

## 2023-01-06 NOTE — Telephone Encounter (Signed)
-----   Message from Ferol Luz sent at 12/31/2022 12:54 PM EDT ----- I would like to start this patient on xolir 150mg  every 4 weeks for food allergy (alpha gal), sample given on 12/24/22.  Thanks!

## 2023-01-07 NOTE — Telephone Encounter (Signed)
Thanks

## 2023-03-07 ENCOUNTER — Ambulatory Visit (INDEPENDENT_AMBULATORY_CARE_PROVIDER_SITE_OTHER): Payer: BC Managed Care – PPO

## 2023-03-07 DIAGNOSIS — T7800XA Anaphylactic reaction due to unspecified food, initial encounter: Secondary | ICD-10-CM

## 2023-03-07 DIAGNOSIS — Z91018 Allergy to other foods: Secondary | ICD-10-CM

## 2023-04-06 ENCOUNTER — Ambulatory Visit: Payer: BC Managed Care – PPO

## 2023-04-06 DIAGNOSIS — Z91018 Allergy to other foods: Secondary | ICD-10-CM

## 2023-04-26 ENCOUNTER — Telehealth: Payer: Self-pay | Admitting: Internal Medicine

## 2023-04-26 NOTE — Telephone Encounter (Addendum)
Left voicemail to give the office a call back to schedule Xolair reapproval appointment.

## 2023-05-02 ENCOUNTER — Ambulatory Visit (INDEPENDENT_AMBULATORY_CARE_PROVIDER_SITE_OTHER): Payer: BC Managed Care – PPO

## 2023-05-02 DIAGNOSIS — Z91018 Allergy to other foods: Secondary | ICD-10-CM | POA: Diagnosis not present

## 2023-05-13 ENCOUNTER — Ambulatory Visit: Payer: BC Managed Care – PPO | Admitting: Internal Medicine

## 2023-05-16 ENCOUNTER — Other Ambulatory Visit: Payer: Self-pay

## 2023-05-16 ENCOUNTER — Observation Stay (HOSPITAL_COMMUNITY)
Admission: EM | Admit: 2023-05-16 | Discharge: 2023-05-17 | Disposition: A | Discharge status: Home / Self Care | Payer: BC Managed Care – PPO | Attending: Family Medicine | Admitting: Family Medicine

## 2023-05-16 ENCOUNTER — Encounter (HOSPITAL_COMMUNITY): Payer: Self-pay

## 2023-05-16 DIAGNOSIS — F410 Panic disorder [episodic paroxysmal anxiety] without agoraphobia: Secondary | ICD-10-CM | POA: Diagnosis present

## 2023-05-16 DIAGNOSIS — Z87891 Personal history of nicotine dependence: Secondary | ICD-10-CM | POA: Diagnosis not present

## 2023-05-16 DIAGNOSIS — E876 Hypokalemia: Secondary | ICD-10-CM | POA: Diagnosis not present

## 2023-05-16 DIAGNOSIS — I10 Essential (primary) hypertension: Secondary | ICD-10-CM | POA: Diagnosis not present

## 2023-05-16 DIAGNOSIS — Z79899 Other long term (current) drug therapy: Secondary | ICD-10-CM | POA: Insufficient documentation

## 2023-05-16 DIAGNOSIS — N179 Acute kidney failure, unspecified: Secondary | ICD-10-CM | POA: Diagnosis not present

## 2023-05-16 DIAGNOSIS — F411 Generalized anxiety disorder: Secondary | ICD-10-CM | POA: Diagnosis present

## 2023-05-16 DIAGNOSIS — G43909 Migraine, unspecified, not intractable, without status migrainosus: Secondary | ICD-10-CM | POA: Diagnosis present

## 2023-05-16 DIAGNOSIS — R112 Nausea with vomiting, unspecified: Secondary | ICD-10-CM | POA: Diagnosis present

## 2023-05-16 DIAGNOSIS — F32A Depression, unspecified: Secondary | ICD-10-CM | POA: Diagnosis present

## 2023-05-16 DIAGNOSIS — Z91040 Latex allergy status: Secondary | ICD-10-CM | POA: Diagnosis not present

## 2023-05-16 DIAGNOSIS — J45909 Unspecified asthma, uncomplicated: Secondary | ICD-10-CM | POA: Insufficient documentation

## 2023-05-16 DIAGNOSIS — K219 Gastro-esophageal reflux disease without esophagitis: Secondary | ICD-10-CM | POA: Diagnosis present

## 2023-05-16 DIAGNOSIS — E1165 Type 2 diabetes mellitus with hyperglycemia: Secondary | ICD-10-CM | POA: Diagnosis present

## 2023-05-16 DIAGNOSIS — Z91018 Allergy to other foods: Secondary | ICD-10-CM | POA: Diagnosis present

## 2023-05-16 LAB — MAGNESIUM: Magnesium: 1.8 mg/dL (ref 1.7–2.4)

## 2023-05-16 LAB — GLUCOSE, CAPILLARY: Glucose-Capillary: 93 mg/dL (ref 70–99)

## 2023-05-16 LAB — CBC WITH DIFFERENTIAL/PLATELET
Abs Immature Granulocytes: 0.04 10*3/uL (ref 0.00–0.07)
Basophils Absolute: 0 10*3/uL (ref 0.0–0.1)
Basophils Relative: 0 %
Eosinophils Absolute: 0.1 10*3/uL (ref 0.0–0.5)
Eosinophils Relative: 1 %
HCT: 43.3 % (ref 36.0–46.0)
Hemoglobin: 14.6 g/dL (ref 12.0–15.0)
Immature Granulocytes: 0 %
Lymphocytes Relative: 24 %
Lymphs Abs: 2.5 10*3/uL (ref 0.7–4.0)
MCH: 29.3 pg (ref 26.0–34.0)
MCHC: 33.7 g/dL (ref 30.0–36.0)
MCV: 86.9 fL (ref 80.0–100.0)
Monocytes Absolute: 0.9 10*3/uL (ref 0.1–1.0)
Monocytes Relative: 8 %
Neutro Abs: 7.1 10*3/uL (ref 1.7–7.7)
Neutrophils Relative %: 67 %
Platelets: 365 10*3/uL (ref 150–400)
RBC: 4.98 MIL/uL (ref 3.87–5.11)
RDW: 13.6 % (ref 11.5–15.5)
WBC: 10.7 10*3/uL — ABNORMAL HIGH (ref 4.0–10.5)
nRBC: 0 % (ref 0.0–0.2)

## 2023-05-16 LAB — COMPREHENSIVE METABOLIC PANEL
ALT: 15 U/L (ref 0–44)
AST: 22 U/L (ref 15–41)
Albumin: 4.7 g/dL (ref 3.5–5.0)
Alkaline Phosphatase: 63 U/L (ref 38–126)
Anion gap: 16 — ABNORMAL HIGH (ref 5–15)
BUN: 24 mg/dL — ABNORMAL HIGH (ref 6–20)
CO2: 25 mmol/L (ref 22–32)
Calcium: 10.4 mg/dL — ABNORMAL HIGH (ref 8.9–10.3)
Chloride: 98 mmol/L (ref 98–111)
Creatinine, Ser: 1.82 mg/dL — ABNORMAL HIGH (ref 0.44–1.00)
GFR, Estimated: 33 mL/min — ABNORMAL LOW (ref >60)
Glucose, Bld: 137 mg/dL — ABNORMAL HIGH (ref 70–99)
Potassium: 2.5 mmol/L — CL (ref 3.5–5.1)
Sodium: 139 mmol/L (ref 135–145)
Total Bilirubin: 0.7 mg/dL (ref 0.0–1.2)
Total Protein: 8.1 g/dL (ref 6.5–8.1)

## 2023-05-16 LAB — PHOSPHORUS: Phosphorus: 1.1 mg/dL — ABNORMAL LOW (ref 2.5–4.6)

## 2023-05-16 MED ORDER — LACTATED RINGERS IV BOLUS
1000.0000 mL | Freq: Once | INTRAVENOUS | Status: AC
  Administered 2023-05-16: 1000 mL via INTRAVENOUS

## 2023-05-16 MED ORDER — ONDANSETRON HCL 4 MG/2ML IJ SOLN: 4.0000 mg | Freq: Four times a day (QID) | INTRAMUSCULAR | Status: DC | PRN

## 2023-05-16 MED ORDER — PANTOPRAZOLE SODIUM 40 MG PO TBEC: 40.0000 mg | DELAYED_RELEASE_TABLET | Freq: Every day | ORAL | Status: DC

## 2023-05-16 MED ORDER — BUPROPION HCL ER (XL) 300 MG PO TB24: 300.0000 mg | ORAL_TABLET | Freq: Every day | ORAL | Status: DC

## 2023-05-16 MED ORDER — ACETAMINOPHEN 325 MG PO TABS: 650.0000 mg | ORAL_TABLET | Freq: Four times a day (QID) | ORAL | Status: DC | PRN

## 2023-05-16 MED ORDER — DICYCLOMINE HCL 20 MG PO TABS: 20.0000 mg | ORAL_TABLET | Freq: Three times a day (TID) | ORAL | Status: DC | PRN

## 2023-05-16 MED ORDER — HYDROMORPHONE HCL 1 MG/ML IJ SOLN
0.5000 mg | INTRAMUSCULAR | Status: DC | PRN
Start: 2023-05-16 — End: 2023-05-17

## 2023-05-16 MED ORDER — POTASSIUM CHLORIDE CRYS ER 20 MEQ PO TBCR
40.0000 meq | EXTENDED_RELEASE_TABLET | Freq: Once | ORAL | Status: AC
  Administered 2023-05-16: 40 meq via ORAL
  Filled 2023-05-16: qty 2

## 2023-05-16 MED ORDER — KETOROLAC TROMETHAMINE 15 MG/ML IJ SOLN: 15.0000 mg | Freq: Once | INTRAMUSCULAR | Status: DC

## 2023-05-16 MED ORDER — SODIUM CHLORIDE 0.9 % IV SOLN: INTRAVENOUS | Status: AC

## 2023-05-16 MED ORDER — FAMOTIDINE IN NACL 20-0.9 MG/50ML-% IV SOLN
20.0000 mg | Freq: Once | INTRAVENOUS | Status: AC
  Administered 2023-05-16: 20 mg via INTRAVENOUS
  Filled 2023-05-16: qty 50

## 2023-05-16 MED ORDER — POTASSIUM CHLORIDE 10 MEQ/100ML IV SOLN
10.0000 meq | Freq: Once | INTRAVENOUS | Status: AC
  Administered 2023-05-16: 10 meq via INTRAVENOUS
  Filled 2023-05-16: qty 100

## 2023-05-16 MED ORDER — LORAZEPAM 2 MG/ML IJ SOLN: 1.0000 mg | Freq: Four times a day (QID) | INTRAMUSCULAR | Status: DC | PRN

## 2023-05-16 MED ORDER — ONDANSETRON HCL 4 MG PO TABS: 4.0000 mg | ORAL_TABLET | Freq: Four times a day (QID) | ORAL | Status: DC | PRN

## 2023-05-16 MED ORDER — BISACODYL 5 MG PO TBEC: 5.0000 mg | DELAYED_RELEASE_TABLET | Freq: Every day | ORAL | Status: DC | PRN

## 2023-05-16 MED ORDER — HYDRALAZINE HCL 20 MG/ML IJ SOLN
10.0000 mg | INTRAMUSCULAR | Status: DC | PRN
Start: 2023-05-16 — End: 2023-05-17

## 2023-05-16 MED ORDER — ACETAMINOPHEN 650 MG RE SUPP: 650.0000 mg | Freq: Four times a day (QID) | RECTAL | Status: DC | PRN

## 2023-05-16 MED ORDER — RIVAROXABAN 10 MG PO TABS: 10.0000 mg | ORAL_TABLET | Freq: Every day | ORAL | Status: DC

## 2023-05-16 MED ORDER — SODIUM CHLORIDE 0.9% FLUSH
3.0000 mL | Freq: Two times a day (BID) | INTRAVENOUS | Status: DC
  Administered 2023-05-16: 3 mL via INTRAVENOUS

## 2023-05-16 MED ORDER — LEVALBUTEROL HCL 0.63 MG/3ML IN NEBU: 0.6300 mg | INHALATION_SOLUTION | Freq: Four times a day (QID) | RESPIRATORY_TRACT | Status: DC | PRN

## 2023-05-16 MED ORDER — LORAZEPAM 2 MG/ML IJ SOLN
1.0000 mg | Freq: Once | INTRAMUSCULAR | Status: AC
  Administered 2023-05-16: 1 mg via INTRAVENOUS
  Filled 2023-05-16: qty 1

## 2023-05-16 MED ORDER — INSULIN ASPART 100 UNIT/ML IJ SOLN: 0.0000 [IU] | Freq: Three times a day (TID) | INTRAMUSCULAR | Status: DC

## 2023-05-16 MED ORDER — EPINEPHRINE 0.3 MG/0.3ML IJ SOAJ: 0.3000 mg | INTRAMUSCULAR | Status: DC | PRN

## 2023-05-16 MED ORDER — TRAZODONE HCL 50 MG PO TABS: 25.0000 mg | ORAL_TABLET | Freq: Every evening | ORAL | Status: DC | PRN

## 2023-05-16 MED ORDER — SODIUM CHLORIDE 0.9% FLUSH
3.0000 mL | Freq: Two times a day (BID) | INTRAVENOUS | Status: DC
  Administered 2023-05-16 – 2023-05-17 (×2): 3 mL via INTRAVENOUS

## 2023-05-16 MED ORDER — ROSUVASTATIN CALCIUM 10 MG PO TABS
10.0000 mg | ORAL_TABLET | Freq: Every day | ORAL | Status: DC
  Filled 2023-05-16: qty 1

## 2023-05-16 MED ORDER — MAGNESIUM OXIDE -MG SUPPLEMENT 400 (240 MG) MG PO TABS
800.0000 mg | ORAL_TABLET | Freq: Once | ORAL | Status: AC
  Administered 2023-05-16: 800 mg via ORAL
  Filled 2023-05-16: qty 2

## 2023-05-16 MED ORDER — PANTOPRAZOLE SODIUM 40 MG PO TBEC
40.0000 mg | DELAYED_RELEASE_TABLET | Freq: Every day | ORAL | Status: DC
  Administered 2023-05-16: 40 mg via ORAL
  Filled 2023-05-16: qty 1

## 2023-05-16 MED ORDER — PROCHLORPERAZINE EDISYLATE 10 MG/2ML IJ SOLN: 10.0000 mg | Freq: Once | INTRAMUSCULAR | Status: DC

## 2023-05-16 MED ORDER — DIPHENHYDRAMINE HCL 50 MG/ML IJ SOLN: 25.0000 mg | Freq: Once | INTRAMUSCULAR | Status: DC

## 2023-05-16 MED ORDER — ESCITALOPRAM OXALATE 10 MG PO TABS
20.0000 mg | ORAL_TABLET | Freq: Every day | ORAL | Status: DC
  Administered 2023-05-16: 20 mg via ORAL
  Filled 2023-05-16: qty 2

## 2023-05-16 MED ORDER — IPRATROPIUM BROMIDE 0.02 % IN SOLN: 0.5000 mg | Freq: Four times a day (QID) | RESPIRATORY_TRACT | Status: DC | PRN

## 2023-05-16 MED ORDER — SENNOSIDES-DOCUSATE SODIUM 8.6-50 MG PO TABS: 1.0000 | ORAL_TABLET | Freq: Every evening | ORAL | Status: DC | PRN

## 2023-05-16 MED ORDER — OXYCODONE HCL 5 MG PO TABS: 5.0000 mg | ORAL_TABLET | ORAL | Status: DC | PRN

## 2023-05-16 MED ORDER — BUPROPION HCL ER (XL) 300 MG PO TB24
300.0000 mg | ORAL_TABLET | Freq: Every day | ORAL | Status: DC
  Administered 2023-05-16: 300 mg via ORAL
  Filled 2023-05-16: qty 1

## 2023-05-16 MED ORDER — FLEET ENEMA RE ENEM: 1.0000 | ENEMA | Freq: Once | RECTAL | Status: DC | PRN

## 2023-05-16 NOTE — Assessment & Plan Note (Signed)
-   Apparently patient has an alpha gal allergies, -Reported ingestion of cross-contamination with beef products -Continue with IV fluids -Antiemetics she responds well to Ativan -Continue encourage p.o. intake

## 2023-05-16 NOTE — Assessment & Plan Note (Signed)
-   Continue home medication, as needed Ativan

## 2023-05-16 NOTE — Assessment & Plan Note (Signed)
-   Will monitor Continue home medication including with as needed Ativan

## 2023-05-16 NOTE — H&P (Signed)
 History and Physical   Patient: Beverly Elliott                            PCP: Jesus Elberta Gainer, FNP                    DOB: Jun 10, 1970            DOA: 05/16/2023 FMW:[id number]             DOS: 05/16/2023, 1:28 PM  Jesus Elberta Gainer, FNP  Patient coming from:   HOME  I have personally reviewed patient's medical records, in electronic medical records, including:  [facility], and care everywhere.    Chief Complaint:   Chief Complaint  Patient presents with   Medical Problem    Pt in for symptoms following Alpha Gal treatment for tick bite. Symptoms of Nausea, vomiting, back pain, and bilateral arm numbness. VSS pt AAOx4   Allergic Reaction    History of present illness:    Beverly Elliott 53 year old female with extensive history of alpha gal allergy , depression, HTN, HLD, history of IBS, GERD .SABRASABRA Presented to ED with chief complaint of intractable nausea vomiting after exposure to possibly meet-beef contaminated food ingestion. He reports this happened in the past feels similar.  Nuys of having any bilious or bloody vomitus denies any diarrhea.   ED Evaluation: Blood pressure (!) 133/100, pulse 88, temperature 97.6 F (36.4 C), temperature source Oral, resp. rate (!) 23, height 5' 2 (1.575 m), weight 77.1 kg, SpO2 97%.  Labs WBC 10.7, potassium 2.5, BUN 24, creatinine 1.82, calcium  10.4, Meziane 1.8   Patient responded to IV Ativan , with IV fluids, potassium IV and p.o. encouraged  EDP requested patient to be admitted for close observation and IV hydration    Patient Denies having: Fever, Chills, Cough, SOB, Chest Pain,  headache, dizziness, lightheadedness,  Dysuria, Joint pain, rash, open wounds     Review of Systems: As per HPI, otherwise 10 point review of systems were negative.   ----------------------------------------------------------------------------------------------------------------------  Allergies  Allergen Reactions   Latex Swelling    Metoclopramide  Nausea And Vomiting    Vomited for 8 hours    Alpha-Gal     Pork, beef, dairy, gelatin   Heparin     Metoclopramide  Hcl Nausea And Vomiting    Vomited for 8 hours   Tape Rash    Adhesive tape    Home MEDs:  Prior to Admission medications   Medication Sig Start Date End Date Taking? Authorizing Provider  buPROPion  (WELLBUTRIN  XL) 300 MG 24 hr tablet Take 300 mg by mouth daily. 09/07/21   [provider]  cetirizine  (ZYRTEC ) 10 MG tablet Take 1 tablet (10 mg total) by mouth daily. 12/24/22   Lorin Norris, MD  dicyclomine  (BENTYL ) 20 MG tablet Take 20 mg by mouth as needed for spasms. 08/24/22   [provider]  EPINEPHrine  0.3 mg/0.3 mL IJ SOAJ injection Inject 0.3 mg into the muscle as needed for anaphylaxis. 12/24/22   Lorin Norris, MD  ondansetron  (ZOFRAN -ODT) 4 MG disintegrating tablet Take 1 tablet (4 mg total) by mouth every 8 (eight) hours as needed for nausea or vomiting. 12/31/22   Ricky Fines, MD  pantoprazole  (PROTONIX ) 40 MG tablet Take 1 tablet by mouth daily. 11/03/22   [provider]  rosuvastatin  (CRESTOR ) 10 MG tablet Take 1 tablet by mouth daily. 11/25/22 11/25/23  [provider]    PRN MEDs: acetaminophen  **  OR** acetaminophen , bisacodyl , hydrALAZINE , HYDROmorphone  (DILAUDID ) injection, ipratropium, levalbuterol , LORazepam , ondansetron  **OR** ondansetron  (ZOFRAN ) IV, oxyCODONE , senna-docusate, sodium phosphate, traZODone   Past Medical History:  Diagnosis Date   Anemia    Asthma    Depression    GERD (gastroesophageal reflux disease)    History of chicken pox    IBS (irritable bowel syndrome)    Kidney stones    Migraine    Urticaria    UTI (lower urinary tract infection)     Past Surgical History:  Procedure Laterality Date   ABDOMINAL HYSTERECTOMY     ADENOIDECTOMY     CESAREAN SECTION     TONSILLECTOMY       reports that she has quit smoking. Her smoking use included cigarettes. She has never used  smokeless tobacco. She reports current alcohol use. She reports current drug use. Drug: Marijuana.   Family History  Problem Relation Age of Onset   Coronary artery disease Mother    Hypertension Mother    Allergic rhinitis Father    Coronary artery disease Father    Heart attack Father    Allergic rhinitis Sister    Diabetes Maternal Grandfather    Hypertension Paternal Grandmother    Asthma Neg Hx    Eczema Neg Hx    Urticaria Neg Hx     Physical Exam:   Vitals:   05/16/23 1016 05/16/23 1018 05/16/23 1019 05/16/23 1200  BP: 139/89   (!) 133/100  Pulse: (!) 117   88  Resp:   18 (!) 23  Temp:   97.6 F (36.4 C)   TempSrc:   Oral   SpO2: 99% 99%  97%  Weight:   77.1 kg   Height:   5' 2 (1.575 m)    Constitutional: NAD, calm, comfortable Eyes: PERRL, lids and conjunctivae normal ENMT: Mucous membranes are moist. Posterior pharynx clear of any exudate or lesions.Normal dentition.  Neck: normal, supple, no masses, no thyromegaly Respiratory: clear to auscultation bilaterally, no wheezing, no crackles. Normal respiratory effort. No accessory muscle use.  Cardiovascular: Regular rate and rhythm, no murmurs / rubs / gallops. No extremity edema. 2+ pedal pulses. No carotid bruits.  Abdomen: no tenderness, no masses palpated. No hepatosplenomegaly. Bowel sounds positive.  Musculoskeletal: no clubbing / cyanosis. No joint deformity upper and lower extremities. Good ROM, no contractures. Normal muscle tone.  Neurologic: CN II-XII grossly intact. Sensation intact, DTR normal. Strength 5/5 in all 4.  Psychiatric: Normal judgment and insight. Alert and oriented x 3. Normal mood.  Skin: no rashes, lesions, ulcers. No induration          Labs on admission:    I have personally reviewed following labs and imaging studies  CBC: Recent Labs  Lab 05/16/23 1058  WBC 10.7*  NEUTROABS 7.1  HGB 14.6  HCT 43.3  MCV 86.9  PLT 365   Basic Metabolic Panel: Recent Labs  Lab  05/16/23 1058  NA 139  K 2.5*  CL 98  CO2 25  GLUCOSE 137*  BUN 24*  CREATININE 1.82*  CALCIUM  10.4*  MG 1.8   GFR: Estimated Creatinine Clearance: 35.6 mL/min (A) (by C-G formula based on SCr of 1.82 mg/dL (H)). Liver Function Tests: Recent Labs  Lab 05/16/23 1058  AST 22  ALT 15  ALKPHOS 63  BILITOT 0.7  PROT 8.1  ALBUMIN 4.7    Urine analysis:    Component Value Date/Time   COLORURINE AMBER (A) 12/29/2022 1840   APPEARANCEUR CLOUDY (A) 12/29/2022 1840  LABSPEC 1.025 12/29/2022 1840   PHURINE 5.0 12/29/2022 1840   GLUCOSEU NEGATIVE 12/29/2022 1840   HGBUR SMALL (A) 12/29/2022 1840   BILIRUBINUR NEGATIVE 12/29/2022 1840   KETONESUR 5 (A) 12/29/2022 1840   PROTEINUR 100 (A) 12/29/2022 1840   UROBILINOGEN 0.2 09/03/2013 1353   NITRITE NEGATIVE 12/29/2022 1840   LEUKOCYTESUR NEGATIVE 12/29/2022 1840    Last A1C:  Lab Results  Component Value Date   HGBA1C 5.9 (H) 07/20/2022     Radiologic Exams on Admission:   No results found.  EKG:   Independently reviewed.  Orders placed or performed during the hospital encounter of 05/16/23   EKG 12-Lead   ---------------------------------------------------------------------------------------------------------------------------------------    Assessment / Plan:   Principal Problem:   Intractable nausea and vomiting Active Problems:   AKI (acute kidney injury) (HCC)   Allergy  to alpha-gal   Essential hypertension   Hypokalemia   Anxiety state   Depression   Migraine headache   GERD   Generalized anxiety disorder with panic attacks   Type 2 diabetes mellitus with hyperglycemia (HCC)   Assessment and Plan: * Intractable nausea and vomiting - Apparently patient has an alpha gal allergies, -Reported ingestion of cross-contamination with beef products -Continue with IV fluids -Antiemetics she responds well to Ativan  -Continue encourage p.o. intake  Allergy  to alpha-gal - Extensive history of  alpha gal allergy  -Patient reports she sees allergist currently taking Omalizumab  150 mg subcu every 28 days -Apparently had an exposure with ingested food-positive nausea vomiting Will monitor closely  AKI (acute kidney injury) (HCC) -Due to dehydration-continuing hydration, - monitoring BUN/creatinine  - avoiding nephrotoxins Lab Results  Component Value Date   CREATININE 1.82 (H) 05/16/2023   CREATININE 0.99 12/31/2022   CREATININE 1.21 (H) 12/30/2022     Essential hypertension - Will review home medication-not on any BP meds - Continue to monitor  Hypokalemia - Likely due to nausea vomiting, -ED initiated repleting IV and encouraging p.o.  Type 2 diabetes mellitus with hyperglycemia (HCC) -Reports not diabetic-Tronic record indicates DM2 -Currently not on any diabetic medication -Will check CBG q. ACHS, and A1c Last A1c 5.9  Generalized anxiety disorder with panic attacks - Will monitor Continue home medication including with as needed Ativan   GERD - Continue PPI  Migraine headache - General Analgesics  Depression - Stable continue home medication Wellbutrin   Anxiety state - Continue home medication, as needed Ativan      Consults called:  None   -------------------------------------------------------------------------------------------------------------------------------------------- DVT prophylaxis:  rivaroxaban  (XARELTO ) tablet 10 mg Start: 05/16/23 1330 TED hose Start: 05/16/23 1314 SCDs Start: 05/16/23 1314 rivaroxaban  (XARELTO ) tablet 10 mg   Code Status:   Code Status: Full Code   Admission status: Patient will be admitted as Observation, with a greater than 2 midnight length of stay. Level of care: Med-Surg   Family Communication:  none at bedside  (The above findings and plan of care has been discussed with patient in detail, the patient expressed understanding and agreement of above plan)   --------------------------------------------------------------------------------------------------------------------------------------------------  Disposition Plan:  Anticipated 1-2 days Status is: Observation The patient remains OBS appropriate and will d/c before 2 midnights.   ---------------------------------------------------------------------------------------------------------------------  Time spent:  29  Min.  Was spent seeing and evaluating the patient, reviewing all medical records, drawn plan of care.  SIGNED: Adriana DELENA Grams, MD, FHM. FAAFP. [facility], Pager  (Please use [email or web address] to page/ or secure chat through epic) If 7PM-7AM, please contact night-coverage [email or web address],  05/16/2023, 1:28 PM

## 2023-05-16 NOTE — ED Provider Notes (Signed)
 [place] EMERGENCY DEPARTMENT AT [facility] Provider Note  CSN: [id number] Arrival date & time: 05/16/23 1001  Chief Complaint(s) Medical Problem (Pt in for symptoms following Alpha Gal treatment for tick bite. Symptoms of Nausea, vomiting, back pain, and bilateral arm numbness. VSS pt AAOx4) and Allergic Reaction  HPI Beverly Elliott is a 53 y.o. female with PMH alpha gal on monthly omalizumab , GERD, IBS, nephrolithiasis who presents emergency department for evaluation of nausea, vomiting, numbness, tingling.  She states that she went out to eat and is concerned about some possible cross-contamination with her food triggering and worsening her alpha gal.  States that she has had this in the past and this feels very similar especially with hypokalemia that she frequently gets.  She endorses persistent nausea and vomiting and has been unable to tolerate p.o.  Denies chest pain, shortness of breath, headache, fever or other systemic symptoms.   Past Medical History Past Medical History:  Diagnosis Date   Anemia    Asthma    Depression    GERD (gastroesophageal reflux disease)    History of chicken pox    IBS (irritable bowel syndrome)    Kidney stones    Migraine    Urticaria    UTI (lower urinary tract infection)    Patient Active Problem List   Diagnosis Date Noted   Intractable nausea and vomiting 07/20/2022   Type 2 diabetes mellitus with hyperglycemia (HCC) 07/20/2022   Mixed hyperlipidemia 07/20/2022   Obesity (BMI 30-39.9) 07/20/2022   Leukocytosis 01/11/2022   AKI (acute kidney injury) (HCC) 11/19/2021   Hypokalemia 11/19/2021   Intractable vomiting 11/19/2021   Prolonged QT interval 11/19/2021   Excessive daytime sleepiness 01/06/2021   Obstructive sleep apnea 01/06/2021   Chronic back pain 09/01/2020   Cyclical vomiting with nausea 09/20/2019   Severe obesity (BMI 35.0-39.9) with comorbidity (HCC) 06/14/2019   Arthritis 03/14/2019   Essential  hypertension 07/24/2018   Colon spasm 03/14/2017   Generalized anxiety disorder with panic attacks 03/14/2017   DDD (degenerative disc disease), lumbosacral 01/12/2017   Fatty liver 01/12/2017   Seasonal allergic rhinitis due to pollen 01/12/2017   Urinary urgency 01/12/2017   Soft tissue mass 06/17/2014   Ganglion cyst of flexor tendon sheath of finger of right hand 06/17/2014   Decreased libido 09/20/2013   Other malaise and fatigue 09/20/2013   Sinusitis, acute maxillary 08/01/2013   Asthmatic bronchitis with exacerbation 01/19/2013   Skin tag 10/04/2012   Breast mass 12/17/2010   Candidiasis 05/27/2010   Anxiety state 03/30/2010   RESTLESS LEGS SYNDROME 03/30/2010   IBS 03/30/2010   ANEMIA-NOS 03/18/2010   Depression 03/18/2010   Migraine headache 03/18/2010   GERD 03/18/2010   Radiculopathy, lumbosacral region 03/18/2010   Recurrent major depressive disorder, in full remission (HCC) 03/18/2010   Home Medication(s) Prior to Admission medications   Medication Sig Start Date End Date Taking? Authorizing Provider  buPROPion  (WELLBUTRIN  XL) 300 MG 24 hr tablet Take 300 mg by mouth daily. 09/07/21   [provider]  cetirizine  (ZYRTEC ) 10 MG tablet Take 1 tablet (10 mg total) by mouth daily. 12/24/22   Lorin Norris, MD  dicyclomine  (BENTYL ) 20 MG tablet Take 20 mg by mouth as needed for spasms. 08/24/22   [provider]  EPINEPHrine  0.3 mg/0.3 mL IJ SOAJ injection Inject 0.3 mg into the muscle as needed for anaphylaxis. 12/24/22   Lorin Norris, MD  ondansetron  (ZOFRAN -ODT) 4 MG disintegrating tablet Take 1 tablet (4 mg total) by  mouth every 8 (eight) hours as needed for nausea or vomiting. 12/31/22   Ricky Fines, MD  pantoprazole  (PROTONIX ) 40 MG tablet Take 1 tablet by mouth daily. 11/03/22   [provider]  rosuvastatin  (CRESTOR ) 10 MG tablet Take 1 tablet by mouth daily. 11/25/22 11/25/23  [provider]                                                                                                                                     Past Surgical History Past Surgical History:  Procedure Laterality Date   ABDOMINAL HYSTERECTOMY     ADENOIDECTOMY     CESAREAN SECTION     TONSILLECTOMY     Family History Family History  Problem Relation Age of Onset   Coronary artery disease Mother    Hypertension Mother    Allergic rhinitis Father    Coronary artery disease Father    Heart attack Father    Allergic rhinitis Sister    Diabetes Maternal Grandfather    Hypertension Paternal Grandmother    Asthma Neg Hx    Eczema Neg Hx    Urticaria Neg Hx     Social History Social History   Tobacco Use   Smoking status: Former    Current packs/day: 0.80    Types: Cigarettes   Smokeless tobacco: Never  Vaping Use   Vaping status: Never Used  Substance Use Topics   Alcohol use: Yes    Comment: rarely   Drug use: Yes    Types: Marijuana    Comment: last smoked marijuana a couple of days ago   Allergies Latex, Metoclopramide , Alpha-gal, Heparin , Metoclopramide  hcl, and Tape  Review of Systems Review of Systems  Gastrointestinal:  Positive for abdominal pain, nausea and vomiting.  Neurological:  Positive for numbness.    Physical Exam Vital Signs  I have reviewed the triage vital signs BP 139/89   Pulse (!) 117   Temp 97.6 F (36.4 C) (Oral)   Resp 18   Ht 5' 2 (1.575 m)   Wt 77.1 kg   SpO2 99%   BMI 31.09 kg/m   Physical Exam Vitals and nursing note reviewed.  Constitutional:      General: She is not in acute distress.    Appearance: She is well-developed.  HENT:     Head: Normocephalic and atraumatic.  Eyes:     Conjunctiva/sclera: Conjunctivae normal.  Cardiovascular:     Rate and Rhythm: Normal rate and regular rhythm.     Heart sounds: No murmur heard. Pulmonary:     Effort: Pulmonary effort is normal. No respiratory distress.     Breath sounds: Normal breath sounds.  Abdominal:      Palpations: Abdomen is soft.     Tenderness: There is no abdominal tenderness.  Musculoskeletal:        General: No swelling.     Cervical back: Neck supple.  Skin:  General: Skin is warm and dry.     Capillary Refill: Capillary refill takes less than 2 seconds.  Neurological:     Mental Status: She is alert.  Psychiatric:        Mood and Affect: Mood normal.     ED Results and Treatments Labs (all labs ordered are listed, but only abnormal results are displayed) Labs Reviewed  COMPREHENSIVE METABOLIC PANEL - Abnormal; Notable for the following components:      Result Value   Potassium 2.5 (*)    Glucose, Bld 137 (*)    BUN 24 (*)    Creatinine, Ser 1.82 (*)    Calcium  10.4 (*)    GFR, Estimated 33 (*)    Anion gap 16 (*)    All other components within normal limits  CBC WITH DIFFERENTIAL/PLATELET - Abnormal; Notable for the following components:   WBC 10.7 (*)    All other components within normal limits  MAGNESIUM                                                                                                                           Radiology No results found.  Pertinent labs & imaging results that were available during my care of the patient were reviewed by me and considered in my medical decision making (see MDM for details).  Medications Ordered in ED Medications  potassium chloride  SA (KLOR-CON  M) CR tablet 40 mEq (has no administration in time range)  magnesium  oxide (MAG-OX) tablet 800 mg (has no administration in time range)  potassium chloride  10 mEq in 100 mL IVPB (10 mEq Intravenous New Bag/Given 05/16/23 1239)  LORazepam  (ATIVAN ) injection 1 mg (1 mg Intravenous Given 05/16/23 1118)  lactated ringers  bolus 1,000 mL (1,000 mLs Intravenous Bolus 05/16/23 1117)  famotidine  (PEPCID ) IVPB 20 mg premix (0 mg Intravenous Stopped 05/16/23 1210)                                                                                                                                      Procedures .Critical Care  Performed by: Albertina Dixon, MD Authorized by: Albertina Dixon, MD   Critical care provider statement:    Critical care time (minutes):  30   Critical care was necessary to treat or prevent imminent or life-threatening deterioration of the following conditions:  Metabolic crisis and dehydration   Critical care was time spent  personally by me on the following activities:  Development of treatment plan with patient or surrogate, discussions with consultants, evaluation of patient's response to treatment, examination of patient, ordering and review of laboratory studies, ordering and review of radiographic studies, ordering and performing treatments and interventions, pulse oximetry, re-evaluation of patient's condition and review of old charts   (including critical care time)  Medical Decision Making / ED Course   This patient presents to the ED for concern of nausea, vomiting, this involves an extensive number of treatment options, and is a complaint that carries with it a high risk of complications and morbidity.  The differential diagnosis includes food intolerance in the setting of alpha gal, gastroenteritis, obstruction, electrolyte abnormality, dehydration  MDM: Patient seen emergency room for evaluation of abdominal pain nausea and vomiting.  Physical exam largely unremarkable including no appreciable focal motor or sensory deficits in the patient of the hands that patient is describing.  Abdomen is soft and minimally tender in the epigastrium.  Laboratory evaluation with a leukocytosis to 10.7, potassium severely low at 2.5 and was repleted IV and orally, new AKI with BUN 24, creatinine 1.82, calcium  10.4 with an anion gap of 16.  Patient states that she is intolerant to many antiemetics and Ativan  works best for her and thus we did try this.  She had some mild improvement of her nausea and was able to tolerate oral potassium repletion.  However, given  severe electrolyte derangements and new AKI with difficulty tolerating p.o. patient require hospital admission.  Patient admitted   Additional history obtained: -Additional history obtained from husband -External records from outside source obtained and reviewed including: Chart review including previous notes, labs, imaging, consultation notes   Lab Tests: -I ordered, reviewed, and interpreted labs.   The pertinent results include:   Labs Reviewed  COMPREHENSIVE METABOLIC PANEL - Abnormal; Notable for the following components:      Result Value   Potassium 2.5 (*)    Glucose, Bld 137 (*)    BUN 24 (*)    Creatinine, Ser 1.82 (*)    Calcium  10.4 (*)    GFR, Estimated 33 (*)    Anion gap 16 (*)    All other components within normal limits  CBC WITH DIFFERENTIAL/PLATELET - Abnormal; Notable for the following components:   WBC 10.7 (*)    All other components within normal limits  MAGNESIUM        Medicines ordered and prescription drug management: Meds ordered this encounter  Medications   DISCONTD: prochlorperazine  (COMPAZINE ) injection 10 mg   DISCONTD: diphenhydrAMINE  (BENADRYL ) injection 25 mg   DISCONTD: ketorolac  (TORADOL ) 15 MG/ML injection 15 mg   LORazepam  (ATIVAN ) injection 1 mg   lactated ringers  bolus 1,000 mL   famotidine  (PEPCID ) IVPB 20 mg premix   potassium chloride  SA (KLOR-CON  M) CR tablet 40 mEq   magnesium  oxide (MAG-OX) tablet 800 mg   potassium chloride  10 mEq in 100 mL IVPB    -I have reviewed the patients home medicines and have made adjustments as needed  Critical interventions Fluids, potassium and magnesium  repletion    Cardiac Monitoring: The patient was maintained on a cardiac monitor.  I personally viewed and interpreted the cardiac monitored which showed an underlying rhythm of: Sinus tachycardia  Social Determinants of Health:  Factors impacting patients care include: none   Reevaluation: After the interventions noted above, I  reevaluated the patient and found that they have :improved  Co morbidities that complicate the patient evaluation  Past Medical History:  Diagnosis Date   Anemia    Asthma    Depression    GERD (gastroesophageal reflux disease)    History of chicken pox    IBS (irritable bowel syndrome)    Kidney stones    Migraine    Urticaria    UTI (lower urinary tract infection)       Dispostion: I considered admission for this patient, and given severe hypokalemia new AKI patient require hospital admission     Final Clinical Impression(s) / ED Diagnoses Final diagnoses:  None     @PCDICTATION @    Albertina Dixon, MD 05/16/23 1253

## 2023-05-16 NOTE — Assessment & Plan Note (Signed)
-   General Analgesics

## 2023-05-16 NOTE — Hospital Course (Signed)
 Beverly Elliott 53 year old female with extensive history of alpha gal allergy , depression, HTN, HLD, history of IBS, GERD .SABRASABRA Presented to ED with chief complaint of intractable nausea vomiting after exposure to possibly meet-beef contaminated food ingestion. He reports this happened in the past feels similar.  Nuys of having any bilious or bloody vomitus denies any diarrhea.   ED Evaluation: Blood pressure (!) 133/100, pulse 88, temperature 97.6 F (36.4 C), temperature source Oral, resp. rate (!) 23, height 5' 2 (1.575 m), weight 77.1 kg, SpO2 97%.  Labs WBC 10.7, potassium 2.5, BUN 24, creatinine 1.82, calcium  10.4, Meziane 1.8   Patient responded to IV Ativan , with IV fluids, potassium IV and p.o. encouraged  EDP requested patient to be admitted for close observation and IV hydration

## 2023-05-16 NOTE — Assessment & Plan Note (Signed)
-   Extensive history of alpha gal allergy -Patient reports she sees allergist currently taking Omalizumab 150 mg subcu every 28 days -Apparently had an exposure with ingested food-positive nausea vomiting Will monitor closely

## 2023-05-16 NOTE — Assessment & Plan Note (Signed)
-   Will review home medication-not on any BP meds - Continue to monitor

## 2023-05-16 NOTE — Assessment & Plan Note (Signed)
-   Likely due to nausea vomiting, -ED initiated repleting IV and encouraging p.o.

## 2023-05-16 NOTE — Assessment & Plan Note (Signed)
-   Stable continue home medication Wellbutrin

## 2023-05-16 NOTE — Progress Notes (Signed)
   05/16/23 1338  TOC Brief Assessment  Insurance and Status Reviewed  Patient has primary care physician Yes  Home environment has been reviewed Home  Prior/Current Home Services No current home services  Social Drivers of Health Review SDOH reviewed no interventions necessary  Readmission risk has been reviewed Yes  Transition of care needs no transition of care needs at this time   Patient in ED, continuing workup for possible admission. TOC following.

## 2023-05-16 NOTE — ED Triage Notes (Signed)
 Pt in for symptoms following Alpha Gal treatment for tick bite. Symptoms of Nausea, vomiting, back pain, and bilateral arm numbness. VSS pt AAOx4

## 2023-05-16 NOTE — Assessment & Plan Note (Signed)
-  Due to dehydration-continuing hydration, - monitoring BUN/creatinine  - avoiding nephrotoxins Lab Results  Component Value Date   CREATININE 1.82 (H) 05/16/2023   CREATININE 0.99 12/31/2022   CREATININE 1.21 (H) 12/30/2022

## 2023-05-16 NOTE — Assessment & Plan Note (Signed)
 Continue PPI ?

## 2023-05-16 NOTE — Assessment & Plan Note (Signed)
-  Reports not diabetic-Tronic record indicates DM2 -Currently not on any diabetic medication -Will check CBG q. ACHS, and A1c Last A1c 5.9

## 2023-05-17 DIAGNOSIS — R112 Nausea with vomiting, unspecified: Secondary | ICD-10-CM | POA: Diagnosis not present

## 2023-05-17 LAB — CBC
HCT: 38.2 % (ref 36.0–46.0)
Hemoglobin: 12.7 g/dL (ref 12.0–15.0)
MCH: 30.4 pg (ref 26.0–34.0)
MCHC: 33.2 g/dL (ref 30.0–36.0)
MCV: 91.4 fL (ref 80.0–100.0)
Platelets: 241 10*3/uL (ref 150–400)
RBC: 4.18 MIL/uL (ref 3.87–5.11)
RDW: 14.2 % (ref 11.5–15.5)
WBC: 7 10*3/uL (ref 4.0–10.5)
nRBC: 0 % (ref 0.0–0.2)

## 2023-05-17 LAB — BASIC METABOLIC PANEL
Anion gap: 10 (ref 5–15)
BUN: 21 mg/dL — ABNORMAL HIGH (ref 6–20)
CO2: 27 mmol/L (ref 22–32)
Calcium: 9 mg/dL (ref 8.9–10.3)
Chloride: 99 mmol/L (ref 98–111)
Creatinine, Ser: 1.19 mg/dL — ABNORMAL HIGH (ref 0.44–1.00)
GFR, Estimated: 56 mL/min — ABNORMAL LOW (ref >60)
Glucose, Bld: 87 mg/dL (ref 70–99)
Potassium: 2.6 mmol/L — CL (ref 3.5–5.1)
Sodium: 136 mmol/L (ref 135–145)

## 2023-05-17 LAB — MAGNESIUM
Magnesium: 2.2 mg/dL (ref 1.7–2.4)
Magnesium: 2.8 mg/dL — ABNORMAL HIGH (ref 1.7–2.4)

## 2023-05-17 LAB — APTT: aPTT: 28 s (ref 24–36)

## 2023-05-17 LAB — GLUCOSE, CAPILLARY
Glucose-Capillary: 121 mg/dL — ABNORMAL HIGH (ref 70–99)
Glucose-Capillary: 96 mg/dL (ref 70–99)

## 2023-05-17 LAB — HEMOGLOBIN A1C
Hgb A1c MFr Bld: 5.8 % — ABNORMAL HIGH (ref 4.8–5.6)
Mean Plasma Glucose: 120 mg/dL

## 2023-05-17 LAB — PHOSPHORUS
Phosphorus: 3.6 mg/dL (ref 2.5–4.6)
Phosphorus: 3.1 mg/dL (ref 2.5–4.6)

## 2023-05-17 LAB — POTASSIUM: Potassium: 3.2 mmol/L — ABNORMAL LOW (ref 3.5–5.1)

## 2023-05-17 MED ORDER — POTASSIUM & SODIUM PHOSPHATES 280-160-250 MG PO PACK
2.0000 | PACK | ORAL | Status: DC
  Filled 2023-05-17: qty 2

## 2023-05-17 MED ORDER — POTASSIUM CHLORIDE CRYS ER 10 MEQ PO TBCR: 20.0000 meq | EXTENDED_RELEASE_TABLET | Freq: Two times a day (BID) | ORAL | 0 refills | Status: DC

## 2023-05-17 MED ORDER — MAGNESIUM SULFATE 2 GM/50ML IV SOLN
2.0000 g | Freq: Once | INTRAVENOUS | Status: AC
  Administered 2023-05-17: 2 g via INTRAVENOUS
  Filled 2023-05-17: qty 50

## 2023-05-17 MED ORDER — POTASSIUM PHOSPHATES 15 MMOLE/5ML IV SOLN
30.0000 mmol | Freq: Once | INTRAVENOUS | Status: AC
  Administered 2023-05-17: 30 mmol via INTRAVENOUS
  Filled 2023-05-17: qty 10

## 2023-05-17 MED ORDER — POTASSIUM CHLORIDE CRYS ER 20 MEQ PO TBCR
40.0000 meq | EXTENDED_RELEASE_TABLET | Freq: Once | ORAL | Status: AC
  Administered 2023-05-17: 40 meq via ORAL
  Filled 2023-05-17: qty 2

## 2023-05-17 MED ORDER — POTASSIUM PHOSPHATES 15 MMOLE/5ML IV SOLN: 30.0000 mmol | Freq: Once | INTRAVENOUS | Status: DC

## 2023-05-17 NOTE — Progress Notes (Signed)
 Discharged instruction reviewed with patient. IV removed. Declined being wheeled to main entrance x2.

## 2023-05-17 NOTE — Progress Notes (Signed)
Pt tolerated lunch meal well

## 2023-05-17 NOTE — Plan of Care (Signed)
  Problem: Education: Goal: Ability to describe self-care measures that may prevent or decrease complications (Diabetes Survival Skills Education) will improve Outcome: Progressing   Problem: Clinical Measurements: Goal: Ability to maintain clinical measurements within normal limits will improve Outcome: Progressing   Problem: Activity: Goal: Risk for activity intolerance will decrease Outcome: Progressing   Problem: Elimination: Goal: Will not experience complications related to bowel motility Outcome: Progressing Goal: Will not experience complications related to urinary retention Outcome: Progressing   Problem: Safety: Goal: Ability to remain free from injury will improve Outcome: Progressing   Problem: Skin Integrity: Goal: Risk for impaired skin integrity will decrease Outcome: Progressing

## 2023-05-17 NOTE — Discharge Summary (Signed)
 Physician Discharge Summary   Patient: Beverly Elliott MRN: [id number] DOB: 12/02/1970  Admit date:     05/16/2023  Discharge date: 05/17/23  Discharge Physician: Adriana DELENA Grams   PCP: Jesus Elberta Gainer, FNP   Recommendations at discharge:   Follow-up with PCP and allergist and 1-2 weeks. Avoid all allergens  Discharge Diagnoses: Principal Problem:   Intractable nausea and vomiting Active Problems:   AKI (acute kidney injury) (HCC)   Allergy  to alpha-gal   Essential hypertension   Hypokalemia   Anxiety state   Depression   Migraine headache   GERD   Generalized anxiety disorder with panic attacks   Type 2 diabetes mellitus with hyperglycemia (HCC)  Resolved Problems:   * No resolved hospital problems. *  Hospital Course: Beverly Elliott 53 year old female with extensive history of alpha gal allergy , depression, HTN, HLD, history of IBS, GERD .SABRASABRA Presented to ED with chief complaint of intractable nausea vomiting after exposure to possibly meet-beef contaminated food ingestion. He reports this happened in the past feels similar.  Nuys of having any bilious or bloody vomitus denies any diarrhea.   ED Evaluation: Blood pressure (!) 133/100, pulse 88, temperature 97.6 F (36.4 C), temperature source Oral, resp. rate (!) 23, height 5' 2 (1.575 m), weight 77.1 kg, SpO2 97%.  Labs WBC 10.7, potassium 2.5, BUN 24, creatinine 1.82, calcium  10.4, Meziane 1.8   Patient responded to IV Ativan , with IV fluids, potassium IV and p.o. encouraged  EDP requested patient to be admitted for close observation and IV hydration    * Intractable nausea and vomiting - Apparently patient has an alpha gal allergies, -Reported ingestion of cross-contamination with beef products -Improved nausea vomiting, responded well to IV fluids, - Altering p.o.... Dancing -Antiemetics she responds well to Ativan    Allergy  to alpha-gal - Extensive history of alpha gal allergy  -Patient reports  she sees allergist currently taking Omalizumab  150 mg subcu every 28 days -Apparently had an exposure with ingested food-positive nausea vomiting Will monitor closely  AKI (acute kidney injury) (HCC) -Due to dehydration-continuing hydration, - monitoring BUN/creatinine  - avoiding nephrotoxins Lab Results  Component Value Date   CREATININE 1.19 (H) 05/17/2023   CREATININE 1.82 (H) 05/16/2023   CREATININE 0.99 12/31/2022     Essential hypertension - Will review home medication-not on any BP meds - Continue to monitor  Hypokalemia - Likely due to nausea vomiting, -ED initiated repleting IV and encouraging p.o.  Type 2 diabetes mellitus with hyperglycemia (HCC) -Diet controlled, carb modified diet Last A1c 5.9  Generalized anxiety disorder with panic attacks Stable  GERD - Continue PPI  Migraine headache - General Analgesics  Depression - Stable continue home medication Wellbutrin   Anxiety state - Continue home medication,    Disposition: Home Diet recommendation:  Discharge Diet Orders (From admission, onward)     Start     Ordered   05/17/23 0000  Diet - low sodium heart healthy        05/17/23 1042           Carb modified diet DISCHARGE MEDICATION: Allergies as of 05/17/2023       Reactions   Latex Swelling   Metoclopramide  Nausea And Vomiting   Vomited for 8 hours   Alpha-gal    Pork, beef, dairy, gelatin   Heparin     Metoclopramide  Hcl Nausea And Vomiting   Vomited for 8 hours   Tape Rash   Adhesive tape        Medication List  STOP taking these medications    hydrochlorothiazide  12.5 MG tablet Commonly known as: HYDRODIURIL        TAKE these medications    amLODipine  10 MG tablet Commonly known as: NORVASC  Take 10 mg by mouth daily.   buPROPion  300 MG 24 hr tablet Commonly known as: WELLBUTRIN  XL Take 300 mg by mouth daily.   dicyclomine  20 MG tablet Commonly known as: BENTYL  Take 20 mg by mouth as needed for  spasms.   EPINEPHrine  0.3 mg/0.3 mL Soaj injection Commonly known as: EPI-PEN Inject 0.3 mg into the muscle as needed for anaphylaxis.   escitalopram  20 MG tablet Commonly known as: LEXAPRO  Take 20 mg by mouth daily.   magnesium  oxide 400 MG tablet Commonly known as: MAG-OX Take by mouth.   ondansetron  4 MG disintegrating tablet Commonly known as: ZOFRAN -ODT Take 1 tablet (4 mg total) by mouth every 8 (eight) hours as needed for nausea or vomiting.   pantoprazole  40 MG tablet Commonly known as: PROTONIX  Take 1 tablet by mouth daily.   potassium chloride  10 MEQ tablet Commonly known as: KLOR-CON  M Take 2 tablets (20 mEq total) by mouth 2 (two) times daily for 2 doses.   Xolair  150 MG/ML prefilled syringe Generic drug: omalizumab         Discharge Exam: Filed Weights   05/16/23 1019 05/17/23 0500  Weight: 77.1 kg 79.7 kg        General:  AAO x 3,  cooperative, no distress;   HEENT:  Normocephalic, PERRL, otherwise with in Normal limits   Neuro:  CNII-XII intact. , normal motor and sensation, reflexes intact   Lungs:   Clear to auscultation BL, Respirations unlabored,  No wheezes / crackles  Cardio:    S1/S2, RRR, No murmure, No Rubs or Gallops   Abdomen:  Soft, non-tender, bowel sounds active all four quadrants, no guarding or peritoneal signs.  Muscular  skeletal:  Limited exam -global generalized weaknesses - in bed, able to move all 4 extremities,   2+ pulses,  symmetric, No pitting edema  Skin:  Dry, warm to touch, negative for any Rashes,  Wounds: Please see nursing documentation          Condition at discharge: good  The results of significant diagnostics from this hospitalization (including imaging, microbiology, ancillary and laboratory) are listed below for reference.   Imaging Studies: No results found.  Microbiology: Results for orders placed or performed during the hospital encounter of 12/29/22  MRSA Next Gen by PCR, Nasal     Status:  None   Collection Time: 12/30/22 12:35 AM   Specimen: Nasal Mucosa; Nasal Swab  Result Value Ref Range Status   MRSA by PCR Next Gen NOT DETECTED NOT DETECTED Final    Comment: (NOTE) The GeneXpert MRSA Assay (FDA approved for NASAL specimens only), is one component of a comprehensive MRSA colonization surveillance program. It is not intended to diagnose MRSA infection nor to guide or monitor treatment for MRSA infections. Test performance is not FDA approved in patients less than 29 years old. Performed at [facility], [address], [place], [place] [address]     Labs: CBC: Recent Labs  Lab 05/16/23 1058 05/17/23 0413  WBC 10.7* 7.0  NEUTROABS 7.1  --   HGB 14.6 12.7  HCT 43.3 38.2  MCV 86.9 91.4  PLT 365 241   Basic Metabolic Panel: Recent Labs  Lab 05/16/23 1058 05/17/23 0413  NA 139 136  K 2.5* 2.6*  CL 98 99  CO2 25 27  GLUCOSE 137* 87  BUN 24* 21*  CREATININE 1.82* 1.19*  CALCIUM  10.4* 9.0  MG 1.8 2.2  PHOS 1.1* 3.6   Liver Function Tests: Recent Labs  Lab 05/16/23 1058  AST 22  ALT 15  ALKPHOS 63  BILITOT 0.7  PROT 8.1  ALBUMIN 4.7   CBG: Recent Labs  Lab 05/16/23 2224 05/17/23 0751  GLUCAP 93 121*    Discharge time spent: greater than 30 minutes.  Signed: Adriana DELENA Grams, MD [facility] 05/17/2023

## 2023-05-20 ENCOUNTER — Ambulatory Visit: Payer: BC Managed Care – PPO | Admitting: Internal Medicine

## 2023-05-30 ENCOUNTER — Ambulatory Visit: Payer: BC Managed Care – PPO

## 2023-05-31 ENCOUNTER — Emergency Department (HOSPITAL_COMMUNITY): Payer: BC Managed Care – PPO

## 2023-05-31 ENCOUNTER — Encounter (HOSPITAL_COMMUNITY): Payer: Self-pay

## 2023-05-31 ENCOUNTER — Other Ambulatory Visit: Payer: Self-pay

## 2023-05-31 ENCOUNTER — Emergency Department (HOSPITAL_COMMUNITY)
Admission: EM | Admit: 2023-05-31 | Discharge: 2023-05-31 | Disposition: A | Discharge status: Home / Self Care | Payer: BC Managed Care – PPO | Attending: Student | Admitting: Student

## 2023-05-31 DIAGNOSIS — Z87891 Personal history of nicotine dependence: Secondary | ICD-10-CM | POA: Insufficient documentation

## 2023-05-31 DIAGNOSIS — J45909 Unspecified asthma, uncomplicated: Secondary | ICD-10-CM | POA: Diagnosis not present

## 2023-05-31 DIAGNOSIS — M25551 Pain in right hip: Secondary | ICD-10-CM | POA: Insufficient documentation

## 2023-05-31 DIAGNOSIS — Z91040 Latex allergy status: Secondary | ICD-10-CM | POA: Diagnosis not present

## 2023-05-31 MED ORDER — KETOROLAC TROMETHAMINE 15 MG/ML IJ SOLN
15.0000 mg | Freq: Once | INTRAMUSCULAR | Status: AC
  Administered 2023-05-31: 15 mg via INTRAMUSCULAR

## 2023-05-31 MED ORDER — NAPROXEN 375 MG PO TABS: 375.0000 mg | ORAL_TABLET | Freq: Two times a day (BID) | ORAL | 0 refills | Status: DC

## 2023-05-31 MED ORDER — KETOROLAC TROMETHAMINE 15 MG/ML IJ SOLN
INTRAMUSCULAR | Status: AC
  Filled 2023-05-31: qty 1

## 2023-05-31 MED ORDER — LIDOCAINE 5 % EX PTCH
1.0000 | MEDICATED_PATCH | CUTANEOUS | Status: DC
  Administered 2023-05-31: 1 via TRANSDERMAL
  Filled 2023-05-31: qty 1

## 2023-05-31 NOTE — ED Provider Notes (Addendum)
[place] EMERGENCY DEPARTMENT AT [facility] Provider Note  CSN: [id number] Arrival date & time: 05/31/23 1907  Chief Complaint(s) Hip Pain  HPI Beverly Elliott is a 53 y.o. female who presents emergency room for evaluation of right hip pain.  Patient states that 5 days ago she tripped and suffered a fall onto her right hip.  She states that she has been able to ambulate on the leg but has intermittent severe pain to the lateral aspect of the right hip and in the groin when the leg is moved in certain positions.  Denies numbness, tingling, weakness to lower extremity.  Denies chest pain, shortness of breath, headache, fever or other systemic symptoms.  Denies head strike or loss of consciousness or other traumatic implants.   Past Medical History Past Medical History:  Diagnosis Date   Anemia    Asthma    Depression    GERD (gastroesophageal reflux disease)    History of chicken pox    IBS (irritable bowel syndrome)    Kidney stones    Migraine    Urticaria    UTI (lower urinary tract infection)    Patient Active Problem List   Diagnosis Date Noted   Allergy to alpha-gal 05/16/2023   Intractable nausea and vomiting 07/20/2022   Type 2 diabetes mellitus with hyperglycemia (HCC) 07/20/2022   Mixed hyperlipidemia 07/20/2022   Obesity (BMI 30-39.9) 07/20/2022   Leukocytosis 01/11/2022   AKI (acute kidney injury) (HCC) 11/19/2021   Hypokalemia 11/19/2021   Prolonged QT interval 11/19/2021   Excessive daytime sleepiness 01/06/2021   Obstructive sleep apnea 01/06/2021   Cyclical vomiting with nausea 09/20/2019   Severe obesity (BMI 35.0-39.9) with comorbidity (HCC) 06/14/2019   Arthritis 03/14/2019   Essential hypertension 07/24/2018   Generalized anxiety disorder with panic attacks 03/14/2017   DDD (degenerative disc disease), lumbosacral 01/12/2017   Fatty liver 01/12/2017   Seasonal allergic rhinitis due to pollen 01/12/2017   Urinary urgency 01/12/2017   Soft  tissue mass 06/17/2014   Ganglion cyst of flexor tendon sheath of finger of right hand 06/17/2014   Decreased libido 09/20/2013   Other malaise and fatigue 09/20/2013   Sinusitis, acute maxillary 08/01/2013   Asthmatic bronchitis with exacerbation 01/19/2013   Skin tag 10/04/2012   Breast mass 12/17/2010   Candidiasis 05/27/2010   Anxiety state 03/30/2010   RESTLESS LEGS SYNDROME 03/30/2010   IBS 03/30/2010   ANEMIA-NOS 03/18/2010   Depression 03/18/2010   Migraine headache 03/18/2010   GERD 03/18/2010   Radiculopathy, lumbosacral region 03/18/2010   Recurrent major depressive disorder, in full remission (HCC) 03/18/2010   Home Medication(s) Prior to Admission medications   Medication Sig Start Date End Date Taking? Authorizing Provider  amLODipine (NORVASC) 10 MG tablet Take 10 mg by mouth daily. 02/22/23   [provider]  buPROPion (WELLBUTRIN XL) 300 MG 24 hr tablet Take 300 mg by mouth daily. 09/07/21   [provider]  dicyclomine (BENTYL) 20 MG tablet Take 20 mg by mouth as needed for spasms. 08/24/22   [provider]  EPINEPHrine 0.3 mg/0.3 mL IJ SOAJ injection Inject 0.3 mg into the muscle as needed for anaphylaxis. 12/24/22   Ferol Luz, MD  escitalopram (LEXAPRO) 20 MG tablet Take 20 mg by mouth daily. 02/19/23   [provider]  magnesium oxide (MAG-OX) 400 MG tablet Take by mouth. 05/12/23   [provider]  ondansetron (ZOFRAN-ODT) 4 MG disintegrating tablet Take 1 tablet (4 mg total) by mouth every 8 (  eight) hours as needed for nausea or vomiting. 12/31/22   Vassie Loll, MD  pantoprazole (PROTONIX) 40 MG tablet Take 1 tablet by mouth daily. 11/03/22   [provider]  potassium chloride (KLOR-CON M) 10 MEQ tablet Take 2 tablets (20 mEq total) by mouth 2 (two) times daily for 2 doses. 05/17/23 05/18/23  Kendell Bane, MD  Geoffry Paradise 150 MG/ML prefilled syringe  03/28/23   [provider]                                                                                                                                     Past Surgical History Past Surgical History:  Procedure Laterality Date   ABDOMINAL HYSTERECTOMY     ADENOIDECTOMY     CESAREAN SECTION     TONSILLECTOMY     Family History Family History  Problem Relation Age of Onset   Coronary artery disease Mother    Hypertension Mother    Allergic rhinitis Father    Coronary artery disease Father    Heart attack Father    Allergic rhinitis Sister    Diabetes Maternal Grandfather    Hypertension Paternal Grandmother    Asthma Neg Hx    Eczema Neg Hx    Urticaria Neg Hx     Social History Social History   Tobacco Use   Smoking status: Former    Current packs/day: 0.80    Types: Cigarettes   Smokeless tobacco: Never  Vaping Use   Vaping status: Never Used  Substance Use Topics   Alcohol use: Not Currently    Comment: rarely   Drug use: Yes    Types: Marijuana    Comment: last smoked marijuana a couple of days ago   Allergies Latex, Metoclopramide, Alpha-gal, Heparin, Metoclopramide hcl, and Tape  Review of Systems Review of Systems  Musculoskeletal:  Positive for arthralgias.    Physical Exam Vital Signs  I have reviewed the triage vital signs BP 122/77   Pulse (!) 105   Temp 98.6 F (37 C) (Oral)   Resp 18   Ht 5\' 2"  (1.575 m)   Wt 81.2 kg   SpO2 95%   BMI 32.74 kg/m   Physical Exam Vitals and nursing note reviewed.  Constitutional:      General: She is not in acute distress.    Appearance: She is well-developed.  HENT:     Head: Normocephalic and atraumatic.  Eyes:     Conjunctiva/sclera: Conjunctivae normal.  Cardiovascular:     Rate and Rhythm: Normal rate and regular rhythm.     Heart sounds: No murmur heard. Pulmonary:     Effort: Pulmonary effort is normal. No respiratory distress.     Breath sounds: Normal breath sounds.  Abdominal:     Palpations: Abdomen is soft.     Tenderness: There  is no abdominal tenderness.  Musculoskeletal:        General:  Tenderness present. No swelling.     Cervical back: Neck supple.  Skin:    General: Skin is warm and dry.     Capillary Refill: Capillary refill takes less than 2 seconds.  Neurological:     Mental Status: She is alert.  Psychiatric:        Mood and Affect: Mood normal.     ED Results and Treatments Labs (all labs ordered are listed, but only abnormal results are displayed) Labs Reviewed - No data to display                                                                                                                        Radiology DG Hip Unilat  With Pelvis 2-3 Views Right Result Date: 05/31/2023 CLINICAL DATA:  Pain after injury EXAM: DG HIP (WITH OR WITHOUT PELVIS) 3V RIGHT COMPARISON:  Abdomen pelvis CT 12/29/2022. FINDINGS: No fracture or dislocation. Preserved joint spaces. Minimal degenerative changes of the sacroiliac joints inferiorly. There is sclerotic focus along the left intertrochanteric region of the hip, unchanged from an old CT scan, bone island. Preserved bone mineralization. IMPRESSION: Slight degenerative changes. Electronically Signed   By: Karen Kays M.D.   On: 05/31/2023 19:59    Pertinent labs & imaging results that were available during my care of the patient were reviewed by me and considered in my medical decision making (see MDM for details).  Medications Ordered in ED Medications  lidocaine (LIDODERM) 5 % 1 patch (1 patch Transdermal Patch Applied 05/31/23 2150)  ketorolac (TORADOL) 15 MG/ML injection 15 mg (15 mg Intramuscular Not Given 05/31/23 2220)                                                                                                                                     Procedures Procedures  (including critical care time)  Medical Decision Making / ED Course   This patient presents to the ED for concern of hip pain, this involves an extensive number of treatment options,  and is a complaint that carries with it a high risk of complications and morbidity.  The differential diagnosis includes fracture, hematoma, ligamentous injury, contusion  MDM: Patient seen in the emergency room for evaluation of a fall with right hip pain.  Physical exam with tenderness on the lateral aspect of the right hip but is otherwise unremarkable.  Neurologic exam unremarkable.  Initial x-ray imaging  is negative.  Despite Toradol, patient with persistent pain on ambulation and will obtain a CT pelvis to rule out occult fracture which is reassuringly negative.  At this time she does not meet inpatient criteria for admission and will be discharged with outpatient follow-up.  Additional history obtained: -Additional history obtained from husband -External records from outside source obtained and reviewed including: Chart review including previous notes, labs, imaging, consultation notes      Imaging Studies ordered: I ordered imaging studies including x-ray hip I independently visualized and interpreted imaging. I agree with the radiologist interpretation  CT pelvis pending   Medicines ordered and prescription drug management: Meds ordered this encounter  Medications   lidocaine (LIDODERM) 5 % 1 patch   ketorolac (TORADOL) 15 MG/ML injection 15 mg   ketorolac (TORADOL) 15 MG/ML injection    [place], Alyc: cabinet override    -I have reviewed the patients home medicines and have made adjustments as needed  Critical interventions none    Social Determinants of Health:  Factors impacting patients care include: none   Reevaluation: After the interventions noted above, I reevaluated the patient and found that they have :stayed the same  Co morbidities that complicate the patient evaluation  Past Medical History:  Diagnosis Date   Anemia    Asthma    Depression    GERD (gastroesophageal reflux disease)    History of chicken pox    IBS (irritable bowel  syndrome)    Kidney stones    Migraine    Urticaria    UTI (lower urinary tract infection)       Dispostion: I considered admission for this patient, but at this time she does not meet inpatient criteria for admission and will be discharged with outpatient follow-up     Final Clinical Impression(s) / ED Diagnoses Final diagnoses:  None     @PCDICTATION @    Glendora Score, MD 05/31/23 2306    Glendora Score, MD 05/31/23 2330

## 2023-05-31 NOTE — ED Triage Notes (Signed)
Pt reports she fell 1 week ago when she slipped and still has right side hip pain.

## 2023-05-31 NOTE — ED Notes (Signed)
Patient transported to CT 

## 2023-06-06 ENCOUNTER — Ambulatory Visit: Payer: BC Managed Care – PPO

## 2023-06-10 ENCOUNTER — Encounter: Payer: Self-pay | Admitting: Internal Medicine

## 2023-06-17 ENCOUNTER — Ambulatory Visit: Payer: BC Managed Care – PPO | Admitting: Internal Medicine

## 2023-06-17 ENCOUNTER — Encounter: Payer: Self-pay | Admitting: Internal Medicine

## 2023-06-17 ENCOUNTER — Telehealth: Payer: Self-pay | Admitting: *Deleted

## 2023-06-17 ENCOUNTER — Other Ambulatory Visit: Payer: Self-pay

## 2023-06-17 VITALS — BP 110/78 | HR 66 | Temp 98.0°F | Resp 18

## 2023-06-17 DIAGNOSIS — Z91018 Allergy to other foods: Secondary | ICD-10-CM | POA: Diagnosis not present

## 2023-06-17 DIAGNOSIS — J302 Other seasonal allergic rhinitis: Secondary | ICD-10-CM

## 2023-06-17 DIAGNOSIS — J3089 Other allergic rhinitis: Secondary | ICD-10-CM | POA: Diagnosis not present

## 2023-06-17 DIAGNOSIS — H1045 Other chronic allergic conjunctivitis: Secondary | ICD-10-CM | POA: Diagnosis not present

## 2023-06-17 MED ORDER — OMALIZUMAB 300 MG/2  ML ~~LOC~~ SOSY
300.0000 mg | PREFILLED_SYRINGE | SUBCUTANEOUS | Status: DC
  Administered 2023-06-17 – 2023-08-12 (×3): 300 mg via SUBCUTANEOUS

## 2023-06-17 NOTE — Progress Notes (Signed)
 FOLLOW UP Date of Service/Encounter:  06/17/23  Subjective:  Beverly Elliott (DOB: [birth year]) is a 53 y.o. female who [facility] in re-evaluation of the following: alpha gal and xolair  reapproval.  History obtained from: chart review and patient.  For Review, LV was on 12/24/22  with Dr. Lorin seen for intial visit for alpha gal  . See below for summary of history and diagnostics.   Therapeutic plans/changes recommended: xolair  started at last visit   Hospital dc 05/17/23:  Intractable nausea and vomiting - Apparently patient has an alpha gal allergies, -Reported ingestion of cross-contamination with beef products -Improved nausea vomiting, responded well to IV fluids, - Altering p.o.... Dancing -Antiemetics she responds well to Ativan      Allergy  to alpha-gal - Extensive history of alpha gal allergy  -Patient reports she sees allergist currently taking Omalizumab  150 mg subcu every 28 days -Apparently had an exposure with ingested food-positive nausea vomiting Will monitor closely   AKI (acute kidney injury) (HCC) -Due to dehydration-continuing hydration, - monitoring BUN/creatinine  - avoiding nephrotoxins Today presents for follow-up. Discussed the use of AI scribe software for clinical note transcription with the patient, who gave verbal consent to proceed.  History of Present Illness   Beverly Elliott is a 53 year old female with allergic rhinitis and alpha-gal syndrome who presents for Xolair  reapproval.  Last xolair  injection in December.  Previously on 150mg  dose.[name] Despite initial concerns about Xolair  due to her alpha-gal syndrome, she has tolerated the injections well without any side effects. She believes Xolair  has been beneficial, particularly in managing symptoms related to accidental dairy ingestion. She recounted an incident involving non-plant-based ranch dressing where she did not experience adverse reactions,  attributing this to Xolair .  She has a history of alpha-gal syndrome and was recently hospitalized for nausea and vomiting in January, which she attributes to cross-contamination after dining out. She reported that the restaurant did not comply with her request to avoid cooking her food with beef or pork, leading to her illness.  She experiences symptoms of allergic rhinitis, including runny and stuffy nose, and itchy, watery eyes, particularly during this time of year. She also reports increased phlegm production, which she attributes to the lack of effective over-the-counter medication due to her alpha-gal syndrome. Previously, she used Mucinex nightly before her diagnosis but now finds no suitable alternatives. She currently uses Nasonex , which is effective when used daily.  She is aware of vegan meds website.         All medications reviewed by clinical staff and updated in chart. No new pertinent medical or surgical history except as noted in HPI.  ROS: All others negative except as noted per HPI.   Objective:  BP 110/78 (BP Location: Right Arm, Patient Position: Sitting, Cuff Size: Large)   Pulse 66   Temp 98 F (36.7 C) (Temporal)   Resp 18   SpO2 97%  There is no height or weight on file to calculate BMI. Physical Exam: General Appearance:  Alert, cooperative, no distress, appears stated age  Head:  Normocephalic, without obvious abnormality, atraumatic  Eyes:  Conjunctiva clear, EOM's intact  Ears EACs normal bilaterally  Nose: Nares normal,  erythematous nasal mucosa, no visible anterior polyps, and septum midline  Throat: Lips, tongue normal; teeth and gums normal, + cobblestoning  Neck: Supple, symmetrical  Lungs:   clear to auscultation bilaterally, Respirations unlabored, no coughing  Heart:  regular rate and rhythm and no murmur,  Appears well perfused  Extremities: No edema  Skin: Skin color, texture, turgor normal and no rashes or lesions on visualized portions of  skin  Neurologic: No gross deficits   Labs:  Lab Orders  No laboratory test(s) ordered today     Assessment/Plan   Food allergy :  - please strictly avoid gelatin, milk products, mammalian meat   - for SKIN only reaction, okay to take Allegra 180mg  24 hours (gelatin free)   every 12 hours - for SKIN + ANY additional symptoms, OR IF concern for LIFE THREATENING reaction = Epipen  Autoinjector EpiPen  0.3 mg. - If using Epinephrine  autoinjector, call 911 - A food allergy  action plan has been provided and discussed. - Medic Alert identification is recommended. - Will continue xolair , but increase dose to 300mg  every 4 weeks  - Can use www.veganmeds.com to verify non-gelatin containing   Chronic Rhinitis Seasonal and Perennial Allergic: - allergy  testing today: grass, weed, tree, mold, dog   - Prevention:  - allergen avoidance when possible - consider allergy  shots as long term control of your symptoms by teaching your immune system to be more tolerant of your allergy  triggers  - Symptom control: - Consider Nasal  Steroid Spray: nasonex  1 spray per nostril twice daily  - Continue Antihistamine: daily or daily as needed.   -Options include Zyrtec  (Cetirizine ) 10mg , Claritin (Loratadine) 10mg , Allegra (Fexofenadine) 180mg , or Xyzal (Levocetirinze) 5mg  - Can be purchased over-the-counter if not covered by insurance.  Follow up: 6 months   Other:     Thank you so much for letting me partake in your care today.  Don't hesitate to reach out if you have any additional concerns!  Hargis Springer, MD  [facility]- [place], [place]

## 2023-06-17 NOTE — Telephone Encounter (Signed)
 Patient is up dosing to 300mg  Xolair  per Dr. Jolayne Natter for management of her Alpha Gal. Pt received sample in office today of 300mg  per Dr. Jolayne Natter since she just had a recent hospitalization.

## 2023-06-17 NOTE — Patient Instructions (Signed)
 Food allergy :  - please strictly avoid gelatin, milk products, mammalian meat   - for SKIN only reaction, okay to take Allegra 180mg  24 hours (gelatin free)   every  12  hours - for SKIN + ANY additional symptoms, OR IF concern for LIFE THREATENING reaction = Epipen  Autoinjector EpiPen  0.3 mg. - If using Epinephrine  autoinjector, call 911 - A food allergy  action plan has been provided and discussed. - Medic Alert identification is recommended. - Will continue xolair , but increase dose to 300mg  every 4 weeks  - Can use www.veganmeds.com to verify non-gelatin containing   Chronic Rhinitis Seasonal and Perennial Allergic: - allergy  testing today: grass, weed, tree, mold, dog   - Prevention:  - allergen avoidance when possible - consider allergy  shots as long term control of your symptoms by teaching your immune system to be more tolerant of your allergy  triggers  - Symptom control: - Consider Nasal  Steroid Spray: nasonex  1 spray per nostril twice daily  - Continue Antihistamine: daily or daily as needed.   -Options include Zyrtec  (Cetirizine ) 10mg , Claritin (Loratadine) 10mg , Allegra (Fexofenadine) 180mg , or Xyzal (Levocetirinze) 5mg  - Can be purchased over-the-counter if not covered by insurance.  Follow up: 6 months   Thank you so much for letting me partake in your care today.  Don't hesitate to reach out if you have any additional concerns!  Hargis Springer, MD  [facility]- [place], [place]

## 2023-06-20 MED ORDER — XOLAIR 150 MG/ML ~~LOC~~ SOSY: 300.0000 mg | PREFILLED_SYRINGE | SUBCUTANEOUS | 11 refills | Status: AC

## 2023-06-20 NOTE — Addendum Note (Signed)
 Addended by: Evangelina Hilt on: 06/20/2023 02:21 PM   Modules accepted: Orders

## 2023-06-20 NOTE — Telephone Encounter (Signed)
 L/m for patient with next shipment will increase to 300mg  Xolair  I have gotten approval for same and sent in new Rx to [facility]

## 2023-06-21 ENCOUNTER — Other Ambulatory Visit (HOSPITAL_COMMUNITY): Payer: Self-pay | Admitting: Family Medicine

## 2023-06-21 ENCOUNTER — Ambulatory Visit (HOSPITAL_COMMUNITY)
Admission: RE | Admit: 2023-06-21 | Discharge: 2023-06-21 | Disposition: A | Discharge status: Home / Self Care | Payer: BC Managed Care – PPO | Source: Ambulatory Visit | Attending: Family Medicine | Admitting: Family Medicine

## 2023-06-21 DIAGNOSIS — R6884 Jaw pain: Secondary | ICD-10-CM | POA: Insufficient documentation

## 2023-06-27 ENCOUNTER — Other Ambulatory Visit: Payer: Self-pay

## 2023-06-27 ENCOUNTER — Emergency Department (HOSPITAL_COMMUNITY)
Admission: EM | Admit: 2023-06-27 | Discharge: 2023-06-27 | Disposition: A | Discharge status: Home / Self Care | Payer: BC Managed Care – PPO | Attending: Emergency Medicine | Admitting: Emergency Medicine

## 2023-06-27 ENCOUNTER — Ambulatory Visit: Payer: BC Managed Care – PPO

## 2023-06-27 ENCOUNTER — Encounter (HOSPITAL_COMMUNITY): Payer: Self-pay

## 2023-06-27 DIAGNOSIS — M26621 Arthralgia of right temporomandibular joint: Secondary | ICD-10-CM | POA: Insufficient documentation

## 2023-06-27 DIAGNOSIS — Z91040 Latex allergy status: Secondary | ICD-10-CM | POA: Diagnosis not present

## 2023-06-27 DIAGNOSIS — R6884 Jaw pain: Secondary | ICD-10-CM | POA: Diagnosis present

## 2023-06-27 MED ORDER — KETOROLAC TROMETHAMINE 60 MG/2ML IM SOLN
15.0000 mg | Freq: Once | INTRAMUSCULAR | Status: AC
  Administered 2023-06-27: 15 mg via INTRAMUSCULAR
  Filled 2023-06-27: qty 2

## 2023-06-27 NOTE — Discharge Instructions (Signed)
 Please follow-up with your primary care provider in regards to recent symptoms and ER visit.  Today your physical exam is reassuring most likely have TMJ pain.  You are given shot pain meds here and so for the next few days please use Tylenol every 6 hours needed for pain and ice your jaw 3-4 times daily for 15 minutes at a time.  Please follow-up on your results of the mandible x-ray.  Please return to the ER if symptoms change or worsen.

## 2023-06-27 NOTE — ED Provider Notes (Signed)
 [place] EMERGENCY DEPARTMENT AT [facility] Provider Note   CSN: [id number] Arrival date & time: 06/27/23  1410     History  Chief Complaint  Patient presents with   Jaw Pain    Beverly Elliott is a 53 y.o. 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  Patient states that her alpha gal was exacerbated she had a emesis which caused her TMJ to act up.  Patient be able to eat and drink and articulate words.  Patient went to her primary care provider had mandibular x-ray done however has not heard back.  Patient states the pain radiates to her ear but denies any hearing changes.  Patient does not have any neck stiffness and states she is able to swallow without issue.  Patient takes tizanidine and naproxen which usually helps however she is still having discomfort.    Home Medications Prior to Admission medications   Medication Sig Start Date End Date Taking? Authorizing Provider  amLODipine (NORVASC) 10 MG tablet Take 10 mg by mouth daily. 02/22/23   [provider]  buPROPion (WELLBUTRIN XL) 300 MG 24 hr tablet Take 300 mg by mouth daily. 09/07/21   [provider]  dicyclomine (BENTYL) 20 MG tablet Take 20 mg by mouth as needed for spasms. Patient not taking: Reported on 06/17/2023 08/24/22   [provider]  EPINEPHrine 0.3 mg/0.3 mL IJ SOAJ injection Inject 0.3 mg into the muscle as needed for anaphylaxis. 12/24/22   Ferol Luz, MD  escitalopram (LEXAPRO) 20 MG tablet Take 20 mg by mouth daily. 02/19/23   [provider]  magnesium oxide (MAG-OX) 400 MG tablet Take by mouth. 05/12/23   [provider]  naproxen (NAPROSYN) 375 MG tablet Take 1 tablet (375 mg total) by mouth 2 (two) times daily. Patient not taking: Reported on 06/17/2023 05/31/23   Kommor, Wyn Forster, MD  ondansetron (ZOFRAN-ODT) 4 MG disintegrating tablet Take 1 tablet (4 mg total) by mouth every 8 (eight) hours as needed for nausea  or vomiting. 12/31/22   Vassie Loll, MD  pantoprazole (PROTONIX) 40 MG tablet Take 1 tablet by mouth daily. 11/03/22   [provider]  potassium chloride (KLOR-CON M) 10 MEQ tablet Take 2 tablets (20 mEq total) by mouth 2 (two) times daily for 2 doses. 05/17/23 05/18/23  Kendell Bane, MD  XOLAIR 150 MG/ML prefilled syringe Inject 300 mg into the skin every 28 (twenty-eight) days. 06/20/23   Ferol Luz, MD      Allergies    Latex, Metoclopramide, Alpha-gal, Heparin, Metoclopramide hcl, and Tape    Review of Systems   Review of Systems  Physical Exam Updated Vital Signs BP 129/88   Pulse 65   Temp 98.4 F (36.9 C) (Oral)   Resp 18   Ht 5\' 2"  (1.575 m)   Wt 81.2 kg   SpO2 100%   BMI 32.74 kg/m  Physical Exam Constitutional:      General: She is not in acute distress. HENT:     Head: Normocephalic and atraumatic.     Right Ear: Tympanic membrane, ear canal and external ear normal.     Mouth/Throat:     Mouth: Mucous membranes are moist.     Comments: Grinding noted on the right TMJ Negative bite test Mandible nontender to palpation without bony step-off Full active range of motion in jaw and able to articulate words without difficulty Tolerating secretions Neurological:     Mental  Status: She is alert.     ED Results / Procedures / Treatments   Labs (all labs ordered are listed, but only abnormal results are displayed) Labs Reviewed - No data to display  EKG None  Radiology No results found.  Procedures Procedures    Medications Ordered in ED Medications  ketorolac (TORADOL) injection 15 mg (15 mg Intramuscular Given 06/27/23 1710)    ED Course/ Medical Decision Making/ A&P                                 Medical Decision Making Risk Prescription drug management.   [name] 53 y.o. presented today for right jaw pain. Working DDx that I considered at this time includes, but not limited to, TMJ, dislocation, mandibular fracture,  OE, AOM.  R/o DDx: dislocation, mandibular fracture, OE, AOM: These are considered less likely due to history of present illness, physical exam, labs/imaging findings  Review of prior external notes: 06/21/2023 office visit  Unique Tests and My Independent Interpretation: None  Social Determinants of Health: none  Discussion with Independent Historian: None  Discussion of Management of Tests: None  Risk: Medium: prescription drug management  Risk Stratification Score: None  Plan: On exam patient was in no acute distress with stable vitals.  Patient's exam does show some grinding on the right TMJ but otherwise was unremarkable.  Patient is negative bite test and so low suspicion of mandibular fracture.  Patient's ear also was reassuring as well.  Patient's been able to eat and drink without issue and is articulating words so very low suspicion of mandibular fracture or dislocation.  Upon my independent interpretation of the mandibular x-ray done by primary care provider I do not see any obvious deformities however encouraged patient to follow-up with his results on the MyChart app as the radiologist may see something different.  Patient be given a shot of Toradol here and asked to follow-up with her primary care provider and to monitor symptoms.  Encouraged her to use Tylenol next few days and to ice her jaw to see if this helps.  Patient is already on a soft diet and I encouraged her to continue doing this to allow her jaw to heal.  Patient was given return precautions. Patient stable for discharge at this time.  Patient verbalized understanding of plan.  This chart was dictated using voice recognition software.  Despite best efforts to proofread,  errors can occur which can change the documentation meaning.         Final Clinical Impression(s) / ED Diagnoses Final diagnoses:  Arthralgia of right temporomandibular joint    Rx / DC Orders ED Discharge Orders     None          Remi Deter 06/27/23 1721    Bethann Berkshire, MD 06/29/23 [phone]

## 2023-06-27 NOTE — ED Triage Notes (Signed)
 Pt arrived via POV from home c/o jaw pain. Pt reports coughing aggressively a week ago and reports hearing a "pop" in her right jaw. Pt recently had a mandible xray performed 6 days ago and reports she has not heard the results yet.

## 2023-07-15 ENCOUNTER — Ambulatory Visit: Payer: BC Managed Care – PPO | Admitting: *Deleted

## 2023-07-15 DIAGNOSIS — Z91018 Allergy to other foods: Secondary | ICD-10-CM

## 2023-08-12 ENCOUNTER — Ambulatory Visit (INDEPENDENT_AMBULATORY_CARE_PROVIDER_SITE_OTHER): Admitting: *Deleted

## 2023-08-12 DIAGNOSIS — Z91018 Allergy to other foods: Secondary | ICD-10-CM

## 2023-09-05 ENCOUNTER — Other Ambulatory Visit: Payer: Self-pay

## 2023-09-05 ENCOUNTER — Emergency Department (HOSPITAL_COMMUNITY)

## 2023-09-05 ENCOUNTER — Encounter (HOSPITAL_COMMUNITY): Payer: Self-pay

## 2023-09-05 ENCOUNTER — Observation Stay (HOSPITAL_COMMUNITY)
Admission: EM | Admit: 2023-09-05 | Discharge: 2023-09-06 | Disposition: A | Discharge status: Home / Self Care | Attending: Internal Medicine | Admitting: Internal Medicine

## 2023-09-05 DIAGNOSIS — K219 Gastro-esophageal reflux disease without esophagitis: Secondary | ICD-10-CM | POA: Diagnosis not present

## 2023-09-05 DIAGNOSIS — Z91014 Allergy to mammalian meats: Secondary | ICD-10-CM | POA: Insufficient documentation

## 2023-09-05 DIAGNOSIS — F1290 Cannabis use, unspecified, uncomplicated: Secondary | ICD-10-CM | POA: Diagnosis not present

## 2023-09-05 DIAGNOSIS — E66811 Obesity, class 1: Secondary | ICD-10-CM | POA: Insufficient documentation

## 2023-09-05 DIAGNOSIS — F419 Anxiety disorder, unspecified: Secondary | ICD-10-CM | POA: Diagnosis not present

## 2023-09-05 DIAGNOSIS — R1110 Vomiting, unspecified: Secondary | ICD-10-CM

## 2023-09-05 DIAGNOSIS — F32A Depression, unspecified: Secondary | ICD-10-CM | POA: Diagnosis not present

## 2023-09-05 DIAGNOSIS — Z87891 Personal history of nicotine dependence: Secondary | ICD-10-CM | POA: Insufficient documentation

## 2023-09-05 DIAGNOSIS — I498 Other specified cardiac arrhythmias: Secondary | ICD-10-CM | POA: Insufficient documentation

## 2023-09-05 DIAGNOSIS — Z91040 Latex allergy status: Secondary | ICD-10-CM | POA: Diagnosis not present

## 2023-09-05 DIAGNOSIS — I10 Essential (primary) hypertension: Secondary | ICD-10-CM | POA: Diagnosis present

## 2023-09-05 DIAGNOSIS — N179 Acute kidney failure, unspecified: Secondary | ICD-10-CM | POA: Diagnosis not present

## 2023-09-05 DIAGNOSIS — R112 Nausea with vomiting, unspecified: Principal | ICD-10-CM | POA: Diagnosis present

## 2023-09-05 DIAGNOSIS — Z79899 Other long term (current) drug therapy: Secondary | ICD-10-CM | POA: Diagnosis not present

## 2023-09-05 DIAGNOSIS — E1165 Type 2 diabetes mellitus with hyperglycemia: Secondary | ICD-10-CM | POA: Diagnosis present

## 2023-09-05 DIAGNOSIS — E860 Dehydration: Secondary | ICD-10-CM

## 2023-09-05 DIAGNOSIS — E876 Hypokalemia: Secondary | ICD-10-CM | POA: Diagnosis not present

## 2023-09-05 DIAGNOSIS — Z6832 Body mass index (BMI) 32.0-32.9, adult: Secondary | ICD-10-CM | POA: Insufficient documentation

## 2023-09-05 DIAGNOSIS — Z91018 Allergy to other foods: Secondary | ICD-10-CM | POA: Diagnosis present

## 2023-09-05 DIAGNOSIS — Z7984 Long term (current) use of oral hypoglycemic drugs: Secondary | ICD-10-CM | POA: Diagnosis not present

## 2023-09-05 DIAGNOSIS — E669 Obesity, unspecified: Secondary | ICD-10-CM | POA: Diagnosis present

## 2023-09-05 LAB — COMPREHENSIVE METABOLIC PANEL WITH GFR
ALT: 16 U/L (ref 0–44)
AST: 21 U/L (ref 15–41)
Albumin: 4.3 g/dL (ref 3.5–5.0)
Alkaline Phosphatase: 71 U/L (ref 38–126)
Anion gap: 16 — ABNORMAL HIGH (ref 5–15)
BUN: 22 mg/dL — ABNORMAL HIGH (ref 6–20)
CO2: 23 mmol/L (ref 22–32)
Calcium: 9.9 mg/dL (ref 8.9–10.3)
Chloride: 97 mmol/L — ABNORMAL LOW (ref 98–111)
Creatinine, Ser: 1.33 mg/dL — ABNORMAL HIGH (ref 0.44–1.00)
GFR, Estimated: 49 mL/min — ABNORMAL LOW (ref >60)
Glucose, Bld: 175 mg/dL — ABNORMAL HIGH (ref 70–99)
Potassium: 2.5 mmol/L — CL (ref 3.5–5.1)
Sodium: 136 mmol/L (ref 135–145)
Total Bilirubin: 1 mg/dL (ref 0.0–1.2)
Total Protein: 7.9 g/dL (ref 6.5–8.1)

## 2023-09-05 LAB — URINALYSIS, ROUTINE W REFLEX MICROSCOPIC
Bilirubin Urine: NEGATIVE
Glucose, UA: 50 mg/dL — AB
Ketones, ur: NEGATIVE mg/dL
Leukocytes,Ua: NEGATIVE
Nitrite: NEGATIVE
Protein, ur: 100 mg/dL — AB
Specific Gravity, Urine: 1.019 (ref 1.005–1.030)
Squamous Epithelial / HPF: >50 /HPF (ref 0–5)
pH: 5 (ref 5.0–8.0)

## 2023-09-05 LAB — CBC WITH DIFFERENTIAL/PLATELET
Abs Immature Granulocytes: 0.03 10*3/uL (ref 0.00–0.07)
Basophils Absolute: 0 10*3/uL (ref 0.0–0.1)
Basophils Relative: 0 %
Eosinophils Absolute: 0.1 10*3/uL (ref 0.0–0.5)
Eosinophils Relative: 1 %
HCT: 42.6 % (ref 36.0–46.0)
Hemoglobin: 14.6 g/dL (ref 12.0–15.0)
Immature Granulocytes: 0 %
Lymphocytes Relative: 22 %
Lymphs Abs: 2.1 10*3/uL (ref 0.7–4.0)
MCH: 30.5 pg (ref 26.0–34.0)
MCHC: 34.3 g/dL (ref 30.0–36.0)
MCV: 89.1 fL (ref 80.0–100.0)
Monocytes Absolute: 0.6 10*3/uL (ref 0.1–1.0)
Monocytes Relative: 6 %
Neutro Abs: 6.9 10*3/uL (ref 1.7–7.7)
Neutrophils Relative %: 71 %
Platelets: 326 10*3/uL (ref 150–400)
RBC: 4.78 MIL/uL (ref 3.87–5.11)
RDW: 13.7 % (ref 11.5–15.5)
WBC: 9.8 10*3/uL (ref 4.0–10.5)
nRBC: 0 % (ref 0.0–0.2)

## 2023-09-05 LAB — MAGNESIUM: Magnesium: 1.7 mg/dL (ref 1.7–2.4)

## 2023-09-05 LAB — GLUCOSE, CAPILLARY
Glucose-Capillary: 122 mg/dL — ABNORMAL HIGH (ref 70–99)
Glucose-Capillary: 114 mg/dL — ABNORMAL HIGH (ref 70–99)

## 2023-09-05 LAB — LIPASE, BLOOD: Lipase: 31 U/L (ref 11–51)

## 2023-09-05 MED ORDER — HYDRALAZINE HCL 20 MG/ML IJ SOLN: 10.0000 mg | INTRAMUSCULAR | Status: DC | PRN

## 2023-09-05 MED ORDER — SODIUM CHLORIDE 0.9 % IV BOLUS
1000.0000 mL | Freq: Once | INTRAVENOUS | Status: AC
  Administered 2023-09-05: 1000 mL via INTRAVENOUS

## 2023-09-05 MED ORDER — PANTOPRAZOLE SODIUM 40 MG IV SOLR
40.0000 mg | INTRAVENOUS | Status: DC
  Administered 2023-09-05: 40 mg via INTRAVENOUS
  Filled 2023-09-05: qty 10

## 2023-09-05 MED ORDER — PROMETHAZINE (PHENERGAN) 6.25MG IN NS 50ML IVPB: 6.2500 mg | Freq: Four times a day (QID) | INTRAVENOUS | Status: DC | PRN

## 2023-09-05 MED ORDER — POTASSIUM CHLORIDE 10 MEQ/100ML IV SOLN
10.0000 meq | INTRAVENOUS | Status: AC
  Administered 2023-09-05 (×3): 10 meq via INTRAVENOUS
  Filled 2023-09-05 (×3): qty 100

## 2023-09-05 MED ORDER — POTASSIUM CHLORIDE 10 MEQ/100ML IV SOLN: 10.0000 meq | INTRAVENOUS | Status: AC

## 2023-09-05 MED ORDER — INSULIN ASPART 100 UNIT/ML IJ SOLN
0.0000 [IU] | INTRAMUSCULAR | Status: DC
  Administered 2023-09-05: 1 [IU] via SUBCUTANEOUS
  Administered 2023-09-06: 2 [IU] via SUBCUTANEOUS

## 2023-09-05 MED ORDER — POTASSIUM CHLORIDE 2 MEQ/ML IV SOLN: INTRAVENOUS | Status: DC

## 2023-09-05 MED ORDER — ONDANSETRON HCL 4 MG PO TABS: 4.0000 mg | ORAL_TABLET | Freq: Four times a day (QID) | ORAL | Status: DC | PRN

## 2023-09-05 MED ORDER — LORAZEPAM 2 MG/ML IJ SOLN
1.0000 mg | Freq: Once | INTRAMUSCULAR | Status: AC
  Administered 2023-09-05: 1 mg via INTRAVENOUS
  Filled 2023-09-05: qty 1

## 2023-09-05 MED ORDER — SODIUM CHLORIDE 0.9% FLUSH
3.0000 mL | Freq: Two times a day (BID) | INTRAVENOUS | Status: DC
  Administered 2023-09-05 – 2023-09-06 (×2): 3 mL via INTRAVENOUS

## 2023-09-05 MED ORDER — ACETAMINOPHEN 650 MG RE SUPP: 650.0000 mg | Freq: Four times a day (QID) | RECTAL | Status: DC | PRN

## 2023-09-05 MED ORDER — MAGNESIUM SULFATE 2 GM/50ML IV SOLN
2.0000 g | Freq: Once | INTRAVENOUS | Status: AC
  Administered 2023-09-05: 2 g via INTRAVENOUS
  Filled 2023-09-05: qty 50

## 2023-09-05 MED ORDER — SODIUM CHLORIDE 0.9 % IV SOLN
12.5000 mg | Freq: Four times a day (QID) | INTRAVENOUS | Status: DC | PRN
  Administered 2023-09-05: 12.5 mg via INTRAVENOUS
  Filled 2023-09-05: qty 0.5

## 2023-09-05 MED ORDER — ACETAMINOPHEN 325 MG PO TABS
650.0000 mg | ORAL_TABLET | Freq: Four times a day (QID) | ORAL | Status: DC | PRN
  Administered 2023-09-06: 650 mg via ORAL
  Filled 2023-09-05: qty 2

## 2023-09-05 MED ORDER — ONDANSETRON HCL 4 MG/2ML IJ SOLN
4.0000 mg | Freq: Four times a day (QID) | INTRAMUSCULAR | Status: DC | PRN
Start: 2023-09-05 — End: 2023-09-06

## 2023-09-05 MED ORDER — POTASSIUM CHLORIDE 10 MEQ/100ML IV SOLN
10.0000 meq | INTRAVENOUS | Status: AC
  Administered 2023-09-05 (×4): 10 meq via INTRAVENOUS
  Filled 2023-09-05 (×2): qty 100

## 2023-09-05 MED ORDER — IOHEXOL 300 MG/ML  SOLN
100.0000 mL | Freq: Once | INTRAMUSCULAR | Status: AC | PRN
  Administered 2023-09-05: 100 mL via INTRAVENOUS

## 2023-09-05 MED ORDER — POTASSIUM CHLORIDE CRYS ER 20 MEQ PO TBCR
40.0000 meq | EXTENDED_RELEASE_TABLET | Freq: Once | ORAL | Status: DC
  Filled 2023-09-05: qty 2

## 2023-09-05 MED ORDER — KCL IN DEXTROSE-NACL 20-5-0.9 MEQ/L-%-% IV SOLN
INTRAVENOUS | Status: DC
  Filled 2023-09-05: qty 1000

## 2023-09-05 NOTE — Progress Notes (Signed)
   09/05/23 1707  TOC Brief Assessment  Insurance and Status Reviewed  Patient has primary care physician Yes  Home environment has been reviewed From home  Prior level of function: Independent  Prior/Current Home Services No current home services  Social Drivers of Health Review SDOH reviewed no interventions necessary  Readmission risk has been reviewed Yes  Transition of care needs no transition of care needs at this time   Transition of Care Department [facility]) has reviewed patient and no other TOC needs have been identified at this time. We will continue to monitor patient advancement through interdisciplinary progression rounds. If new patient needs arise, please place a TOC consult.

## 2023-09-05 NOTE — ED Provider Notes (Signed)
 [place] EMERGENCY DEPARTMENT AT [facility] Provider Note   CSN: [id number] Arrival date & time: 09/05/23  1028     History  Chief Complaint  Patient presents with   Emesis    Sahithi Bowermaster is a 53 y.o. female.  Patient is a 53 year old female who presents emergency department the chief complaint of nausea, vomiting, diarrhea which began this morning.  Patient notes that she does have a history of alpha gal and notes that she may have eaten something causing her symptoms.  She does admit to some abdominal cramping.  She denies any chest pain or shortness of breath.  She denies any associated dizziness, lightheadedness or syncope.   Emesis Associated symptoms: abdominal pain        Home Medications Prior to Admission medications   Medication Sig Start Date End Date Taking? Authorizing Provider  amLODipine  (NORVASC ) 10 MG tablet Take 10 mg by mouth daily. 02/22/23   [provider]  buPROPion  (WELLBUTRIN  XL) 300 MG 24 hr tablet Take 300 mg by mouth daily. 09/07/21   [provider]  dicyclomine  (BENTYL ) 20 MG tablet Take 20 mg by mouth as needed for spasms. Patient not taking: Reported on 06/17/2023 08/24/22   [provider]  EPINEPHrine  0.3 mg/0.3 mL IJ SOAJ injection Inject 0.3 mg into the muscle as needed for anaphylaxis. 12/24/22   Orelia Binet, MD  escitalopram  (LEXAPRO ) 20 MG tablet Take 20 mg by mouth daily. 02/19/23   [provider]  magnesium  oxide (MAG-OX) 400 MG tablet Take by mouth. 05/12/23   [provider]  naproxen  (NAPROSYN ) 375 MG tablet Take 1 tablet (375 mg total) by mouth 2 (two) times daily. Patient not taking: Reported on 06/17/2023 05/31/23   Kommor, Alyse July, MD  ondansetron  (ZOFRAN -ODT) 4 MG disintegrating tablet Take 1 tablet (4 mg total) by mouth every 8 (eight) hours as needed for nausea or vomiting. 12/31/22   Justina Oman, MD  pantoprazole  (PROTONIX ) 40 MG tablet Take 1 tablet by mouth daily.  11/03/22   [provider]  potassium chloride  (KLOR-CON  M) 10 MEQ tablet Take 2 tablets (20 mEq total) by mouth 2 (two) times daily for 2 doses. 05/17/23 05/18/23  Shahmehdi, Seyed A, MD  XOLAIR  150 MG/ML prefilled syringe Inject 300 mg into the skin every 28 (twenty-eight) days. 06/20/23   Orelia Binet, MD      Allergies    Latex, Metoclopramide , Alpha-gal, Heparin , Metoclopramide  hcl, and Tape    Review of Systems   Review of Systems  Gastrointestinal:  Positive for abdominal pain, nausea and vomiting.  All other systems reviewed and are negative.   Physical Exam Updated Vital Signs BP (!) 175/116 (BP Location: Right Arm)   Pulse (!) 102   Temp 97.6 F (36.4 C) (Temporal)   Resp 18   Ht 5\' 2"  (1.575 m)   Wt 81.2 kg   SpO2 100%   BMI 32.74 kg/m  Physical Exam Vitals and nursing note reviewed.  Constitutional:      Appearance: Normal appearance.  HENT:     Head: Normocephalic and atraumatic.     Nose: Nose normal.     Mouth/Throat:     Mouth: Mucous membranes are moist.  Eyes:     Extraocular Movements: Extraocular movements intact.     Conjunctiva/sclera: Conjunctivae normal.     Pupils: Pupils are equal, round, and reactive to light.  Cardiovascular:     Rate and Rhythm: Normal rate and regular rhythm.  Pulses: Normal pulses.     Heart sounds: Normal heart sounds. No murmur heard.    No gallop.  Pulmonary:     Effort: Pulmonary effort is normal. No respiratory distress.     Breath sounds: Normal breath sounds. No wheezing or rales.  Abdominal:     General: Abdomen is flat. Bowel sounds are normal. There is no distension.     Palpations: Abdomen is soft.     Tenderness: There is no abdominal tenderness. There is no guarding.  Musculoskeletal:        General: Normal range of motion.     Cervical back: Normal range of motion and neck supple.  Skin:    General: Skin is warm and dry.     Findings: No rash.  Neurological:     General: No focal deficit  present.     Mental Status: She is alert and oriented to person, place, and time. Mental status is at baseline.  Psychiatric:        Mood and Affect: Mood normal.        Behavior: Behavior normal.        Thought Content: Thought content normal.        Judgment: Judgment normal.     ED Results / Procedures / Treatments   Labs (all labs ordered are listed, but only abnormal results are displayed) Labs Reviewed  COMPREHENSIVE METABOLIC PANEL WITH GFR  LIPASE, BLOOD  CBC WITH DIFFERENTIAL/PLATELET  URINALYSIS, ROUTINE W REFLEX MICROSCOPIC    EKG None  Radiology No results found.  Procedures Procedures    Medications Ordered in ED Medications  LORazepam  (ATIVAN ) injection 1 mg (has no administration in time range)  sodium chloride  0.9 % bolus 1,000 mL (has no administration in time range)    ED Course/ Medical Decision Making/ A&P                                 Medical Decision Making Amount and/or Complexity of Data Reviewed Labs: ordered. Radiology: ordered.  Risk Prescription drug management. Decision regarding hospitalization.   This patient presents to the ED for concern of nausea, vomiting, diarrhea, abdominal pain, this involves an extensive number of treatment options, and is a complaint that carries with it a high risk of complications and morbidity.  The differential diagnosis includes acute appendicitis, cholecystitis, structures, diverticulitis, ovarian torsion or cyst, PID, pyelonephritis, kidney stone, pancreatitis, mesenteric ischemia, acute viral syndrome, electrolyte derangement   Co morbidities that complicate the patient evaluation  Alpha gal   Additional history obtained:  Additional history obtained from none External records from outside source obtained and reviewed including medical records   Lab Tests:  I Ordered, and personally interpreted labs.  The pertinent results include: No leukocytosis, no anemia, hypokalemia, elevated  glucose, elevated creatinine, unremarkable urinalysis, normal magnesium    Imaging Studies ordered:  I ordered imaging studies including CT scan of the abdomen and pelvis I independently visualized and interpreted imaging which showed no acute surgical process I agree with the radiologist interpretation   Cardiac Monitoring: / EKG:  The patient was maintained on a cardiac monitor.  I personally viewed and interpreted the cardiac monitored which showed an underlying rhythm of: Sinus tachycardia, ventricular bigeminy, nonspecific ST changes, no STEMI   Consultations Obtained:  I requested consultation with the hospitalist,  and discussed lab and imaging findings as well as pertinent plan - they recommend: Admission   Problem List /  ED Course / Critical interventions / Medication management  Patient does remain stable at this time.  She continues to have ongoing nausea and vomiting in the emergency department despite IV medications and IV hydration.  She was found to be hypokalemic and has been started on IV replacement.  CT scan of the abdomen pelvis demonstrated no acute surgical process.  EKG does have findings concerning for changes consistent with her hypokalemia.  Will plan for admission to the hospital service at this time.  Have discussed patient case with Dr. Fausto Hooker with the hospitalist service who has excepted for admission.  Patient was evaluated by attending physician who is in agreement to plan at this time. I ordered medication including Phenergan , Ativan , IV fluids, IV magnesium  for nausea and vomiting, dehydration, hypokalemia Reevaluation of the patient after these medicines showed that the patient improved I have reviewed the patients home medicines and have made adjustments as needed   Social Determinants of Health:  None   Test / Admission - Considered:  Admission        Final Clinical Impression(s) / ED Diagnoses Final diagnoses:  None    Rx / DC  Orders ED Discharge Orders     None         Emmalene Hare 09/05/23 1447    Dorenda Gandy, MD 09/06/23 1304

## 2023-09-05 NOTE — ED Notes (Signed)
 Patient transported to CT

## 2023-09-05 NOTE — ED Notes (Signed)
Urine sample requested.

## 2023-09-05 NOTE — ED Notes (Signed)
 ED TO INPATIENT HANDOFF REPORT  ED Nurse Name and Phone #: [phone]  S Name/Age/Gender Beverly Elliott Pastel 53 y.o. female Room/Bed: APA12/APA12  Code Status   Code Status: Prior  Home/SNF/Other Home Patient oriented to: x4 Is this baseline? Yes   Triage Complete: Triage complete  Chief Complaint Intractable nausea and vomiting [R11.2]  Triage Note Pt arrived via POV c/o emesis that began today. Pt reports having Alpha Gal and possibly ate something that was cross-contaminated.    Allergies Allergies  Allergen Reactions   Latex Swelling   Metoclopramide  Nausea And Vomiting    Vomited for 8 hours    Alpha-Gal     Pork, beef, dairy, gelatin   Heparin     Metoclopramide  Hcl Nausea And Vomiting    Vomited for 8 hours   Tape Rash    Adhesive tape    Level of Care/Admitting Diagnosis ED Disposition     ED Disposition  Admit   Condition  --   Comment  Hospital Area: [facility] [[id number]]  Level of Care: Telemetry [5]  Covid Evaluation: Asymptomatic - no recent exposure (last 10 days) testing not required  Diagnosis: Intractable nausea and vomiting [720114]  Admitting Physician: Wynetta Heckle [[id number]]  Attending Physician: GRUNZ, RYAN B [place].Bending          B Medical/Surgery History Past Medical History:  Diagnosis Date   Anemia    Asthma    Depression    GERD (gastroesophageal reflux disease)    History of chicken pox    IBS (irritable bowel syndrome)    Kidney stones    Migraine    Urticaria    UTI (lower urinary tract infection)    Past Surgical History:  Procedure Laterality Date   ABDOMINAL HYSTERECTOMY     ADENOIDECTOMY     CESAREAN SECTION     TONSILLECTOMY       A IV Location/Drains/Wounds Patient Lines/Drains/Airways Status     Active Line/Drains/Airways     Name Placement date Placement time Site Days   Peripheral IV 09/05/23 20 G Left Antecubital 09/05/23  1131  Antecubital  less than 1            Intake/Output Last 24  hours No intake or output data in the 24 hours ending 09/05/23 1454  Labs/Imaging Results for orders placed or performed during the hospital encounter of 09/05/23 (from the past 48 hours)  Comprehensive metabolic panel     Status: Abnormal   Collection Time: 09/05/23 11:12 AM  Result Value Ref Range   Sodium 136 135 - 145 mmol/L   Potassium 2.5 (LL) 3.5 - 5.1 mmol/L    Comment: CRITICAL RESULT CALLED TO, READ BACK BY AND VERIFIED WITH LONG,L AT 11:35AM ON 09/05/23 BY FESTERMAN,C   Chloride 97 (L) 98 - 111 mmol/L   CO2 23 22 - 32 mmol/L   Glucose, Bld 175 (H) 70 - 99 mg/dL    Comment: Glucose reference range applies only to samples taken after fasting for at least 8 hours.   BUN 22 (H) 6 - 20 mg/dL   Creatinine, Ser 7.84 (H) 0.44 - 1.00 mg/dL   Calcium  9.9 8.9 - 10.3 mg/dL   Total Protein 7.9 6.5 - 8.1 g/dL   Albumin 4.3 3.5 - 5.0 g/dL   AST 21 15 - 41 U/L   ALT 16 0 - 44 U/L   Alkaline Phosphatase 71 38 - 126 U/L   Total Bilirubin 1.0 0.0 - 1.2 mg/dL   GFR, Estimated  49 (L) >60 mL/min    Comment: (NOTE) Calculated using the CKD-EPI Creatinine Equation (2021)    Anion gap 16 (H) 5 - 15    Comment: Performed at [facility], [address], [place], [place] [address]  Lipase, blood     Status: None   Collection Time: 09/05/23 11:12 AM  Result Value Ref Range   Lipase 31 11 - 51 U/L    Comment: Performed at [facility], [address], [place], [place] [address]  CBC with Differential     Status: None   Collection Time: 09/05/23 11:12 AM  Result Value Ref Range   WBC 9.8 4.0 - 10.5 K/uL   RBC 4.78 3.87 - 5.11 MIL/uL   Hemoglobin 14.6 12.0 - 15.0 g/dL   HCT 09.8 11.9 - 14.7 %   MCV 89.1 80.0 - 100.0 fL   MCH 30.5 26.0 - 34.0 pg   MCHC 34.3 30.0 - 36.0 g/dL   RDW 82.9 56.2 - 13.0 %   Platelets 326 150 - 400 K/uL   nRBC 0.0 0.0 - 0.2 %   Neutrophils Relative % 71 %   Neutro Abs 6.9 1.7 - 7.7 K/uL   Lymphocytes Relative 22 %   Lymphs Abs 2.1 0.7 - 4.0 K/uL   Monocytes  Relative 6 %   Monocytes Absolute 0.6 0.1 - 1.0 K/uL   Eosinophils Relative 1 %   Eosinophils Absolute 0.1 0.0 - 0.5 K/uL   Basophils Relative 0 %   Basophils Absolute 0.0 0.0 - 0.1 K/uL   Immature Granulocytes 0 %   Abs Immature Granulocytes 0.03 0.00 - 0.07 K/uL    Comment: Performed at [facility], [address], [place], [place] [address]  Magnesium      Status: None   Collection Time: 09/05/23 11:12 AM  Result Value Ref Range   Magnesium  1.7 1.7 - 2.4 mg/dL    Comment: Performed at [facility], [address] [place], [place] [address]  Urinalysis, Routine w reflex microscopic -Urine, Clean Catch     Status: Abnormal   Collection Time: 09/05/23 12:40 PM  Result Value Ref Range   Color, Urine AMBER (A) YELLOW    Comment: BIOCHEMICALS MAY BE AFFECTED BY COLOR   APPearance TURBID (A) CLEAR   Specific Gravity, Urine 1.019 1.005 - 1.030   pH 5.0 5.0 - 8.0   Glucose, UA 50 (A) NEGATIVE mg/dL   Hgb urine dipstick SMALL (A) NEGATIVE   Bilirubin Urine NEGATIVE NEGATIVE   Ketones, ur NEGATIVE NEGATIVE mg/dL   Protein, ur 952 (A) NEGATIVE mg/dL   Nitrite NEGATIVE NEGATIVE   Leukocytes,Ua NEGATIVE NEGATIVE   RBC / HPF 6-10 0 - 5 RBC/hpf   WBC, UA 11-20 0 - 5 WBC/hpf   Bacteria, UA RARE (A) NONE SEEN   Squamous Epithelial / HPF >50 0 - 5 /HPF   WBC Clumps PRESENT    Mucus PRESENT    Budding Yeast PRESENT     Comment: Performed at [facility], [address], [place], [place] [address]   CT ABDOMEN PELVIS W CONTRAST Result Date: 09/05/2023 CLINICAL DATA:  Abdominal pain, acute, nonlocalized History of alpha-gal. EXAM: CT ABDOMEN AND PELVIS WITH CONTRAST TECHNIQUE: Multidetector CT imaging of the abdomen and pelvis was performed using the standard protocol following bolus administration of intravenous contrast. RADIATION DOSE REDUCTION: This exam was performed according to the departmental dose-optimization program which includes automated exposure control, adjustment of the mA and/or  kV according to patient size and/or use  of iterative reconstruction technique. CONTRAST:  OMNIPAQUE IOHEXOL 300 MG/ML  SOLN COMPARISON:  Abdominopelvic CT 12/29/2022 FINDINGS: Lower chest: Clear lung bases. No significant pleural or pericardial effusion. Possible small hiatal hernia with mild distal esophageal wall thickening, similar to previous study. Hepatobiliary: The liver is normal in density without suspicious focal abnormality. Stable focal fat adjacent to the falciform ligament. No evidence of gallstones, gallbladder wall thickening or biliary dilatation. Pancreas: Unremarkable. No pancreatic ductal dilatation or surrounding inflammatory changes. Spleen: Normal in size without focal abnormality. Adrenals/Urinary Tract: Both adrenal glands appear normal. No evidence of urinary tract calculus, suspicious renal lesion or hydronephrosis. The bladder appears unremarkable for its degree of distention. Stomach/Bowel: No enteric contrast administered. The stomach appears unremarkable for its degree of distension. No evidence of bowel wall thickening, distention or surrounding inflammatory change. The appendix appears normal. Stable mild chronic colonic intramural fat deposition and sigmoid colon wall thickening without surrounding inflammation. Vascular/Lymphatic: There are no enlarged abdominal or pelvic lymph nodes. Stable mild aortoiliac atherosclerosis. Reproductive: Status post hysterectomy.  No adnexal mass. Other: No evidence of abdominal wall mass or hernia. No ascites or pneumoperitoneum. Musculoskeletal: No acute or significant osseous findings. Chronic lower lumbar spondylosis with possible new extruded disc fragment extending inferiorly on the left at L5-S1. IMPRESSION: 1. No acute findings or explanation for the patient's symptoms. 2. Stable mild chronic intramural fat deposition within the colon without surrounding inflammation, likely related to chronic inflammation. 3. Possible new extruded  disc fragment extending inferiorly on the left at L5-S1. 4.  Aortic Atherosclerosis (ICD10-I70.0). Electronically Signed   By: Elmon Hagedorn M.D.   On: 09/05/2023 14:20    Pending Labs Unresulted Labs (From admission, onward)    None       Vitals/Pain Today's Vitals   09/05/23 1245 09/05/23 1300 09/05/23 1309 09/05/23 1315  BP: (!) 147/96 (!) 167/103  (!) 167/107  Pulse: 92 86  90  Resp: (!) 21 (!) 23  (!) 24  Temp:      TempSrc:      SpO2: 94% 93%  98%  Weight:      Height:      PainSc:   Asleep     Isolation Precautions No active isolations  Medications Medications  potassium chloride  10 mEq in 100 mL IVPB (10 mEq Intravenous New Bag/Given 09/05/23 1344)  potassium chloride  SA (KLOR-CON  M) CR tablet 40 mEq (40 mEq Oral Patient Refused/Not Given 09/05/23 1212)  promethazine  (PHENERGAN ) 12.5 mg in sodium chloride  0.9 % 50 mL IVPB (has no administration in time range)  insulin  aspart (novoLOG ) injection 0-9 Units (has no administration in time range)  dextrose  5% lactated ringers  1,000 mL with potassium chloride  40 mEq infusion (has no administration in time range)  LORazepam  (ATIVAN ) injection 1 mg (1 mg Intravenous Given 09/05/23 1131)  sodium chloride  0.9 % bolus 1,000 mL (1,000 mLs Intravenous Bolus 09/05/23 1131)  iohexol (OMNIPAQUE) 300 MG/ML solution 100 mL (100 mLs Intravenous Contrast Given 09/05/23 1322)    Mobility walks     Focused Assessments      R Recommendations: See Admitting Provider Note  Report given to:   Additional Notes:

## 2023-09-05 NOTE — H&P (Signed)
 History and Physical    Patient: Beverly Elliott WJX:[id number] DOB: 1970/12/23 DOA: 09/05/2023 DOS: the patient was seen and examined on 09/05/2023 PCP: Joenathan Muslim, FNP  Patient coming from: Home  Chief Complaint:  Chief Complaint  Patient presents with   Emesis   HPI: Beverly Elliott is a 53 y.o. female with alpha-gal, GERD, IBS, migraine among others who presented to the ED this morning with severe nausea and vomiting she suspects is related to unknown, unintentional ingestion. She was severely nauseated, treated with multiple IV antiemetics and IV fluids and potassium supplementation, admission requested as she failed po challenge. She remains intensely nauseated, unable to take even sips for pills. CT abd/pelvis showed no significant acute process. She denies abdominal pain, sick contacts, travel. Also had some diarrhea. Note that patient is exceedingly nauseated and tired from phenergan , ativan , etc. so history is somewhat limited.   Review of Systems: As mentioned in the history of present illness. All other systems reviewed and are negative. Does not mention back pain or dysuria or fever.  Past Medical History:  Diagnosis Date   Anemia    Asthma    Depression    GERD (gastroesophageal reflux disease)    History of chicken pox    IBS (irritable bowel syndrome)    Kidney stones    Migraine    Urticaria    UTI (lower urinary tract infection)    Past Surgical History:  Procedure Laterality Date   ABDOMINAL HYSTERECTOMY     ADENOIDECTOMY     CESAREAN SECTION     TONSILLECTOMY     Social History:  reports that she has quit smoking. Her smoking use included cigarettes. She has never used smokeless tobacco. She reports that she does not currently use alcohol. She reports current drug use. Drug: Marijuana.  Allergies  Allergen Reactions   Latex Swelling   Metoclopramide  Nausea And Vomiting    Vomited for 8 hours    Alpha-Gal     Pork, beef, dairy, gelatin    Heparin     Metoclopramide  Hcl Nausea And Vomiting    Vomited for 8 hours   Tape Rash    Adhesive tape    Family History  Problem Relation Age of Onset   Coronary artery disease Mother    Hypertension Mother    Allergic rhinitis Father    Coronary artery disease Father    Heart attack Father    Allergic rhinitis Sister    Diabetes Maternal Grandfather    Hypertension Paternal Grandmother    Asthma Neg Hx    Eczema Neg Hx    Urticaria Neg Hx     Prior to Admission medications   Medication Sig Start Date End Date Taking? Authorizing Provider  amLODipine  (NORVASC ) 10 MG tablet Take 10 mg by mouth daily. 02/22/23   [provider]  buPROPion  (WELLBUTRIN  XL) 300 MG 24 hr tablet Take 300 mg by mouth daily. 09/07/21   [provider]  dicyclomine  (BENTYL ) 20 MG tablet Take 20 mg by mouth as needed for spasms. Patient not taking: Reported on 06/17/2023 08/24/22   [provider]  EPINEPHrine  0.3 mg/0.3 mL IJ SOAJ injection Inject 0.3 mg into the muscle as needed for anaphylaxis. 12/24/22   Orelia Binet, MD  escitalopram  (LEXAPRO ) 20 MG tablet Take 20 mg by mouth daily. 02/19/23   [provider]  magnesium  oxide (MAG-OX) 400 MG tablet Take by mouth. 05/12/23   [provider]  naproxen  (NAPROSYN ) 375 MG tablet Take 1  tablet (375 mg total) by mouth 2 (two) times daily. Patient not taking: Reported on 06/17/2023 05/31/23   Kommor, Alyse July, MD  ondansetron  (ZOFRAN -ODT) 4 MG disintegrating tablet Take 1 tablet (4 mg total) by mouth every 8 (eight) hours as needed for nausea or vomiting. 12/31/22   Justina Oman, MD  pantoprazole  (PROTONIX ) 40 MG tablet Take 1 tablet by mouth daily. 11/03/22   [provider]  potassium chloride  (KLOR-CON  M) 10 MEQ tablet Take 2 tablets (20 mEq total) by mouth 2 (two) times daily for 2 doses. 05/17/23 05/18/23  Shahmehdi, Seyed A, MD  XOLAIR  150 MG/ML prefilled syringe Inject 300 mg into the skin every 28 (twenty-eight)  days. 06/20/23   Orelia Binet, MD    Physical Exam: Vitals:   09/05/23 1300 09/05/23 1315 09/05/23 1501 09/05/23 1625  BP: (!) 167/103 (!) 167/107  (!) 180/113  Pulse: 86 90  92  Resp: (!) 23 (!) 24  18  Temp:   97.7 F (36.5 C) 98.5 F (36.9 C)  TempSrc:   Oral Oral  SpO2: 93% 98%  99%  Weight:      Height:      Gen: Uncomfortable female laying face down in bed Pulm: Clear, nonlabored  CV: RRR, no MRG or edema GI: Soft, NT, ND, +BS Neuro: Alert and oriented. No new focal deficits. Ext: Warm, no deformities. Skin: No rashes, lesions or ulcers on visualized skin   Data Reviewed: K 2.5, mag 1.7 SCr 1.3 (may be baseline), BUN 22, glucose 175. Bicarb 23, AG 16. CBC w/diff is normal LFTs all normal ECG with ventricular bigeminy, native rate has narrow QRS, normal overall rate, QTc UA: Glucose, negative ketones, >50 squamous cells, rare bacteria.  CT abd/pelvis: 1. No acute findings or explanation for the patient's symptoms. 2. Stable mild chronic intramural fat deposition within the colon without surrounding inflammation, likely related to chronic inflammation. 3. Possible new extruded disc fragment extending inferiorly on the left at L5-S1. 4.  Aortic Atherosclerosis (ICD10-I70.0).  Assessment and Plan: Intractable nausea and vomiting, severe hypokalemia, GERD:  - Pt is IV (fluid/hydration and medication)-dependent.  - Has ECG changes (ventricular bigeminy) due to low K, will supplement aggressively, give magnesium  as well, and keep on cardiac monitoring.  - Start D5LR + 40mEq KCl ideally, this is unavailable. Will settle for D5NS w/71mEq KCl and also give  - IV antiemetics as ordered (note reported reglan  allergy . Also had negative gastric emptying study 2024 noted. Abdominal exam and CT abd/pelvis are reassuring  - Consider migraine Tx if symptoms persist (not highly suspected at this time due to concomitant diarrhea reported) - PPI IV  HTN:  - prn  hydralazine  for now - Restart home norvasc  once tolerating po.  Depression, anxiety:  - Restart home bupropion , escitalopram  once tolerating po  Fasting ketoacidemia noted: Bicarb 23, AG 16. Will monitor by labs in AM, add D5 into IVF and cover with insulin .   T2DM: HbA1c 5.8% - SSI q4h  Alpha-gal allergy :  - Avoid provocative agents (including heparin  products; so needs SCDs as she's unable to take po)   Advance Care Planning: Full code  Consults: None  Family Communication: At bedside  Severity of Illness: The appropriate patient status for this patient is OBSERVATION. Observation status is judged to be reasonable and necessary in order to provide the required intensity of service to ensure the patient's safety. The patient's presenting symptoms, physical exam findings, and initial radiographic and laboratory data in the context of their  medical condition is felt to place them at decreased risk for further clinical deterioration. Furthermore, it is anticipated that the patient will be medically stable for discharge from the hospital within 2 midnights of admission.   Author: Wynetta Heckle, MD 09/05/2023 5:38 PM  For on call review [email or web address].

## 2023-09-05 NOTE — ED Triage Notes (Signed)
 Pt arrived via POV c/o emesis that began today. Pt reports having Alpha Gal and possibly ate something that was cross-contaminated.

## 2023-09-06 DIAGNOSIS — R112 Nausea with vomiting, unspecified: Secondary | ICD-10-CM | POA: Diagnosis not present

## 2023-09-06 LAB — GLUCOSE, CAPILLARY
Glucose-Capillary: 102 mg/dL — ABNORMAL HIGH (ref 70–99)
Glucose-Capillary: 106 mg/dL — ABNORMAL HIGH (ref 70–99)
Glucose-Capillary: 156 mg/dL — ABNORMAL HIGH (ref 70–99)
Glucose-Capillary: 95 mg/dL (ref 70–99)

## 2023-09-06 LAB — CBC
HCT: 36.5 % (ref 36.0–46.0)
Hemoglobin: 11.8 g/dL — ABNORMAL LOW (ref 12.0–15.0)
MCH: 29.4 pg (ref 26.0–34.0)
MCHC: 32.3 g/dL (ref 30.0–36.0)
MCV: 91 fL (ref 80.0–100.0)
Platelets: 249 10*3/uL (ref 150–400)
RBC: 4.01 MIL/uL (ref 3.87–5.11)
RDW: 13.4 % (ref 11.5–15.5)
WBC: 6.5 10*3/uL (ref 4.0–10.5)
nRBC: 0 % (ref 0.0–0.2)

## 2023-09-06 LAB — COMPREHENSIVE METABOLIC PANEL WITH GFR
ALT: 13 U/L (ref 0–44)
AST: 17 U/L (ref 15–41)
Albumin: 3.1 g/dL — ABNORMAL LOW (ref 3.5–5.0)
Alkaline Phosphatase: 51 U/L (ref 38–126)
Anion gap: 8 (ref 5–15)
BUN: 12 mg/dL (ref 6–20)
CO2: 26 mmol/L (ref 22–32)
Calcium: 8.5 mg/dL — ABNORMAL LOW (ref 8.9–10.3)
Chloride: 106 mmol/L (ref 98–111)
Creatinine, Ser: 0.93 mg/dL (ref 0.44–1.00)
GFR, Estimated: >60 mL/min (ref >60)
Glucose, Bld: 147 mg/dL — ABNORMAL HIGH (ref 70–99)
Potassium: 2.5 mmol/L — CL (ref 3.5–5.1)
Sodium: 140 mmol/L (ref 135–145)
Total Bilirubin: 0.4 mg/dL (ref 0.0–1.2)
Total Protein: 5.9 g/dL — ABNORMAL LOW (ref 6.5–8.1)

## 2023-09-06 LAB — MAGNESIUM
Magnesium: 2.5 mg/dL — ABNORMAL HIGH (ref 1.7–2.4)
Magnesium: 2.3 mg/dL (ref 1.7–2.4)

## 2023-09-06 LAB — POTASSIUM: Potassium: 3.6 mmol/L (ref 3.5–5.1)

## 2023-09-06 MED ORDER — POTASSIUM CHLORIDE 10 MEQ/100ML IV SOLN: 10.0000 meq | INTRAVENOUS | Status: DC

## 2023-09-06 MED ORDER — POTASSIUM CHLORIDE CRYS ER 20 MEQ PO TBCR
40.0000 meq | EXTENDED_RELEASE_TABLET | ORAL | Status: AC
Start: 2023-09-06 — End: 2023-09-06
  Administered 2023-09-06 (×3): 40 meq via ORAL
  Filled 2023-09-06 (×3): qty 2

## 2023-09-06 NOTE — Plan of Care (Signed)

## 2023-09-06 NOTE — Discharge Instructions (Signed)
Follow with Trisha Mangle, FNP in 5-7 days  Please get a complete blood count and chemistry panel checked by your Primary MD at your next visit, and again as instructed by your Primary MD. Please get your medications reviewed and adjusted by your Primary MD.  Please request your Primary MD to go over all Hospital Tests and Procedure/Radiological results at the follow up, please get all Hospital records sent to your Prim MD by signing hospital release before you go home.  In some cases, there will be blood work, cultures and biopsy results pending at the time of your discharge. Please request that your primary care M.D. goes through all the records of your hospital data and follows up on these results.  If you had Pneumonia of Lung problems at the Hospital: Please get a 2 view Chest X ray done in 6-8 weeks after hospital discharge or sooner if instructed by your Primary MD.  If you have Congestive Heart Failure: Please call your Cardiologist or Primary MD anytime you have any of the following symptoms:  1) 3 pound weight gain in 24 hours or 5 pounds in 1 week  2) shortness of breath, with or without a dry hacking cough  3) swelling in the hands, feet or stomach  4) if you have to sleep on extra pillows at night in order to breathe  Follow cardiac low salt diet and 1.5 lit/day fluid restriction.  If you have diabetes Accuchecks 4 times/day, Once in AM empty stomach and then before each meal. Log in all results and show them to your primary doctor at your next visit. If any glucose reading is under 80 or above 300 call your primary MD immediately.  If you have Seizure/Convulsions/Epilepsy: Please do not drive, operate heavy machinery, participate in activities at heights or participate in high speed sports until you have seen by Primary MD or a Neurologist and advised to do so again. Per [facility] statutes, patients with seizures are not allowed to drive until they have been  seizure-free for six months.  Use caution when using heavy equipment or power tools. Avoid working on ladders or at heights. Take showers instead of baths. Ensure the water temperature is not too high on the home water heater. Do not go swimming alone. Do not lock yourself in a room alone (i.e. bathroom). When caring for infants or small children, sit down when holding, feeding, or changing them to minimize risk of injury to the child in the event you have a seizure. Maintain good sleep hygiene. Avoid alcohol.   If you had Gastrointestinal Bleeding: Please ask your Primary MD to check a complete blood count within one week of discharge or at your next visit. Your endoscopic/colonoscopic biopsies that are pending at the time of discharge, will also need to followed by your Primary MD.  Get Medicines reviewed and adjusted. Please take all your medications with you for your next visit with your Primary MD  Please request your Primary MD to go over all hospital tests and procedure/radiological results at the follow up, please ask your Primary MD to get all Hospital records sent to his/her office.  If you experience worsening of your admission symptoms, develop shortness of breath, life threatening emergency, suicidal or homicidal thoughts you must seek medical attention immediately by calling 911 or calling your MD immediately  if symptoms less severe.  You must read complete instructions/literature along with all the possible adverse reactions/side effects for all the Medicines you  take and that have been prescribed to you. Take any new Medicines after you have completely understood and accpet all the possible adverse reactions/side effects.   Do not drive or operate heavy machinery when taking Pain medications.   Do not take more than prescribed Pain, Sleep and Anxiety Medications  Special Instructions: If you have smoked or chewed Tobacco  in the last 2 yrs please stop smoking, stop any regular  Alcohol  and or any Recreational drug use.  Wear Seat belts while driving.  Please note You were cared for by a hospitalist during your hospital stay. If you have any questions about your discharge medications or the care you received while you were in the hospital after you are discharged, you can call the unit and asked to speak with the hospitalist on call if the hospitalist that took care of you is not available. Once you are discharged, your primary care physician will handle any further medical issues. Please note that NO REFILLS for any discharge medications will be authorized once you are discharged, as it is imperative that you return to your primary care physician (or establish a relationship with a primary care physician if you do not have one) for your aftercare needs so that they can reassess your need for medications and monitor your lab values.  You can reach the hospitalist office at phone [phone] or fax [phone]   If you do not have a primary care physician, you can call [phone] for a physician referral.  Activity: As tolerated with Full fall precautions use walker/cane & assistance as needed    Diet: regular  Disposition Home

## 2023-09-06 NOTE — Plan of Care (Signed)

## 2023-09-06 NOTE — Discharge Summary (Signed)
 Physician Discharge Summary  Beverly Elliott NWG:[id number] DOB: 10-24-1970 DOA: 09/05/2023  PCP: Joenathan Muslim, FNP  Admit date: 09/05/2023 Discharge date: 09/06/2023  Admitted From: home Disposition:  home  Recommendations for Outpatient Follow-up:  Follow up with PCP in 1-2 weeks Please obtain BMP/CBC in one week  Home Health: none Equipment/Devices: none  Discharge Condition: stable CODE STATUS: Full code Diet Orders (From admission, onward)     Start     Ordered   09/06/23 0709  Diet regular Room service appropriate? Yes; Fluid consistency: Thin  Diet effective now       Question Answer Comment  Room service appropriate? Yes   Fluid consistency: Thin      09/06/23 0708            HPI: Per admitting MD, Beverly Elliott is a 53 y.o. female with alpha-gal, GERD, IBS, migraine among others who presented to the ED this morning with severe nausea and vomiting she suspects is related to unknown, unintentional ingestion. She was severely nauseated, treated with multiple IV antiemetics and IV fluids and potassium supplementation, admission requested as she failed po challenge. She remains intensely nauseated, unable to take even sips for pills. CT abd/pelvis showed no significant acute process. She denies abdominal pain, sick contacts, travel. Also had some diarrhea. Note that patient is exceedingly nauseated and tired from phenergan , ativan , etc. so history is somewhat limited.   Hospital Course / Discharge diagnoses: Principal Problem:   Intractable nausea and vomiting Active Problems:   AKI (acute kidney injury) (HCC)   Allergy  to alpha-gal   Essential hypertension   Hypokalemia   GERD   Type 2 diabetes mellitus with hyperglycemia (HCC)   Obesity (BMI 30-39.9)  Principal problem Intractable nausea, vomiting, diarrhea -patient has a history of alpha gal with similar symptoms in the past when she came in contact with allergens.  She tells me that she is  tolerating [place], however she ate a restaurant made [place] sandwich which may have been contaminated with some of her allergens.  Following that she started experiencing significant symptoms with nausea, vomiting, diarrhea.  These are all now resolved, she is back to baseline and tolerating a regular diet.  She will be discharged home in stable condition  Active problems Profound hypokalemia with ventricular bigeminy on telemetry -patient was admitted to the hospital with profound hypokalemia in the setting of GI losses with nausea, vomiting, diarrhea.  Potassium was supplemented aggressively both IV and p.o., and repeat value is 3.6.  Magnesium  has also been supplemented and repeat values improved. With her tolerating a regular diet without further GI losses, anticipate electrolyte stability. She was advised to follow up with PCP within a week for repeat blood work. She has K supplementation at home, and was advised to take for 3 additional days.  Essential hypertension -resume home medications on discharge  Depression, anxiety-resume home medications on discharge DM2 -resume home medications on discharge Alpha gal allergy  -noted, causing #1  Obesity, class I-BMI 32.7.  Sepsis ruled out   Discharge Instructions   Allergies as of 09/06/2023       Reactions   Latex Swelling   Metoclopramide  Nausea And Vomiting   Vomited for 8 hours   Alpha-gal Other (See Comments)   Pork, beef, dairy, gelatin   Heparin  Other (See Comments)   Alpha gal   Beef-derived Drug Products    Gelatin    Pork-derived Products    Tape Rash   Adhesive tape  Medication List     TAKE these medications    amLODipine  10 MG tablet Commonly known as: NORVASC  Take 10 mg by mouth daily.   buPROPion  300 MG 24 hr tablet Commonly known as: WELLBUTRIN  XL Take 300 mg by mouth daily.   EPINEPHrine  0.3 mg/0.3 mL Soaj injection Commonly known as: EPI-PEN Inject 0.3 mg into the muscle as needed for  anaphylaxis.   escitalopram  20 MG tablet Commonly known as: LEXAPRO  Take 20 mg by mouth daily.   naproxen  500 MG tablet Commonly known as: NAPROSYN  Take 500 mg by mouth 2 (two) times daily as needed for moderate pain (pain score 4-6).   ondansetron  4 MG disintegrating tablet Commonly known as: ZOFRAN -ODT Take 1 tablet (4 mg total) by mouth every 8 (eight) hours as needed for nausea or vomiting.   pantoprazole  40 MG tablet Commonly known as: PROTONIX  Take 1 tablet by mouth daily.   tobramycin-dexamethasone ophthalmic solution Commonly known as: TOBRADEX Place 1 drop into the right eye every 6 (six) hours as needed (dry/itchy eyes).   Xolair  150 MG/ML prefilled syringe Generic drug: omalizumab  Inject 300 mg into the skin every 28 (twenty-eight) days.        Follow-up Information     Joenathan Muslim, FNP Follow up in 1 week(s).   Specialty: Family Medicine Contact information: [address] [place] [address] [phone]               Consultations:   Procedures/Studies: none  CT ABDOMEN PELVIS W CONTRAST Result Date: 09/05/2023 CLINICAL DATA:  Abdominal pain, acute, nonlocalized History of alpha-gal. EXAM: CT ABDOMEN AND PELVIS WITH CONTRAST TECHNIQUE: Multidetector CT imaging of the abdomen and pelvis was performed using the standard protocol following bolus administration of intravenous contrast. RADIATION DOSE REDUCTION: This exam was performed according to the departmental dose-optimization program which includes automated exposure control, adjustment of the mA and/or kV according to patient size and/or use of iterative reconstruction technique. CONTRAST:  OMNIPAQUE IOHEXOL 300 MG/ML  SOLN COMPARISON:  Abdominopelvic CT 12/29/2022 FINDINGS: Lower chest: Clear lung bases. No significant pleural or pericardial effusion. Possible small hiatal hernia with mild distal esophageal wall thickening, similar to previous study. Hepatobiliary: The liver is  normal in density without suspicious focal abnormality. Stable focal fat adjacent to the falciform ligament. No evidence of gallstones, gallbladder wall thickening or biliary dilatation. Pancreas: Unremarkable. No pancreatic ductal dilatation or surrounding inflammatory changes. Spleen: Normal in size without focal abnormality. Adrenals/Urinary Tract: Both adrenal glands appear normal. No evidence of urinary tract calculus, suspicious renal lesion or hydronephrosis. The bladder appears unremarkable for its degree of distention. Stomach/Bowel: No enteric contrast administered. The stomach appears unremarkable for its degree of distension. No evidence of bowel wall thickening, distention or surrounding inflammatory change. The appendix appears normal. Stable mild chronic colonic intramural fat deposition and sigmoid colon wall thickening without surrounding inflammation. Vascular/Lymphatic: There are no enlarged abdominal or pelvic lymph nodes. Stable mild aortoiliac atherosclerosis. Reproductive: Status post hysterectomy.  No adnexal mass. Other: No evidence of abdominal wall mass or hernia. No ascites or pneumoperitoneum. Musculoskeletal: No acute or significant osseous findings. Chronic lower lumbar spondylosis with possible new extruded disc fragment extending inferiorly on the left at L5-S1. IMPRESSION: 1. No acute findings or explanation for the patient's symptoms. 2. Stable mild chronic intramural fat deposition within the colon without surrounding inflammation, likely related to chronic inflammation. 3. Possible new extruded disc fragment extending inferiorly on the left at L5-S1. 4.  Aortic Atherosclerosis (  ICD10-I70.0). Electronically Signed   By: Elmon Hagedorn M.D.   On: 09/05/2023 14:20     Subjective: - no chest pain, shortness of breath, no abdominal pain, nausea or vomiting.   Discharge Exam: BP (!) 130/93   Pulse 64   Temp 98.5 F (36.9 C) (Oral)   Resp 20   Ht 5\' 2"  (1.575 m)   Wt 81.2  kg   SpO2 97%   BMI 32.74 kg/m   General: Pt is alert, awake, not in acute distress Cardiovascular: RRR, S1/S2 +, no rubs, no gallops Respiratory: CTA bilaterally, no wheezing, no rhonchi Abdominal: Soft, NT, ND, bowel sounds + Extremities: no edema, no cyanosis    The results of significant diagnostics from this hospitalization (including imaging, microbiology, ancillary and laboratory) are listed below for reference.     Microbiology: No results found for this or any previous visit (from the past 240 hours).   Labs: Basic Metabolic Panel: Recent Labs  Lab 09/05/23 1112 09/06/23 0347 09/06/23 0500 09/06/23 1355  NA 136  --  140  --   K 2.5*  --  2.5* 3.6  CL 97*  --  106  --   CO2 23  --  26  --   GLUCOSE 175*  --  147*  --   BUN 22*  --  12  --   CREATININE 1.33*  --  0.93  --   CALCIUM  9.9  --  8.5*  --   MG 1.7 2.5*  --  2.3   Liver Function Tests: Recent Labs  Lab 09/05/23 1112 09/06/23 0500  AST 21 17  ALT 16 13  ALKPHOS 71 51  BILITOT 1.0 0.4  PROT 7.9 5.9*  ALBUMIN 4.3 3.1*   CBC: Recent Labs  Lab 09/05/23 1112 09/06/23 0500  WBC 9.8 6.5  NEUTROABS 6.9  --   HGB 14.6 11.8*  HCT 42.6 36.5  MCV 89.1 91.0  PLT 326 249   CBG: Recent Labs  Lab 09/05/23 2021 09/06/23 0020 09/06/23 0427 09/06/23 0716 09/06/23 1100  GLUCAP 114* 102* 106* 95 156*   Hgb A1c No results for input(s): "HGBA1C" in the last 72 hours. Lipid Profile No results for input(s): "CHOL", "HDL", "LDLCALC", "TRIG", "CHOLHDL", "LDLDIRECT" in the last 72 hours. Thyroid function studies No results for input(s): "TSH", "T4TOTAL", "T3FREE", "THYROIDAB" in the last 72 hours.  Invalid input(s): "FREET3" Urinalysis    Component Value Date/Time   COLORURINE AMBER (A) 09/05/2023 1240   APPEARANCEUR TURBID (A) 09/05/2023 1240   LABSPEC 1.019 09/05/2023 1240   PHURINE 5.0 09/05/2023 1240   GLUCOSEU 50 (A) 09/05/2023 1240   HGBUR SMALL (A) 09/05/2023 1240   BILIRUBINUR  NEGATIVE 09/05/2023 1240   KETONESUR NEGATIVE 09/05/2023 1240   PROTEINUR 100 (A) 09/05/2023 1240   UROBILINOGEN 0.2 09/03/2013 1353   NITRITE NEGATIVE 09/05/2023 1240   LEUKOCYTESUR NEGATIVE 09/05/2023 1240    FURTHER DISCHARGE INSTRUCTIONS:   Get Medicines reviewed and adjusted: Please take all your medications with you for your next visit with your Primary MD   Laboratory/radiological data: Please request your Primary MD to go over all hospital tests and procedure/radiological results at the follow up, please ask your Primary MD to get all Hospital records sent to his/her office.   In some cases, they will be blood work, cultures and biopsy results pending at the time of your discharge. Please request that your primary care M.D. goes through all the records of your hospital data and follows up on  these results.   Also Note the following: If you experience worsening of your admission symptoms, develop shortness of breath, life threatening emergency, suicidal or homicidal thoughts you must seek medical attention immediately by calling 911 or calling your MD immediately  if symptoms less severe.   You must read complete instructions/literature along with all the possible adverse reactions/side effects for all the Medicines you take and that have been prescribed to you. Take any new Medicines after you have completely understood and accpet all the possible adverse reactions/side effects.    Do not drive when taking Pain medications or sleeping medications (Benzodaizepines)   Do not take more than prescribed Pain, Sleep and Anxiety Medications. It is not advisable to combine anxiety,sleep and pain medications without talking with your primary care practitioner   Special Instructions: If you have smoked or chewed Tobacco  in the last 2 yrs please stop smoking, stop any regular Alcohol  and or any Recreational drug use.   Wear Seat belts while driving.   Please note: You were cared for by  a hospitalist during your hospital stay. Once you are discharged, your primary care physician will handle any further medical issues. Please note that NO REFILLS for any discharge medications will be authorized once you are discharged, as it is imperative that you return to your primary care physician (or establish a relationship with a primary care physician if you do not have one) for your post hospital discharge needs so that they can reassess your need for medications and monitor your lab values.  Time coordinating discharge: 35 minutes  SIGNED:  Kathlen Para, MD, PhD 09/06/2023, 2:28 PM

## 2023-09-12 ENCOUNTER — Ambulatory Visit

## 2023-09-20 ENCOUNTER — Ambulatory Visit

## 2023-09-20 DIAGNOSIS — Z91018 Allergy to other foods: Secondary | ICD-10-CM

## 2023-09-20 MED ORDER — OMALIZUMAB 150 MG/ML ~~LOC~~ SOSY
300.0000 mg | PREFILLED_SYRINGE | SUBCUTANEOUS | Status: AC
Start: 2023-09-20 — End: ?
  Administered 2023-09-20 – 2024-04-26 (×9): 300 mg via SUBCUTANEOUS

## 2023-10-18 ENCOUNTER — Ambulatory Visit

## 2023-10-18 DIAGNOSIS — Z91018 Allergy to other foods: Secondary | ICD-10-CM

## 2023-11-15 ENCOUNTER — Ambulatory Visit

## 2023-11-15 DIAGNOSIS — Z91018 Allergy to other foods: Secondary | ICD-10-CM

## 2023-12-08 ENCOUNTER — Emergency Department (HOSPITAL_COMMUNITY)

## 2023-12-08 ENCOUNTER — Inpatient Hospital Stay (HOSPITAL_COMMUNITY)
Admission: EM | Admit: 2023-12-08 | Discharge: 2023-12-10 | DRG: 392 | Disposition: A | Discharge status: Home / Self Care | Attending: Family Medicine | Admitting: Family Medicine

## 2023-12-08 DIAGNOSIS — Z8249 Family history of ischemic heart disease and other diseases of the circulatory system: Secondary | ICD-10-CM

## 2023-12-08 DIAGNOSIS — K529 Noninfective gastroenteritis and colitis, unspecified: Principal | ICD-10-CM | POA: Diagnosis present

## 2023-12-08 DIAGNOSIS — Z90710 Acquired absence of both cervix and uterus: Secondary | ICD-10-CM | POA: Diagnosis not present

## 2023-12-08 DIAGNOSIS — Z87891 Personal history of nicotine dependence: Secondary | ICD-10-CM

## 2023-12-08 DIAGNOSIS — F411 Generalized anxiety disorder: Secondary | ICD-10-CM | POA: Diagnosis present

## 2023-12-08 DIAGNOSIS — Z91011 Allergy to milk products: Secondary | ICD-10-CM | POA: Diagnosis not present

## 2023-12-08 DIAGNOSIS — Z91040 Latex allergy status: Secondary | ICD-10-CM | POA: Diagnosis not present

## 2023-12-08 DIAGNOSIS — Z833 Family history of diabetes mellitus: Secondary | ICD-10-CM | POA: Diagnosis not present

## 2023-12-08 DIAGNOSIS — Z91018 Allergy to other foods: Secondary | ICD-10-CM

## 2023-12-08 DIAGNOSIS — R1084 Generalized abdominal pain: Secondary | ICD-10-CM | POA: Diagnosis present

## 2023-12-08 DIAGNOSIS — E876 Hypokalemia: Secondary | ICD-10-CM | POA: Diagnosis present

## 2023-12-08 DIAGNOSIS — N179 Acute kidney failure, unspecified: Secondary | ICD-10-CM | POA: Diagnosis present

## 2023-12-08 DIAGNOSIS — K219 Gastro-esophageal reflux disease without esophagitis: Secondary | ICD-10-CM | POA: Diagnosis present

## 2023-12-08 DIAGNOSIS — Z87442 Personal history of urinary calculi: Secondary | ICD-10-CM

## 2023-12-08 DIAGNOSIS — Z91014 Allergy to mammalian meats: Secondary | ICD-10-CM

## 2023-12-08 DIAGNOSIS — Z9109 Other allergy status, other than to drugs and biological substances: Secondary | ICD-10-CM | POA: Diagnosis not present

## 2023-12-08 DIAGNOSIS — I10 Essential (primary) hypertension: Secondary | ICD-10-CM | POA: Diagnosis present

## 2023-12-08 DIAGNOSIS — F32A Depression, unspecified: Secondary | ICD-10-CM | POA: Diagnosis present

## 2023-12-08 DIAGNOSIS — Z79899 Other long term (current) drug therapy: Secondary | ICD-10-CM | POA: Diagnosis not present

## 2023-12-08 DIAGNOSIS — E1165 Type 2 diabetes mellitus with hyperglycemia: Secondary | ICD-10-CM | POA: Diagnosis present

## 2023-12-08 DIAGNOSIS — J45909 Unspecified asthma, uncomplicated: Secondary | ICD-10-CM | POA: Diagnosis present

## 2023-12-08 DIAGNOSIS — Z87440 Personal history of urinary (tract) infections: Secondary | ICD-10-CM

## 2023-12-08 DIAGNOSIS — Z888 Allergy status to other drugs, medicaments and biological substances status: Secondary | ICD-10-CM | POA: Diagnosis not present

## 2023-12-08 LAB — COMPREHENSIVE METABOLIC PANEL WITH GFR
ALT: 17 U/L (ref 0–44)
AST: 23 U/L (ref 15–41)
Albumin: 5 g/dL (ref 3.5–5.0)
Alkaline Phosphatase: 70 U/L (ref 38–126)
Anion gap: 16 — ABNORMAL HIGH (ref 5–15)
BUN: 33 mg/dL — ABNORMAL HIGH (ref 6–20)
CO2: 21 mmol/L — ABNORMAL LOW (ref 22–32)
Calcium: 10.2 mg/dL (ref 8.9–10.3)
Chloride: 101 mmol/L (ref 98–111)
Creatinine, Ser: 1.24 mg/dL — ABNORMAL HIGH (ref 0.44–1.00)
GFR, Estimated: 53 mL/min — ABNORMAL LOW (ref >60)
Glucose, Bld: 184 mg/dL — ABNORMAL HIGH (ref 70–99)
Potassium: 2.9 mmol/L — ABNORMAL LOW (ref 3.5–5.1)
Sodium: 138 mmol/L (ref 135–145)
Total Bilirubin: 1 mg/dL (ref 0.0–1.2)
Total Protein: 8.8 g/dL — ABNORMAL HIGH (ref 6.5–8.1)

## 2023-12-08 LAB — CBC WITH DIFFERENTIAL/PLATELET
Abs Immature Granulocytes: 0.07 K/uL (ref 0.00–0.07)
Basophils Absolute: 0 K/uL (ref 0.0–0.1)
Basophils Relative: 0 %
Eosinophils Absolute: 0 K/uL (ref 0.0–0.5)
Eosinophils Relative: 0 %
HCT: 46.4 % — ABNORMAL HIGH (ref 36.0–46.0)
Hemoglobin: 15.8 g/dL — ABNORMAL HIGH (ref 12.0–15.0)
Immature Granulocytes: 1 %
Lymphocytes Relative: 10 %
Lymphs Abs: 1.2 K/uL (ref 0.7–4.0)
MCH: 29.2 pg (ref 26.0–34.0)
MCHC: 34.1 g/dL (ref 30.0–36.0)
MCV: 85.8 fL (ref 80.0–100.0)
Monocytes Absolute: 0.4 K/uL (ref 0.1–1.0)
Monocytes Relative: 4 %
Neutro Abs: 10.6 K/uL — ABNORMAL HIGH (ref 1.7–7.7)
Neutrophils Relative %: 85 %
Platelets: 419 K/uL — ABNORMAL HIGH (ref 150–400)
RBC: 5.41 MIL/uL — ABNORMAL HIGH (ref 3.87–5.11)
RDW: 14.1 % (ref 11.5–15.5)
WBC: 12.4 K/uL — ABNORMAL HIGH (ref 4.0–10.5)
nRBC: 0 % (ref 0.0–0.2)

## 2023-12-08 LAB — LIPASE, BLOOD: Lipase: 26 U/L (ref 11–51)

## 2023-12-08 MED ORDER — POTASSIUM CHLORIDE 10 MEQ/100ML IV SOLN
10.0000 meq | Freq: Once | INTRAVENOUS | Status: AC
  Administered 2023-12-08: 10 meq via INTRAVENOUS
  Filled 2023-12-08: qty 100

## 2023-12-08 MED ORDER — SODIUM CHLORIDE 0.9 % IV BOLUS
1000.0000 mL | Freq: Once | INTRAVENOUS | Status: AC
  Administered 2023-12-08: 1000 mL via INTRAVENOUS

## 2023-12-08 MED ORDER — IOHEXOL 300 MG/ML  SOLN
100.0000 mL | Freq: Once | INTRAMUSCULAR | Status: AC | PRN
  Administered 2023-12-08: 100 mL via INTRAVENOUS

## 2023-12-08 MED ORDER — SODIUM CHLORIDE 0.9 % IV SOLN
3.0000 g | Freq: Once | INTRAVENOUS | Status: AC
  Administered 2023-12-09: 3 g via INTRAVENOUS
  Filled 2023-12-08: qty 8

## 2023-12-08 NOTE — ED Triage Notes (Signed)
 Pt comes in s/s of stomach bug. Pt sates s/s started Monday and has not gotten any better. Pt has nausea, vomiting, diarrhea and abd. Pain. A&Ox4.

## 2023-12-09 ENCOUNTER — Encounter (HOSPITAL_COMMUNITY): Payer: Self-pay | Admitting: Family Medicine

## 2023-12-09 ENCOUNTER — Other Ambulatory Visit: Payer: Self-pay

## 2023-12-09 DIAGNOSIS — F411 Generalized anxiety disorder: Secondary | ICD-10-CM | POA: Diagnosis not present

## 2023-12-09 DIAGNOSIS — E1165 Type 2 diabetes mellitus with hyperglycemia: Secondary | ICD-10-CM

## 2023-12-09 DIAGNOSIS — F32A Depression, unspecified: Secondary | ICD-10-CM | POA: Diagnosis not present

## 2023-12-09 DIAGNOSIS — E876 Hypokalemia: Secondary | ICD-10-CM

## 2023-12-09 DIAGNOSIS — K529 Noninfective gastroenteritis and colitis, unspecified: Secondary | ICD-10-CM | POA: Diagnosis not present

## 2023-12-09 DIAGNOSIS — Z91018 Allergy to other foods: Secondary | ICD-10-CM

## 2023-12-09 DIAGNOSIS — I10 Essential (primary) hypertension: Secondary | ICD-10-CM

## 2023-12-09 LAB — GLUCOSE, CAPILLARY
Glucose-Capillary: 118 mg/dL — ABNORMAL HIGH (ref 70–99)
Glucose-Capillary: 103 mg/dL — ABNORMAL HIGH (ref 70–99)
Glucose-Capillary: 137 mg/dL — ABNORMAL HIGH (ref 70–99)

## 2023-12-09 LAB — BASIC METABOLIC PANEL WITH GFR
Anion gap: 12 (ref 5–15)
BUN: 25 mg/dL — ABNORMAL HIGH (ref 6–20)
CO2: 26 mmol/L (ref 22–32)
Calcium: 9 mg/dL (ref 8.9–10.3)
Chloride: 103 mmol/L (ref 98–111)
Creatinine, Ser: 0.93 mg/dL (ref 0.44–1.00)
GFR, Estimated: >60 mL/min (ref >60)
Glucose, Bld: 110 mg/dL — ABNORMAL HIGH (ref 70–99)
Potassium: 3.1 mmol/L — ABNORMAL LOW (ref 3.5–5.1)
Sodium: 141 mmol/L (ref 135–145)

## 2023-12-09 LAB — CBC
HCT: 39 % (ref 36.0–46.0)
Hemoglobin: 13 g/dL (ref 12.0–15.0)
MCH: 29 pg (ref 26.0–34.0)
MCHC: 33.3 g/dL (ref 30.0–36.0)
MCV: 86.9 fL (ref 80.0–100.0)
Platelets: 326 K/uL (ref 150–400)
RBC: 4.49 MIL/uL (ref 3.87–5.11)
RDW: 14.3 % (ref 11.5–15.5)
WBC: 10 K/uL (ref 4.0–10.5)
nRBC: 0 % (ref 0.0–0.2)

## 2023-12-09 LAB — CBG MONITORING, ED
Glucose-Capillary: 117 mg/dL — ABNORMAL HIGH (ref 70–99)
Glucose-Capillary: 114 mg/dL — ABNORMAL HIGH (ref 70–99)

## 2023-12-09 LAB — MAGNESIUM: Magnesium: 1.9 mg/dL (ref 1.7–2.4)

## 2023-12-09 MED ORDER — METRONIDAZOLE 500 MG/100ML IV SOLN
500.0000 mg | Freq: Two times a day (BID) | INTRAVENOUS | Status: DC
  Administered 2023-12-09 – 2023-12-10 (×3): 500 mg via INTRAVENOUS
  Filled 2023-12-09 (×3): qty 100

## 2023-12-09 MED ORDER — SODIUM CHLORIDE 0.9 % IV SOLN
2.0000 g | INTRAVENOUS | Status: DC
  Administered 2023-12-09 – 2023-12-10 (×2): 2 g via INTRAVENOUS
  Filled 2023-12-09 (×2): qty 20

## 2023-12-09 MED ORDER — ACETAMINOPHEN 325 MG PO TABS: 650.0000 mg | ORAL_TABLET | Freq: Four times a day (QID) | ORAL | Status: DC | PRN

## 2023-12-09 MED ORDER — ACETAMINOPHEN 650 MG RE SUPP
650.0000 mg | Freq: Four times a day (QID) | RECTAL | Status: DC | PRN
Start: 2023-12-09 — End: 2023-12-10

## 2023-12-09 MED ORDER — BUPROPION HCL ER (XL) 150 MG PO TB24
300.0000 mg | ORAL_TABLET | Freq: Every day | ORAL | Status: DC
  Administered 2023-12-09 – 2023-12-10 (×2): 300 mg via ORAL
  Filled 2023-12-09 (×2): qty 2

## 2023-12-09 MED ORDER — ESCITALOPRAM OXALATE 10 MG PO TABS
20.0000 mg | ORAL_TABLET | Freq: Every day | ORAL | Status: DC
  Administered 2023-12-09 – 2023-12-10 (×2): 20 mg via ORAL
  Filled 2023-12-09 (×2): qty 2

## 2023-12-09 MED ORDER — POTASSIUM CHLORIDE 10 MEQ/100ML IV SOLN
10.0000 meq | INTRAVENOUS | Status: AC
  Administered 2023-12-09 (×3): 10 meq via INTRAVENOUS
  Filled 2023-12-09 (×3): qty 100

## 2023-12-09 MED ORDER — SODIUM CHLORIDE 0.9% FLUSH
3.0000 mL | Freq: Two times a day (BID) | INTRAVENOUS | Status: DC
  Administered 2023-12-09 – 2023-12-10 (×2): 3 mL via INTRAVENOUS

## 2023-12-09 MED ORDER — PANTOPRAZOLE SODIUM 40 MG PO TBEC: 40.0000 mg | DELAYED_RELEASE_TABLET | Freq: Every day | ORAL | Status: DC

## 2023-12-09 MED ORDER — PANTOPRAZOLE SODIUM 40 MG PO TBEC
40.0000 mg | DELAYED_RELEASE_TABLET | Freq: Every day | ORAL | Status: DC
  Administered 2023-12-09 – 2023-12-10 (×2): 40 mg via ORAL
  Filled 2023-12-09 (×3): qty 1

## 2023-12-09 MED ORDER — SODIUM CHLORIDE 0.9 % IV SOLN: INTRAVENOUS | Status: AC

## 2023-12-09 MED ORDER — INSULIN ASPART 100 UNIT/ML IJ SOLN
0.0000 [IU] | INTRAMUSCULAR | Status: DC
  Filled 2023-12-09: qty 1

## 2023-12-09 MED ORDER — HYDROMORPHONE HCL 1 MG/ML IJ SOLN: 0.5000 mg | INTRAMUSCULAR | Status: DC | PRN

## 2023-12-09 MED ORDER — OXYCODONE HCL 5 MG PO TABS: 5.0000 mg | ORAL_TABLET | ORAL | Status: DC | PRN

## 2023-12-09 MED ORDER — AMLODIPINE BESYLATE 5 MG PO TABS
10.0000 mg | ORAL_TABLET | Freq: Every day | ORAL | Status: DC
  Administered 2023-12-09 – 2023-12-10 (×2): 10 mg via ORAL
  Filled 2023-12-09 (×2): qty 2

## 2023-12-09 NOTE — H&P (Signed)
 History and Physical    Beverly Elliott FMW:[id number] DOB: Oct 28, 1970 DOA: 12/08/2023  PCP: Jesus Elberta Gainer, FNP   Patient coming from: Home   Chief Complaint: Abdominal pain, N/V/D   HPI: Beverly Elliott is a 53 y.o. female with medical history significant for depression, anxiety, hypertension, and meat allergy  who presents with 3 days of abdominal pain, nausea, vomiting, and diarrhea.  Patient states that the pain is generalized and intermittent.  She has not seen any blood in her vomit or stool.  She had a subjective fever on 12/06/2023.  She is unaware of any sick contacts and denies any recent travel.  ED Course: Upon arrival to the ED, patient is found to be afebrile and saturating well on room air with elevated heart rate and stable blood pressure.  Labs are most notable for potassium 2.9, creatinine 1.24, normal LFTs and lipase, WBC 12,400, and hemoglobin 15.8.  CT findings raise concern for colitis.  Patient was treated with IV antibiotics, IV fluids, and supplemental potassium in the ED.  Review of Systems:  All other systems reviewed and apart from HPI, are negative.  Past Medical History:  Diagnosis Date   Anemia    Asthma    Depression    GERD (gastroesophageal reflux disease)    History of chicken pox    IBS (irritable bowel syndrome)    Kidney stones    Migraine    Urticaria    UTI (lower urinary tract infection)     Past Surgical History:  Procedure Laterality Date   ABDOMINAL HYSTERECTOMY     ADENOIDECTOMY     CESAREAN SECTION     TONSILLECTOMY      Social History:   reports that she has quit smoking. Her smoking use included cigarettes. She has never used smokeless tobacco. She reports that she does not currently use alcohol. She reports current drug use. Drug: Marijuana.  Allergies  Allergen Reactions   Latex Swelling   Metoclopramide  Nausea And Vomiting    Vomited for 8 hours    Alpha-Gal Other (See Comments)    Pork, beef, dairy, gelatin    Heparin  Other (See Comments)    Alpha gal   Beef-Derived Drug Products    Gelatin    Pork-Derived Products    Tape Rash    Adhesive tape    Family History  Problem Relation Age of Onset   Coronary artery disease Mother    Hypertension Mother    Allergic rhinitis Father    Coronary artery disease Father    Heart attack Father    Allergic rhinitis Sister    Diabetes Maternal Grandfather    Hypertension Paternal Grandmother    Asthma Neg Hx    Eczema Neg Hx    Urticaria Neg Hx      Prior to Admission medications   Medication Sig Start Date End Date Taking? Authorizing Provider  amLODipine  (NORVASC ) 10 MG tablet Take 10 mg by mouth daily. 02/22/23   [provider]  buPROPion  (WELLBUTRIN  XL) 300 MG 24 hr tablet Take 300 mg by mouth daily. 09/07/21   [provider]  EPINEPHrine  0.3 mg/0.3 mL IJ SOAJ injection Inject 0.3 mg into the muscle as needed for anaphylaxis. 12/24/22   Lorin Norris, MD  escitalopram  (LEXAPRO ) 20 MG tablet Take 20 mg by mouth daily. 02/19/23   [provider]  naproxen  (NAPROSYN ) 500 MG tablet Take 500 mg by mouth 2 (two) times daily as needed for moderate pain (pain score 4-6). 06/21/23  [provider]  ondansetron  (ZOFRAN -ODT) 4 MG disintegrating tablet Take 1 tablet (4 mg total) by mouth every 8 (eight) hours as needed for nausea or vomiting. 12/31/22   Ricky Fines, MD  pantoprazole  (PROTONIX ) 40 MG tablet Take 1 tablet by mouth daily. 11/03/22   [provider]  tobramycin-dexamethasone (TOBRADEX) ophthalmic solution Place 1 drop into the right eye every 6 (six) hours as needed (dry/itchy eyes). 07/19/23   [provider]  XOLAIR  150 MG/ML prefilled syringe Inject 300 mg into the skin every 28 (twenty-eight) days. 06/20/23   Lorin Norris, MD    Physical Exam: Vitals:   12/08/23 2051 12/08/23 2300 12/08/23 2345 12/09/23 0200  BP: (!) 158/104 124/80 116/71 112/85  Pulse: (!) 117 86 75 69   Resp: 18 18 15 12   Temp: 98 F (36.7 C)     TempSrc: Oral     SpO2: 99% 94% 95% 96%  Weight:      Height:        Constitutional: NAD, no pallor or diaphoresis   Eyes: PERTLA, lids and conjunctivae normal ENMT: Mucous membranes are moist. Posterior pharynx clear of any exudate or lesions.   Neck: supple, no masses  Respiratory: no wheezing, no crackles. No accessory muscle use.  Cardiovascular: S1 & S2 heard, regular rate and rhythm. No extremity edema.   Abdomen: Soft, no guarding. Bowel sounds active.  Musculoskeletal: no clubbing / cyanosis. No joint deformity upper and lower extremities.   Skin: no significant rashes, lesions, ulcers. Warm, dry, well-perfused. Neurologic: CN 2-12 grossly intact. Moving all extremities. Alert and oriented.  Psychiatric: Calm. Cooperative.    Labs and Imaging on Admission: I have personally reviewed following labs and imaging studies  CBC: Recent Labs  Lab 12/08/23 1853  WBC 12.4*  NEUTROABS 10.6*  HGB 15.8*  HCT 46.4*  MCV 85.8  PLT 419*   Basic Metabolic Panel: Recent Labs  Lab 12/08/23 1853  NA 138  K 2.9*  CL 101  CO2 21*  GLUCOSE 184*  BUN 33*  CREATININE 1.24*  CALCIUM  10.2   GFR: Estimated Creatinine Clearance: 53.9 mL/min (A) (by C-G formula based on SCr of 1.24 mg/dL (H)). Liver Function Tests: Recent Labs  Lab 12/08/23 1853  AST 23  ALT 17  ALKPHOS 70  BILITOT 1.0  PROT 8.8*  ALBUMIN 5.0   Recent Labs  Lab 12/08/23 1853  LIPASE 26   No results for input(s): AMMONIA in the last 168 hours. Coagulation Profile: No results for input(s): INR, PROTIME in the last 168 hours. Cardiac Enzymes: No results for input(s): CKTOTAL, CKMB, CKMBINDEX, TROPONINI in the last 168 hours. BNP (last 3 results) No results for input(s): PROBNP in the last 8760 hours. HbA1C: No results for input(s): HGBA1C in the last 72 hours. CBG: No results for input(s): GLUCAP in the last 168 hours. Lipid  Profile: No results for input(s): CHOL, HDL, LDLCALC, TRIG, CHOLHDL, LDLDIRECT in the last 72 hours. Thyroid Function Tests: No results for input(s): TSH, T4TOTAL, FREET4, T3FREE, THYROIDAB in the last 72 hours. Anemia Panel: No results for input(s): VITAMINB12, FOLATE, FERRITIN, TIBC, IRON, RETICCTPCT in the last 72 hours. Urine analysis:    Component Value Date/Time   COLORURINE AMBER (A) 09/05/2023 1240   APPEARANCEUR TURBID (A) 09/05/2023 1240   LABSPEC 1.019 09/05/2023 1240   PHURINE 5.0 09/05/2023 1240   GLUCOSEU 50 (A) 09/05/2023 1240   HGBUR SMALL (A) 09/05/2023 1240   BILIRUBINUR NEGATIVE 09/05/2023 1240   KETONESUR NEGATIVE 09/05/2023 1240  PROTEINUR 100 (A) 09/05/2023 1240   UROBILINOGEN 0.2 09/03/2013 1353   NITRITE NEGATIVE 09/05/2023 1240   LEUKOCYTESUR NEGATIVE 09/05/2023 1240   Sepsis Labs: @LABRCNTIP (procalcitonin:4,lacticidven:4) )No results found for this or any previous visit (from the past 240 hours).   Radiological Exams on Admission: CT ABDOMEN PELVIS W CONTRAST Result Date: 12/08/2023 CLINICAL DATA:  Acute abdominal pain. EXAM: CT ABDOMEN AND PELVIS WITH CONTRAST TECHNIQUE: Multidetector CT imaging of the abdomen and pelvis was performed using the standard protocol following bolus administration of intravenous contrast. RADIATION DOSE REDUCTION: This exam was performed according to the departmental dose-optimization program which includes automated exposure control, adjustment of the mA and/or kV according to patient size and/or use of iterative reconstruction technique. CONTRAST:  OMNIPAQUE  IOHEXOL  300 MG/ML  SOLN COMPARISON:  CT abdomen and pelvis 09/05/2023 FINDINGS: Lower chest: No acute abnormality. Hepatobiliary: There is focal fat along falciform ligament. Gallbladder and bile ducts are within normal limits. Liver otherwise appears normal. Pancreas: Unremarkable. No pancreatic ductal dilatation or surrounding  inflammatory changes. Spleen: Normal in size without focal abnormality. Adrenals/Urinary Tract: Adrenal glands are unremarkable. Kidneys are normal, without renal calculi, focal lesion, or hydronephrosis. The bladder is decompressed, but within normal limits. Stomach/Bowel: There is mild diffuse colonic wall thickening versus normal under distension. There is no inflammatory stranding, pneumatosis or free air. The appendix is within normal limits. The stomach is decompressed stomach is under distended. Vascular/Lymphatic: No significant vascular findings are present. No enlarged abdominal or pelvic lymph nodes. Reproductive: Status post hysterectomy. No adnexal masses. Other: No abdominal wall hernia or abnormality. No abdominopelvic ascites. Musculoskeletal: Degenerative changes affect the spine. IMPRESSION: 1. Mild diffuse colonic wall thickening versus normal under distension. Correlate clinically for colitis. 2. No other acute localizing process in the abdomen or pelvis. Electronically Signed   By: Greig Pique M.D.   On: 12/08/2023 22:30    Assessment/Plan   1. Acute colitis  - Check C diff testing and GI pathogen panel, continue empiric antibiotics, continue IVF hydration, monitor/correct electrolytes, advance diet as tolerated    2. Hypokalemia  - Replacing   3. AKI  - Continue IVF hydration, repeat chem panel in am    4. Hypertension  - Norvasc     5. Depression, anxiety  - Wellbutrin , Lexapro     6. Meat allergy   - Avoid meat products    DVT prophylaxis: SCDs  Code Status: Full  Level of Care: Level of care: Telemetry Family Communication: Husband at bedside  Disposition Plan:  Patient is from: home  Anticipated d/c is to: home  Anticipated d/c date is: 12/11/23  Patient currently: Pending advancement of diet, improved electrolytes and renal function  Consults called: None  Admission status: Inpatient     Evalene GORMAN Sprinkles, MD [facility]  12/09/2023, 4:58 AM

## 2023-12-09 NOTE — Progress Notes (Signed)
 PROGRESS NOTE  Beverly Elliott  FMW:[id number] DOB: 10-03-1970 DOA: 12/08/2023 PCP: Jesus Elberta Gainer, FNP  Consultants  Brief Narrative: 53 y.o. female with medical history significant for depression, anxiety, hypertension, and alpha-gal meat allergy  who presents with 3 days of abdominal pain, nausea, vomiting, and diarrhea.  Patient states that the pain is generalized and intermittent.  She has not seen any blood in her vomit or stool.  She had a subjective fever on 12/06/2023.  She is unaware of any sick contacts and denies any recent travel.  Labs are most notable for potassium 2.9, creatinine 1.24, normal LFTs and lipase, WBC 12,400, and hemoglobin 15.8.  CT findings raise concern for colitis.  Admitted for the same.    Assessment & Plan: Acute colitis  - Much improved today.   - s/p hydration and antiemetics.  No further vomiting/diarrhea since coming to the floor.   - Unable to draw C dif or stool panel secondary to no further BMs - Plan for now will be to continue empiric antibiotics, continue IVF hydration, monitor/correct electrolytes, advance diet as tolerated-->starting on soft diet    2. Hypokalemia  - Replacing again 8/1, repeat in AM   3. AKI  - Continue IVF hydration, repeat chem panel in am   - resolved    4. Hypertension  - Norvasc      5. Depression, anxiety  - Wellbutrin , Lexapro      6. Meat allergy   - Avoid meat products     DVT prophylaxis:  SCDs Start: 12/09/23 0448  Code Status:   Code Status: Full Code Level of care: Telemetry Status is: Inpatient  Consults called: none   Subjective: Feels much better today than she has since prior to coming to the hospital.  No further N/V/diarrhea.  Hungry and would like to eat.    Objective: Vitals:   12/09/23 0200 12/09/23 0742 12/09/23 1031 12/09/23 1403  BP: 112/85 133/89 (!) 140/97 131/89  Pulse: 69 79 64 66  Resp: 12 15 16 16   Temp:  97.9 F (36.6 C) 98.3 F (36.8 C) 97.8 F (36.6 C)  TempSrc:   Oral Oral Oral  SpO2: 96% 96% 99% 97%  Weight:      Height:        Intake/Output Summary (Last 24 hours) at 12/09/2023 1553 Last data filed at 12/09/2023 1300 Gross per 24 hour  Intake 1660 ml  Output --  Net 1660 ml   Filed Weights   12/08/23 1559  Weight: 83.9 kg   Body mass index is 33.84 kg/m.  Gen: 53 y.o. female in no apparent distress.  Nontoxic Pulm: Non-labored breathing.  Clear to auscultation bilaterally.  CV: Regular rate and rhythm. No murmur, rub, or gallop. No JVD GI: Abdomen soft, non-tender, non-distended Ext: Warm, no deformities, no pedal edema Skin: No rashes, lesions no ulcers Neuro: Alert and oriented. No focal neurological deficits. Psych: Calm  Judgement and insight appear normal. Mood & affect appropriate.     I have personally reviewed the following labs and images: CBC: Recent Labs  Lab 12/08/23 1853 12/09/23 0533  WBC 12.4* 10.0  NEUTROABS 10.6*  --   HGB 15.8* 13.0  HCT 46.4* 39.0  MCV 85.8 86.9  PLT 419* 326   BMP &GFR Recent Labs  Lab 12/08/23 1853 12/09/23 0533  NA 138 141  K 2.9* 3.1*  CL 101 103  CO2 21* 26  GLUCOSE 184* 110*  BUN 33* 25*  CREATININE 1.24* 0.93  CALCIUM  10.2 9.0  MG  --  1.9   Estimated Creatinine Clearance: 71.9 mL/min (by C-G formula based on SCr of 0.93 mg/dL). Liver & Pancreas: Recent Labs  Lab 12/08/23 1853  AST 23  ALT 17  ALKPHOS 70  BILITOT 1.0  PROT 8.8*  ALBUMIN 5.0   Recent Labs  Lab 12/08/23 1853  LIPASE 26   No results for input(s): AMMONIA in the last 168 hours. Diabetic: No results for input(s): HGBA1C in the last 72 hours. Recent Labs  Lab 12/09/23 0614 12/09/23 0747 12/09/23 1108  GLUCAP 117* 114* 118*   Cardiac Enzymes: No results for input(s): CKTOTAL, CKMB, CKMBINDEX, TROPONINI in the last 168 hours. No results for input(s): PROBNP in the last 8760 hours. Coagulation Profile: No results for input(s): INR, PROTIME in the last 168 hours. Thyroid  Function Tests: No results for input(s): TSH, T4TOTAL, FREET4, T3FREE, THYROIDAB in the last 72 hours. Lipid Profile: No results for input(s): CHOL, HDL, LDLCALC, TRIG, CHOLHDL, LDLDIRECT in the last 72 hours. Anemia Panel: No results for input(s): VITAMINB12, FOLATE, FERRITIN, TIBC, IRON, RETICCTPCT in the last 72 hours. Urine analysis:    Component Value Date/Time   COLORURINE AMBER (A) 09/05/2023 1240   APPEARANCEUR TURBID (A) 09/05/2023 1240   LABSPEC 1.019 09/05/2023 1240   PHURINE 5.0 09/05/2023 1240   GLUCOSEU 50 (A) 09/05/2023 1240   HGBUR SMALL (A) 09/05/2023 1240   BILIRUBINUR NEGATIVE 09/05/2023 1240   KETONESUR NEGATIVE 09/05/2023 1240   PROTEINUR 100 (A) 09/05/2023 1240   UROBILINOGEN 0.2 09/03/2013 1353   NITRITE NEGATIVE 09/05/2023 1240   LEUKOCYTESUR NEGATIVE 09/05/2023 1240   Sepsis Labs: Invalid input(s): PROCALCITONIN, LACTICIDVEN  Microbiology: No results found for this or any previous visit (from the past 240 hours).  Radiology Studies: CT ABDOMEN PELVIS W CONTRAST Result Date: 12/08/2023 CLINICAL DATA:  Acute abdominal pain. EXAM: CT ABDOMEN AND PELVIS WITH CONTRAST TECHNIQUE: Multidetector CT imaging of the abdomen and pelvis was performed using the standard protocol following bolus administration of intravenous contrast. RADIATION DOSE REDUCTION: This exam was performed according to the departmental dose-optimization program which includes automated exposure control, adjustment of the mA and/or kV according to patient size and/or use of iterative reconstruction technique. CONTRAST:  OMNIPAQUE  IOHEXOL  300 MG/ML  SOLN COMPARISON:  CT abdomen and pelvis 09/05/2023 FINDINGS: Lower chest: No acute abnormality. Hepatobiliary: There is focal fat along falciform ligament. Gallbladder and bile ducts are within normal limits. Liver otherwise appears normal. Pancreas: Unremarkable. No pancreatic ductal dilatation or surrounding  inflammatory changes. Spleen: Normal in size without focal abnormality. Adrenals/Urinary Tract: Adrenal glands are unremarkable. Kidneys are normal, without renal calculi, focal lesion, or hydronephrosis. The bladder is decompressed, but within normal limits. Stomach/Bowel: There is mild diffuse colonic wall thickening versus normal under distension. There is no inflammatory stranding, pneumatosis or free air. The appendix is within normal limits. The stomach is decompressed stomach is under distended. Vascular/Lymphatic: No significant vascular findings are present. No enlarged abdominal or pelvic lymph nodes. Reproductive: Status post hysterectomy. No adnexal masses. Other: No abdominal wall hernia or abnormality. No abdominopelvic ascites. Musculoskeletal: Degenerative changes affect the spine. IMPRESSION: 1. Mild diffuse colonic wall thickening versus normal under distension. Correlate clinically for colitis. 2. No other acute localizing process in the abdomen or pelvis. Electronically Signed   By: Greig Pique M.D.   On: 12/08/2023 22:30    Scheduled Meds:  amLODipine   10 mg Oral Daily   buPROPion   300 mg Oral Daily   escitalopram   20 mg Oral Daily   insulin   aspart  0-6 Units Subcutaneous Q4H   pantoprazole   40 mg Oral Daily   sodium chloride  flush  3 mL Intravenous Q12H   Continuous Infusions:  sodium chloride  125 mL/hr at 12/09/23 1045   cefTRIAXone (ROCEPHIN)  IV 2 g (12/09/23 0618)   metronidazole 500 mg (12/09/23 0737)     LOS: 1 day   35 minutes with more than 50% spent in reviewing records, counseling patient/family and coordinating care.  Reyes VEAR Gaw, MD [facility] [email or web address] 12/09/2023, 3:53 PM

## 2023-12-09 NOTE — Progress Notes (Signed)
   12/09/23 1411  TOC Brief Assessment  Insurance and Status Reviewed  Patient has primary care physician Yes  Home environment has been reviewed From home  Prior level of function: Independent  Prior/Current Home Services No current home services  Social Drivers of Health Review SDOH reviewed no interventions necessary  Readmission risk has been reviewed Yes  Transition of care needs no transition of care needs at this time   Transition of Care Department [facility]) has reviewed patient and no TOC needs have been identified at this time. We will continue to monitor patient advancement through interdisciplinary progression rounds. If new patient transition needs arise, please place a TOC consult.

## 2023-12-10 DIAGNOSIS — K529 Noninfective gastroenteritis and colitis, unspecified: Secondary | ICD-10-CM | POA: Diagnosis not present

## 2023-12-10 LAB — BASIC METABOLIC PANEL WITH GFR
Anion gap: 11 (ref 5–15)
BUN: 9 mg/dL (ref 6–20)
CO2: 25 mmol/L (ref 22–32)
Calcium: 8.8 mg/dL — ABNORMAL LOW (ref 8.9–10.3)
Chloride: 105 mmol/L (ref 98–111)
Creatinine, Ser: 0.77 mg/dL (ref 0.44–1.00)
GFR, Estimated: >60 mL/min (ref >60)
Glucose, Bld: 96 mg/dL (ref 70–99)
Potassium: 2.8 mmol/L — ABNORMAL LOW (ref 3.5–5.1)
Sodium: 141 mmol/L (ref 135–145)

## 2023-12-10 LAB — GLUCOSE, CAPILLARY
Glucose-Capillary: 108 mg/dL — ABNORMAL HIGH (ref 70–99)
Glucose-Capillary: 95 mg/dL (ref 70–99)
Glucose-Capillary: 98 mg/dL (ref 70–99)

## 2023-12-10 LAB — CBC
HCT: 36.6 % (ref 36.0–46.0)
Hemoglobin: 12.2 g/dL (ref 12.0–15.0)
MCH: 29.5 pg (ref 26.0–34.0)
MCHC: 33.3 g/dL (ref 30.0–36.0)
MCV: 88.6 fL (ref 80.0–100.0)
Platelets: 276 K/uL (ref 150–400)
RBC: 4.13 MIL/uL (ref 3.87–5.11)
RDW: 14.5 % (ref 11.5–15.5)
WBC: 7.9 K/uL (ref 4.0–10.5)
nRBC: 0 % (ref 0.0–0.2)

## 2023-12-10 MED ORDER — ONDANSETRON 4 MG PO TBDP: 4.0000 mg | ORAL_TABLET | Freq: Three times a day (TID) | ORAL | 0 refills | Status: AC | PRN

## 2023-12-10 MED ORDER — POTASSIUM CHLORIDE CRYS ER 20 MEQ PO TBCR: 20.0000 meq | EXTENDED_RELEASE_TABLET | Freq: Every day | ORAL | 0 refills | Status: AC

## 2023-12-10 MED ORDER — POTASSIUM CHLORIDE CRYS ER 20 MEQ PO TBCR
40.0000 meq | EXTENDED_RELEASE_TABLET | Freq: Once | ORAL | Status: AC
  Administered 2023-12-10: 40 meq via ORAL
  Filled 2023-12-10: qty 2

## 2023-12-10 MED ORDER — AMOXICILLIN-POT CLAVULANATE 875-125 MG PO TABS: 1.0000 | ORAL_TABLET | Freq: Two times a day (BID) | ORAL | 0 refills | Status: AC

## 2023-12-10 NOTE — Plan of Care (Signed)
  Problem: Education: Goal: Ability to describe self-care measures that may prevent or decrease complications (Diabetes Survival Skills Education) will improve Outcome: Adequate for Discharge Goal: Individualized Educational Video(s) Outcome: Adequate for Discharge   Problem: Coping: Goal: Ability to adjust to condition or change in health will improve 12/10/2023 1106 by Delores Kirsch, RN Outcome: Adequate for Discharge 12/10/2023 1105 by Delores Kirsch, RN Outcome: Progressing   Problem: Fluid Volume: Goal: Ability to maintain a balanced intake and output will improve 12/10/2023 1106 by Delores Kirsch, RN Outcome: Adequate for Discharge 12/10/2023 1105 by Delores Kirsch, RN Outcome: Progressing   Problem: Health Behavior/Discharge Planning: Goal: Ability to identify and utilize available resources and services will improve Outcome: Adequate for Discharge Goal: Ability to manage health-related needs will improve Outcome: Adequate for Discharge   Problem: Metabolic: Goal: Ability to maintain appropriate glucose levels will improve Outcome: Adequate for Discharge   Problem: Nutritional: Goal: Maintenance of adequate nutrition will improve Outcome: Adequate for Discharge Goal: Progress toward achieving an optimal weight will improve Outcome: Adequate for Discharge   Problem: Skin Integrity: Goal: Risk for impaired skin integrity will decrease Outcome: Adequate for Discharge   Problem: Tissue Perfusion: Goal: Adequacy of tissue perfusion will improve Outcome: Adequate for Discharge   Problem: Education: Goal: Knowledge of General Education information will improve Description: Including pain rating scale, medication(s)/side effects and non-pharmacologic comfort measures Outcome: Adequate for Discharge   Problem: Health Behavior/Discharge Planning: Goal: Ability to manage health-related needs will improve Outcome: Adequate for Discharge   Problem: Clinical  Measurements: Goal: Ability to maintain clinical measurements within normal limits will improve Outcome: Adequate for Discharge Goal: Will remain free from infection Outcome: Adequate for Discharge Goal: Diagnostic test results will improve Outcome: Adequate for Discharge Goal: Respiratory complications will improve Outcome: Adequate for Discharge Goal: Cardiovascular complication will be avoided Outcome: Adequate for Discharge   Problem: Activity: Goal: Risk for activity intolerance will decrease Outcome: Adequate for Discharge   Problem: Nutrition: Goal: Adequate nutrition will be maintained Outcome: Adequate for Discharge

## 2023-12-10 NOTE — Plan of Care (Signed)
   Problem: Coping: Goal: Ability to adjust to condition or change in health will improve Outcome: Progressing   Problem: Fluid Volume: Goal: Ability to maintain a balanced intake and output will improve Outcome: Progressing

## 2023-12-10 NOTE — Progress Notes (Signed)
 Removed IV-CDI. Reviewed d/c paperwork with patient and answered questions. Stable patient walked out with husband.

## 2023-12-10 NOTE — Plan of Care (Signed)
  Problem: Fluid Volume: Goal: Ability to maintain a balanced intake and output will improve Outcome: Progressing   Problem: Nutritional: Goal: Maintenance of adequate nutrition will improve Outcome: Progressing   Problem: Clinical Measurements: Goal: Will remain free from infection Outcome: Progressing

## 2023-12-10 NOTE — Discharge Summary (Signed)
 Physician Discharge Summary   Patient: Beverly Elliott MRN: [id number] DOB: April 27, 1971  Admit date:     12/08/2023  Discharge date: 12/10/23  Discharge Physician: Reyes VEAR Gaw   PCP: Jesus Elberta Gainer, FNP   Recommendations at discharge:   Patient much improved at discharge.  Potassium remained low while in house.  Replaced on day of discharge and she was also discharged with 20 mill equivalents p.o. daily for the next 7 days with recommendations to follow-up with PCP in the next 7 to 10 days for repeat BMP to ensure potassium has normalized.  Discharged home to complete course of antibiotics on Augmentin  1 tab p.o. twice daily for the next 7 days. Also discharged home with short-term course of Zofran  in case nausea/vomiting recurs with recommendations to return to the ER if Zofran  does not help with symptoms.  Discharge Diagnoses: Principal Problem:   Acute colitis Active Problems:   Allergy  to alpha-gal   Essential hypertension   Hypokalemia   Anxiety state   Depression   Type 2 diabetes mellitus with hyperglycemia (HCC)  Resolved Problems:   * No resolved hospital problems. *  Hospital Course: 53 y.o. female with medical history significant for depression, anxiety, hypertension, and alpha-gal meat allergy  who presents with 3 days of abdominal pain, nausea, vomiting, and diarrhea.  Patient states that the pain is generalized and intermittent.  She has not seen any blood in her vomit or stool.  She had a subjective fever on 12/06/2023.  She is unaware of any sick contacts and denies any recent travel.  Labs are most notable for potassium 2.9, creatinine 1.24, normal LFTs and lipase, WBC 12,400, and hemoglobin 15.8.  CT findings raise concern for colitis.  Admitted for the same.    Exhibited marked improvement after admission to the hospital.  No further nausea or vomiting or diarrhea since being admitted to the hospital.  She has been able to tolerate regular diet both evening  prior to discharge as well as breakfast day of discharge.  Feels back to normal now.  Due to degree of improvement she was discharged home on 12/10/2023 with diagnosis of acute colitis resolved with IV hydration and antibiotics.  Assessment and Plan: Acute colitis  - Remains much improved today. - Able to hydrate herself orally without any further IV fluids.  She has not needed any antiemetics or pain medications. -  No vomiting/diarrhea since coming to the floor.   - Unable to draw C dif or stool panel secondary to no further BMs - Able to discharge home today with oral antibiotics--> Augmentin  p.o. twice daily for 7 days.  Tolerating regular diet for past several meals.   2. Hypokalemia  - Potassium remains low again today at 2.8 -Replaced again 8/2 x 2 doses and she was discharged home with 7-day course of 20 mEq potassium today daily.   3. AKI  - Completely resolved   4. Hypertension  - Norvasc      5. Depression, anxiety  - Wellbutrin , Lexapro      6. Meat allergy   - Avoid meat products         Disposition: Home Diet recommendation:  Discharge Diet Orders (From admission, onward)     Start     Ordered   12/10/23 0000  Diet - low sodium heart healthy        12/10/23 0936           Regular diet DISCHARGE MEDICATION: Allergies as of 12/10/2023  Reactions   Latex Swelling   Metoclopramide  Nausea And Vomiting   Vomited for 8 hours   Alpha-gal Other (See Comments)   Pork, beef, dairy, gelatin   Heparin  Other (See Comments)   Alpha gal   Beef-derived Drug Products    Gelatin    Pork-derived Products    Tape Rash   Adhesive tape        Medication List     TAKE these medications    amLODipine  10 MG tablet Commonly known as: NORVASC  Take 10 mg by mouth daily.   amoxicillin -clavulanate 875-125 MG tablet Commonly known as: AUGMENTIN  Take 1 tablet by mouth 2 (two) times daily for 7 days.   buPROPion  300 MG 24 hr tablet Commonly known as:  WELLBUTRIN  XL Take 300 mg by mouth daily.   EPINEPHrine  0.3 mg/0.3 mL Soaj injection Commonly known as: EPI-PEN Inject 0.3 mg into the muscle as needed for anaphylaxis.   escitalopram  20 MG tablet Commonly known as: LEXAPRO  Take 20 mg by mouth daily.   ondansetron  4 MG disintegrating tablet Commonly known as: ZOFRAN -ODT Take 1 tablet (4 mg total) by mouth every 8 (eight) hours as needed for nausea or vomiting.   pantoprazole  40 MG tablet Commonly known as: PROTONIX  Take 1 tablet by mouth daily.   potassium chloride  SA 20 MEQ tablet Commonly known as: KLOR-CON  M Take 1 tablet (20 mEq total) by mouth daily.   Xolair  150 MG/ML prefilled syringe Generic drug: omalizumab  Inject 300 mg into the skin every 28 (twenty-eight) days.        Discharge Exam: Filed Weights   12/08/23 1559  Weight: 83.9 kg   Gen: 53 y.o. female in no apparent distress.  Nontoxic, looks very well.  Finishing pancake breakfast on my examination Pulm: Non-labored breathing.  Clear to auscultation bilaterally.  CV: Regular rate and rhythm. No murmur, rub, or gallop. No JVD GI: Abdomen soft, non-tender, non-distended Ext: Warm, no deformities, no pedal edema Skin: No rashes, lesions no ulcers Neuro: Alert and oriented. No focal neurological deficits. Psych: Calm  Judgement and insight appear normal. Mood & affect appropriate.  Condition at discharge: Good  The results of significant diagnostics from this hospitalization (including imaging, microbiology, ancillary and laboratory) are listed below for reference.   Imaging Studies: CT ABDOMEN PELVIS W CONTRAST Result Date: 12/08/2023 CLINICAL DATA:  Acute abdominal pain. EXAM: CT ABDOMEN AND PELVIS WITH CONTRAST TECHNIQUE: Multidetector CT imaging of the abdomen and pelvis was performed using the standard protocol following bolus administration of intravenous contrast. RADIATION DOSE REDUCTION: This exam was performed according to the departmental  dose-optimization program which includes automated exposure control, adjustment of the mA and/or kV according to patient size and/or use of iterative reconstruction technique. CONTRAST:  OMNIPAQUE  IOHEXOL  300 MG/ML  SOLN COMPARISON:  CT abdomen and pelvis 09/05/2023 FINDINGS: Lower chest: No acute abnormality. Hepatobiliary: There is focal fat along falciform ligament. Gallbladder and bile ducts are within normal limits. Liver otherwise appears normal. Pancreas: Unremarkable. No pancreatic ductal dilatation or surrounding inflammatory changes. Spleen: Normal in size without focal abnormality. Adrenals/Urinary Tract: Adrenal glands are unremarkable. Kidneys are normal, without renal calculi, focal lesion, or hydronephrosis. The bladder is decompressed, but within normal limits. Stomach/Bowel: There is mild diffuse colonic wall thickening versus normal under distension. There is no inflammatory stranding, pneumatosis or free air. The appendix is within normal limits. The stomach is decompressed stomach is under distended. Vascular/Lymphatic: No significant vascular findings are present. No enlarged abdominal or pelvic lymph nodes.  Reproductive: Status post hysterectomy. No adnexal masses. Other: No abdominal wall hernia or abnormality. No abdominopelvic ascites. Musculoskeletal: Degenerative changes affect the spine. IMPRESSION: 1. Mild diffuse colonic wall thickening versus normal under distension. Correlate clinically for colitis. 2. No other acute localizing process in the abdomen or pelvis. Electronically Signed   By: Greig Pique M.D.   On: 12/08/2023 22:30    Microbiology: Results for orders placed or performed during the hospital encounter of 12/29/22  MRSA Next Gen by PCR, Nasal     Status: None   Collection Time: 12/30/22 12:35 AM   Specimen: Nasal Mucosa; Nasal Swab  Result Value Ref Range Status   MRSA by PCR Next Gen NOT DETECTED NOT DETECTED Final    Comment: (NOTE) The GeneXpert MRSA  Assay (FDA approved for NASAL specimens only), is one component of a comprehensive MRSA colonization surveillance program. It is not intended to diagnose MRSA infection nor to guide or monitor treatment for MRSA infections. Test performance is not FDA approved in patients less than 70 years old. Performed at [facility], [address], [place], [place] [address]     Labs: CBC: Recent Labs  Lab 12/08/23 1853 12/09/23 0533 12/10/23 0451  WBC 12.4* 10.0 7.9  NEUTROABS 10.6*  --   --   HGB 15.8* 13.0 12.2  HCT 46.4* 39.0 36.6  MCV 85.8 86.9 88.6  PLT 419* 326 276   Basic Metabolic Panel: Recent Labs  Lab 12/08/23 1853 12/09/23 0533 12/10/23 0451  NA 138 141 141  K 2.9* 3.1* 2.8*  CL 101 103 105  CO2 21* 26 25  GLUCOSE 184* 110* 96  BUN 33* 25* 9  CREATININE 1.24* 0.93 0.77  CALCIUM  10.2 9.0 8.8*  MG  --  1.9  --    Liver Function Tests: Recent Labs  Lab 12/08/23 1853  AST 23  ALT 17  ALKPHOS 70  BILITOT 1.0  PROT 8.8*  ALBUMIN 5.0   CBG: Recent Labs  Lab 12/09/23 1638 12/09/23 2100 12/10/23 0013 12/10/23 0409 12/10/23 0712  GLUCAP 103* 137* 108* 95 98    Discharge time spent: Less than 30 minutes.  Signed: Reyes VEAR Gaw, MD [facility] 12/10/2023

## 2023-12-13 ENCOUNTER — Ambulatory Visit

## 2023-12-20 ENCOUNTER — Ambulatory Visit

## 2023-12-20 DIAGNOSIS — Z91018 Allergy to other foods: Secondary | ICD-10-CM

## 2023-12-22 NOTE — ED Provider Notes (Signed)
 [facility] MEDICAL SURGICAL UNIT Provider Note   CSN: [id number] Arrival date & time: 12/08/23  1543     Patient presents with: Nausea, Emesis, Abdominal Pain, and Diarrhea   Beverly Elliott is a 53 y.o. female.   Patient presents with nausea vomiting and diarrhea  The history is provided by the patient and medical records. No language interpreter was used.  Emesis Severity:  Moderate Timing:  Constant Quality:  Bilious material Able to tolerate:  Liquids Progression:  Worsening Chronicity:  New Recent urination:  Normal Relieved by:  Nothing Worsened by:  Nothing Ineffective treatments:  None tried Associated symptoms: abdominal pain and diarrhea   Associated symptoms: no cough and no headaches   Abdominal Pain Associated symptoms: diarrhea and vomiting   Associated symptoms: no chest pain, no cough, no fatigue and no hematuria   Diarrhea Associated symptoms: abdominal pain and vomiting   Associated symptoms: no headaches        Prior to Admission medications   Medication Sig Start Date End Date Taking? Authorizing Provider  amLODipine  (NORVASC ) 10 MG tablet Take 10 mg by mouth daily. 02/22/23  Yes [provider]  buPROPion  (WELLBUTRIN  XL) 300 MG 24 hr tablet Take 300 mg by mouth daily. 09/07/21  Yes [provider]  EPINEPHrine  0.3 mg/0.3 mL IJ SOAJ injection Inject 0.3 mg into the muscle as needed for anaphylaxis. 12/24/22  Yes Lorin Norris, MD  escitalopram  (LEXAPRO ) 20 MG tablet Take 20 mg by mouth daily. 02/19/23  Yes [provider]  ondansetron  (ZOFRAN -ODT) 4 MG disintegrating tablet Take 1 tablet (4 mg total) by mouth every 8 (eight) hours as needed for nausea or vomiting. 12/31/22  Yes Ricky Fines, MD  ondansetron  (ZOFRAN -ODT) 4 MG disintegrating tablet Take 1 tablet (4 mg total) by mouth every 8 (eight) hours as needed for nausea or vomiting. 12/10/23  Yes Elpidio Reyes DEL, MD  pantoprazole  (PROTONIX ) 40 MG tablet Take 1 tablet  by mouth daily. 11/03/22  Yes [provider]  potassium chloride  SA (KLOR-CON  M) 20 MEQ tablet Take 1 tablet (20 mEq total) by mouth daily. 12/10/23  Yes Elpidio Reyes DEL, MD  XOLAIR  150 MG/ML prefilled syringe Inject 300 mg into the skin every 28 (twenty-eight) days. 06/20/23  Yes Lorin Norris, MD    Allergies: Latex, Metoclopramide , Alpha-gal, Heparin , Beef-derived drug products, Gelatin, Pork-derived products, and Tape    Review of Systems  Constitutional:  Negative for appetite change and fatigue.  HENT:  Negative for congestion, ear discharge and sinus pressure.   Eyes:  Negative for discharge.  Respiratory:  Negative for cough.   Cardiovascular:  Negative for chest pain.  Gastrointestinal:  Positive for abdominal pain, diarrhea and vomiting.  Genitourinary:  Negative for frequency and hematuria.  Musculoskeletal:  Negative for back pain.  Skin:  Negative for rash.  Neurological:  Negative for seizures and headaches.  Psychiatric/Behavioral:  Negative for hallucinations.     Updated Vital Signs BP (!) 159/100 (BP Location: Left Arm)   Pulse 65   Temp 98 F (36.7 C) (Oral)   Resp 16   Ht 5' 2 (1.575 m)   Wt 83.9 kg   SpO2 95%   BMI 33.84 kg/m   Physical Exam Vitals and nursing note reviewed.  Constitutional:      Appearance: She is well-developed.  HENT:     Head: Normocephalic.     Nose: Nose normal.  Eyes:     General: No scleral icterus.    Conjunctiva/sclera: Conjunctivae normal.  Neck:     Thyroid: No thyromegaly.  Cardiovascular:     Rate and Rhythm: Normal rate and regular rhythm.     Heart sounds: No murmur heard.    No friction rub. No gallop.  Pulmonary:     Breath sounds: No stridor. No wheezing or rales.  Chest:     Chest wall: No tenderness.  Abdominal:     General: There is no distension.     Tenderness: There is abdominal tenderness. There is no rebound.  Musculoskeletal:        General: Normal range of motion.     Cervical  back: Neck supple.  Lymphadenopathy:     Cervical: No cervical adenopathy.  Skin:    Findings: No erythema or rash.  Neurological:     Mental Status: She is alert and oriented to person, place, and time.     Motor: No abnormal muscle tone.     Coordination: Coordination normal.  Psychiatric:        Behavior: Behavior normal.     (all labs ordered are listed, but only abnormal results are displayed) Labs Reviewed  CBC WITH DIFFERENTIAL/PLATELET - Abnormal; Notable for the following components:      Result Value   WBC 12.4 (*)    RBC 5.41 (*)    Hemoglobin 15.8 (*)    HCT 46.4 (*)    Platelets 419 (*)    Neutro Abs 10.6 (*)    All other components within normal limits  COMPREHENSIVE METABOLIC PANEL WITH GFR - Abnormal; Notable for the following components:   Potassium 2.9 (*)    CO2 21 (*)    Glucose, Bld 184 (*)    BUN 33 (*)    Creatinine, Ser 1.24 (*)    Total Protein 8.8 (*)    GFR, Estimated 53 (*)    Anion gap 16 (*)    All other components within normal limits  BASIC METABOLIC PANEL WITH GFR - Abnormal; Notable for the following components:   Potassium 3.1 (*)    Glucose, Bld 110 (*)    BUN 25 (*)    All other components within normal limits  GLUCOSE, CAPILLARY - Abnormal; Notable for the following components:   Glucose-Capillary 118 (*)    All other components within normal limits  GLUCOSE, CAPILLARY - Abnormal; Notable for the following components:   Glucose-Capillary 103 (*)    All other components within normal limits  BASIC METABOLIC PANEL WITH GFR - Abnormal; Notable for the following components:   Potassium 2.8 (*)    Calcium  8.8 (*)    All other components within normal limits  GLUCOSE, CAPILLARY - Abnormal; Notable for the following components:   Glucose-Capillary 137 (*)    All other components within normal limits  GLUCOSE, CAPILLARY - Abnormal; Notable for the following components:   Glucose-Capillary 108 (*)    All other components within  normal limits  CBG MONITORING, ED - Abnormal; Notable for the following components:   Glucose-Capillary 117 (*)    All other components within normal limits  CBG MONITORING, ED - Abnormal; Notable for the following components:   Glucose-Capillary 114 (*)    All other components within normal limits  LIPASE, BLOOD  MAGNESIUM   CBC  CBC  GLUCOSE, CAPILLARY  GLUCOSE, CAPILLARY    EKG: None  Radiology: No results found.   Procedures   Medications Ordered in the ED  0.9 %  sodium chloride  infusion ( Intravenous New Bag/Given 12/09/23 1045)  sodium  chloride 0.9 % bolus 1,000 mL (0 mLs Intravenous Stopped 12/09/23 0608)  potassium chloride  10 mEq in 100 mL IVPB (0 mEq Intravenous Stopped 12/09/23 0609)  potassium chloride  10 mEq in 100 mL IVPB (0 mEq Intravenous Stopped 12/09/23 0609)  iohexol  (OMNIPAQUE ) 300 MG/ML solution 100 mL (100 mLs Intravenous Contrast Given 12/08/23 2216)  Ampicillin -Sulbactam (UNASYN ) 3 g in sodium chloride  0.9 % 100 mL IVPB (0 g Intravenous Stopped 12/09/23 0609)  potassium chloride  10 mEq in 100 mL IVPB (10 mEq Intravenous New Bag/Given 12/09/23 0858)  potassium chloride  SA (KLOR-CON  M) CR tablet 40 mEq (40 mEq Oral Given 12/10/23 0954)  potassium chloride  SA (KLOR-CON  M) CR tablet 40 mEq (40 mEq Oral Given 12/10/23 1059)                                    Medical Decision Making Amount and/or Complexity of Data Reviewed Labs: ordered. Radiology: ordered.  Risk Prescription drug management. Decision regarding hospitalization.   Patient with colitis and will be admitted to medicine     Final diagnoses:  Colitis    ED Discharge Orders          Ordered    amoxicillin -clavulanate (AUGMENTIN ) 875-125 MG tablet  2 times daily        12/10/23 0931    potassium chloride  SA (KLOR-CON  M) 20 MEQ tablet  Daily        12/10/23 0931    Increase activity slowly        12/10/23 0936    Diet - low sodium heart healthy        12/10/23 0936    Call MD for:   temperature >100.4        12/10/23 0936    Call MD for:  persistant nausea and vomiting        12/10/23 0936    Call MD for:  severe uncontrolled pain        12/10/23 0936    Call MD for:  difficulty breathing, headache or visual disturbances        12/10/23 0936    (HEART FAILURE PATIENTS) Call MD:  Anytime you have any of the following symptoms: 1) 3 pound weight gain in 24 hours or 5 pounds in 1 week 2) shortness of breath, with or without a dry hacking cough 3) swelling in the hands, feet or stomach 4) if you have to sleep on extra pillows at night in order to breathe.        12/10/23 0936    Discharge instructions       Comments: Your potassium has been a little low, which is to be expected after a bout of vomiting and diarrhea.  Take the potassium pill 20 mEq daily for 7 days and then stop.  Take the Augmentin  antibiotic 1 pill in the AM and 1 pill in the PM for the next 7 days.    I've also sent you home with some zofran  for nausea relief if it returns, though if it continues even after taking the medicine you should come back to the ER immediately.   12/10/23 0936    ondansetron  (ZOFRAN -ODT) 4 MG disintegrating tablet  Every 8 hours PRN        12/10/23 9062               Beverly Maniscalco, MD 12/22/23 [phone]

## 2024-01-18 ENCOUNTER — Ambulatory Visit

## 2024-02-02 ENCOUNTER — Ambulatory Visit (INDEPENDENT_AMBULATORY_CARE_PROVIDER_SITE_OTHER)

## 2024-02-02 DIAGNOSIS — Z91018 Allergy to other foods: Secondary | ICD-10-CM

## 2024-03-01 ENCOUNTER — Encounter: Payer: Self-pay | Admitting: Family Medicine

## 2024-03-01 ENCOUNTER — Other Ambulatory Visit: Payer: Self-pay

## 2024-03-01 ENCOUNTER — Ambulatory Visit

## 2024-03-01 ENCOUNTER — Ambulatory Visit: Admitting: Family Medicine

## 2024-03-01 VITALS — BP 120/80 | HR 78 | Temp 98.2°F | Ht 62.0 in | Wt 193.0 lb

## 2024-03-01 DIAGNOSIS — Z91018 Allergy to other foods: Secondary | ICD-10-CM | POA: Diagnosis not present

## 2024-03-01 DIAGNOSIS — B999 Unspecified infectious disease: Secondary | ICD-10-CM | POA: Diagnosis not present

## 2024-03-01 DIAGNOSIS — J3089 Other allergic rhinitis: Secondary | ICD-10-CM

## 2024-03-01 DIAGNOSIS — J302 Other seasonal allergic rhinitis: Secondary | ICD-10-CM | POA: Diagnosis not present

## 2024-03-01 NOTE — Patient Instructions (Addendum)
 Allergic rhinitis Continue allergen avoidance measures directed toward grass pollen, tree pollen, mold, and dog as listed below Continue Nasonex  2 sprays in each nostril once a day if needed for runny nose or itch Remember to rotate to a different antihistamine about every 3 months. Some examples of over the counter antihistamines include Zyrtec  (cetirizine ), Xyzal (levocetirizine), Allegra (fexofenadine), and Claritin (loratidine).   Consider saline nasal rinses as needed for nasal symptoms. Use this before any medicated nasal sprays for best result  Alpha gal allergy  Continue strict avoidance of mammalian products. In case of an allergic reaction, take cetirizine  10 mg once every 12-24 hours, and if life-threatening symptoms occur, inject with EpiPen  0.3 mg. Continue cetirizine  10 mg twice a day to control hives or itch Continue montelukast 10 mg once a day Add famotidine  20 mg once or twice a day if needed for itch or hives Continue Xolair  injections 300 mg once every 2 weeks and have access to an epinephrine  autoinjector set per protocol  Recurrent infection Keep track of infections, antibiotic use, and steroid use  Call the clinic if this treatment plan is not working well for you.  Follow up in 6 months or sooner if needed.  Reducing Pollen Exposure The American Academy of All[employer] , Ast[employer] and [employer] suggests the following steps to reduce your exposure to pollen during allergy  seasons. Do not hang sheets or clothing out to dry; pollen may collect on these items. Do not mow lawns or spend time around freshly cut grass; mowing stirs up pollen. Keep windows closed at night.  Keep car windows closed while driving. Minimize morning activities outdoors, a time when pollen counts are usually at their highest. Stay indoors as much as possible when pollen counts or humidity is high and on windy days when pollen tends to remain in the air longer. Use air conditioning when possible.  Many  air conditioners have filters that trap the pollen spores. Use a HEPA room air filter to remove pollen form the indoor air you breathe.  Control of Mold Allergen Mold and fungi can grow on a variety of surfaces provided certain temperature and moisture conditions exist.  Outdoor molds grow on plants, decaying vegetation and soil.  The major outdoor mold, Alternaria and Cladosporium, are found in very high numbers during hot and dry conditions.  Generally, a late Summer - Fall peak is seen for common outdoor fungal spores.  Rain will temporarily lower outdoor mold spore count, but counts rise rapidly when the rainy period ends.  The most important indoor molds are Aspergillus and Penicillium.  Dark, humid and poorly ventilated basements are ideal sites for mold growth.  The next most common sites of mold growth are the bathroom and the kitchen.  Outdoor [employer] Use air conditioning and keep windows closed Avoid exposure to decaying vegetation. Avoid leaf raking. Avoid grain handling. Consider wearing a face mask if working in moldy areas.  Indoor Mold Control Maintain humidity below 50%. Clean washable surfaces with 5% bleach solution. Remove sources e.g. Contaminated carpets.  Control of Dog or Cat Allergen Avoidance is the best way to manage a dog or cat allergy . If you have a dog or cat and are allergic to dog or cats, consider removing the dog or cat from the home. If you have a dog or cat but don't want to find it a new home, or if your family wants a pet even though someone in the household is allergic, here are some strategies that may help  keep symptoms at bay:  Keep the pet out of your bedroom and restrict it to only a few rooms. Be advised that keeping the dog or cat in only one room will not limit the allergens to that room. Don't pet, hug or kiss the dog or cat; if you do, wash your hands with soap and water. High-efficiency particulate air (HEPA) cleaners run continuously in  a bedroom or living room can reduce allergen levels over time. Regular use of a high-efficiency vacuum cleaner or a central vacuum can reduce allergen levels. Giving your dog or cat a bath at least once a week can reduce airborne allergen.

## 2024-03-01 NOTE — Progress Notes (Signed)
 [address] [place] [address] Dept: [phone]  FOLLOW UP NOTE  Patient ID: Beverly Elliott, female    DOB: 11-28-1970  Age: 53 y.o. MRN: [id number] Date of Office Visit: 03/01/2024  Assessment  Chief Complaint: Follow-up (Alpha Gal/No concerns )  HPI Beverly Elliott is a 53 year old female who presents to clinic for a follow-up visit.  She was last seen in this clinic on 06/17/2023 by Dr. Lorin for evaluation of allergic rhinitis, alpha gal allergy , and urticaria.    Discussed the use of AI scribe software for clinical note transcription with the patient, who gave verbal consent to proceed.  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  She experiences allergic reactions related to Alpha-Gal syndrome, primarily characterized by prolonged episodes of vomiting lasting 12 to 18 hours, occurring approximately every 10 minutes. These episodes have led to hospital visits in the past due to significant drops in potassium levels. Her last Alpha-Gal related episode occurred in late April, just before her daughter's wedding in early May. She has not had a hospital visit for Alpha-Gal syndrome since then.  She mentions a recent tick bite but has not noticed any changes in her symptoms since the incident. She continues to avoid beef and pork products, as well as byproducts that may trigger her symptoms. She has recently been able to reintroduce dairy into her diet without issues. Her last alpha gal testing via lab on 08/24/2022 indicated alpha gal IgE 0.11 with negative components.  She is not interested in retesting her alpha-gal level at today's visit as she remains symptomatic with any exposure to mammal meat.  EpiPen  set up to date.  She also experiences seasonal allergies, with symptoms including sneezing, coughing, and nasal congestion, which occasionally settle  in her chest. She manages these symptoms with over-the-counter Nasacort  nasal spray and Allegra antihistamine, particularly during allergy  season. She reports a slight nasal drainage, which she attributes to an allergy  to dogs, despite owning three dogs.  She reports that her husband recently rescued a cat who lives at their house. Her last environmental allergy  skin testing on 12/10/2022 was positive to grass pollen, weed pollen, tree pollen, mold, and dog.  She denies any infections since her last visit to this clinic.    Her current medications are listed in the chart.  Drug Allergies:  Allergies  Allergen Reactions   Latex Swelling   Metoclopramide  Nausea And Vomiting    Vomited for 8 hours    Alpha-Gal Other (See Comments)    Pork, beef, dairy, gelatin   Heparin  Other (See Comments)    Alpha gal   Bovine (Beef) Protein-Containing Drug Products    Gelatin    Porcine (Pork) Protein-Containing Drug Products    Tape Rash    Adhesive tape    Physical Exam: BP 120/80   Pulse 78   Temp 98.2 F (36.8 C)   Ht 5' 2 (1.575 m)   Wt 193 lb (87.5 kg)   SpO2 98%   BMI 35.30 kg/m    Physical Exam Vitals reviewed.  Constitutional:      Appearance: Normal appearance.  HENT:     Head: Normocephalic and atraumatic.     Right Ear: Tympanic membrane normal.     Left Ear: Tympanic membrane normal.     Nose:     Comments: Bilateral nares normal.  Pharynx normal.  Ears normal.  Eyes normal.    Mouth/Throat:     Pharynx: Oropharynx is clear.  Eyes:     Conjunctiva/sclera: Conjunctivae normal.  Cardiovascular:     Rate and Rhythm: Normal rate and regular rhythm.     Heart sounds: Normal heart sounds. No murmur heard. Pulmonary:     Effort: Pulmonary effort is normal.     Breath sounds: Normal breath sounds.     Comments: Lungs clear to auscultation Musculoskeletal:        General: Normal range of motion.     Cervical back: Normal range of motion and neck supple.  Skin:     General: Skin is warm and dry.  Neurological:     Mental Status: She is alert and oriented to person, place, and time.  Psychiatric:        Mood and Affect: Mood normal.        Behavior: Behavior normal.        Thought Content: Thought content normal.        Judgment: Judgment normal.     Assessment and Plan: 1. Allergy  to alpha-gal   2. Seasonal and perennial allergic rhinitis   3. Recurrent infections     Patient Instructions  Allergic rhinitis Continue allergen avoidance measures directed toward grass pollen, tree pollen, mold, and dog as listed below Continue Nasonex  2 sprays in each nostril once a day if needed for runny nose or itch Remember to rotate to a different antihistamine about every 3 months. Some examples of over the counter antihistamines include Zyrtec  (cetirizine ), Xyzal (levocetirizine), Allegra (fexofenadine), and Claritin (loratidine).   Consider saline nasal rinses as needed for nasal symptoms. Use this before any medicated nasal sprays for best result  Alpha gal allergy  Continue strict avoidance of mammalian products. In case of an allergic reaction, take cetirizine  10 mg once every 12-24 hours, and if life-threatening symptoms occur, inject with EpiPen  0.3 mg. Continue cetirizine  10 mg twice a day to control hives or itch Continue montelukast 10 mg once a day Add famotidine  20 mg once or twice a day if needed for itch or hives Continue Xolair  injections 300 mg once every 2 weeks and have access to an epinephrine  autoinjector set per protocol  Recurrent infection Keep track of infections, antibiotic use, and steroid use  Call the clinic if this treatment plan is not working well for you.  Follow up in 6 months or sooner if needed.                                                                                                    Return in about 6 months  (around 08/30/2024), or if symptoms worsen or fail to improve.    Thank you for the opportunity to care for this patient.  Please do not hesitate to contact me with questions.  Arlean Mutter, FNP [facility] 

## 2024-03-29 ENCOUNTER — Ambulatory Visit (INDEPENDENT_AMBULATORY_CARE_PROVIDER_SITE_OTHER)

## 2024-03-29 DIAGNOSIS — Z91018 Allergy to other foods: Secondary | ICD-10-CM

## 2024-04-01 ENCOUNTER — Emergency Department (HOSPITAL_COMMUNITY)
Admission: EM | Admit: 2024-04-01 | Discharge: 2024-04-01 | Disposition: A | Discharge status: Home / Self Care | Attending: Emergency Medicine | Admitting: Emergency Medicine

## 2024-04-01 ENCOUNTER — Other Ambulatory Visit: Payer: Self-pay

## 2024-04-01 ENCOUNTER — Encounter (HOSPITAL_COMMUNITY): Payer: Self-pay | Admitting: *Deleted

## 2024-04-01 DIAGNOSIS — R112 Nausea with vomiting, unspecified: Secondary | ICD-10-CM | POA: Insufficient documentation

## 2024-04-01 DIAGNOSIS — J45909 Unspecified asthma, uncomplicated: Secondary | ICD-10-CM | POA: Insufficient documentation

## 2024-04-01 DIAGNOSIS — Z7951 Long term (current) use of inhaled steroids: Secondary | ICD-10-CM | POA: Diagnosis not present

## 2024-04-01 DIAGNOSIS — Z91040 Latex allergy status: Secondary | ICD-10-CM | POA: Diagnosis not present

## 2024-04-01 DIAGNOSIS — R197 Diarrhea, unspecified: Secondary | ICD-10-CM | POA: Insufficient documentation

## 2024-04-01 DIAGNOSIS — R1084 Generalized abdominal pain: Secondary | ICD-10-CM | POA: Diagnosis not present

## 2024-04-01 DIAGNOSIS — Z79899 Other long term (current) drug therapy: Secondary | ICD-10-CM | POA: Diagnosis not present

## 2024-04-01 DIAGNOSIS — I10 Essential (primary) hypertension: Secondary | ICD-10-CM | POA: Insufficient documentation

## 2024-04-01 LAB — COMPREHENSIVE METABOLIC PANEL WITH GFR
ALT: 15 U/L (ref 0–44)
AST: 21 U/L (ref 15–41)
Albumin: 4.8 g/dL (ref 3.5–5.0)
Alkaline Phosphatase: 80 U/L (ref 38–126)
Anion gap: 17 — ABNORMAL HIGH (ref 5–15)
BUN: 31 mg/dL — ABNORMAL HIGH (ref 6–20)
CO2: 23 mmol/L (ref 22–32)
Calcium: 9.7 mg/dL (ref 8.9–10.3)
Chloride: 96 mmol/L — ABNORMAL LOW (ref 98–111)
Creatinine, Ser: 1.26 mg/dL — ABNORMAL HIGH (ref 0.44–1.00)
GFR, Estimated: 51 mL/min — ABNORMAL LOW (ref >60)
Glucose, Bld: 163 mg/dL — ABNORMAL HIGH (ref 70–99)
Potassium: 2.9 mmol/L — ABNORMAL LOW (ref 3.5–5.1)
Sodium: 137 mmol/L (ref 135–145)
Total Bilirubin: 0.6 mg/dL (ref 0.0–1.2)
Total Protein: 7.9 g/dL (ref 6.5–8.1)

## 2024-04-01 LAB — CBC WITH DIFFERENTIAL/PLATELET
Abs Immature Granulocytes: 0.03 K/uL (ref 0.00–0.07)
Basophils Absolute: 0 K/uL (ref 0.0–0.1)
Basophils Relative: 0 %
Eosinophils Absolute: 0.1 K/uL (ref 0.0–0.5)
Eosinophils Relative: 1 %
HCT: 41.8 % (ref 36.0–46.0)
Hemoglobin: 14.5 g/dL (ref 12.0–15.0)
Immature Granulocytes: 0 %
Lymphocytes Relative: 22 %
Lymphs Abs: 2.1 K/uL (ref 0.7–4.0)
MCH: 29.4 pg (ref 26.0–34.0)
MCHC: 34.7 g/dL (ref 30.0–36.0)
MCV: 84.8 fL (ref 80.0–100.0)
Monocytes Absolute: 0.6 K/uL (ref 0.1–1.0)
Monocytes Relative: 7 %
Neutro Abs: 6.5 K/uL (ref 1.7–7.7)
Neutrophils Relative %: 70 %
Platelets: 407 K/uL — ABNORMAL HIGH (ref 150–400)
RBC: 4.93 MIL/uL (ref 3.87–5.11)
RDW: 14.8 % (ref 11.5–15.5)
WBC: 9.4 K/uL (ref 4.0–10.5)
nRBC: 0 % (ref 0.0–0.2)

## 2024-04-01 LAB — URINALYSIS, W/ REFLEX TO CULTURE (INFECTION SUSPECTED)
Bilirubin Urine: NEGATIVE
Glucose, UA: NEGATIVE mg/dL
Ketones, ur: 5 mg/dL — AB
Leukocytes,Ua: NEGATIVE
Nitrite: NEGATIVE
Protein, ur: 100 mg/dL — AB
Specific Gravity, Urine: 1.03 (ref 1.005–1.030)
pH: 5 (ref 5.0–8.0)

## 2024-04-01 LAB — LACTIC ACID, PLASMA: Lactic Acid, Venous: 1.9 mmol/L (ref 0.5–1.9)

## 2024-04-01 MED ORDER — POTASSIUM CHLORIDE 10 MEQ/100ML IV SOLN
10.0000 meq | Freq: Once | INTRAVENOUS | Status: AC
Start: 2024-04-01 — End: 2024-04-01
  Filled 2024-04-01: qty 100

## 2024-04-01 MED ORDER — TRIMETHOBENZAMIDE HCL 100 MG/ML IM SOLN
200.0000 mg | Freq: Once | INTRAMUSCULAR | Status: AC
  Administered 2024-04-01: 200 mg via INTRAMUSCULAR
  Filled 2024-04-01: qty 2

## 2024-04-01 MED ORDER — LORAZEPAM 2 MG/ML IJ SOLN
1.0000 mg | Freq: Once | INTRAMUSCULAR | Status: AC
  Administered 2024-04-01: 1 mg via INTRAVENOUS
  Filled 2024-04-01: qty 1

## 2024-04-01 MED ORDER — LORAZEPAM 1 MG PO TABS: 1.0000 mg | ORAL_TABLET | Freq: Three times a day (TID) | ORAL | 0 refills | Status: AC | PRN

## 2024-04-01 MED ORDER — LORAZEPAM 1 MG PO TABS
1.0000 mg | ORAL_TABLET | Freq: Once | ORAL | Status: DC
Start: 2024-04-01 — End: 2024-04-01
  Filled 2024-04-01: qty 1

## 2024-04-01 MED ORDER — HYDRALAZINE HCL 20 MG/ML IJ SOLN
5.0000 mg | Freq: Once | INTRAMUSCULAR | Status: AC
  Administered 2024-04-01: 5 mg via INTRAVENOUS
  Filled 2024-04-01: qty 1

## 2024-04-01 MED ORDER — TRIMETHOBENZAMIDE HCL 100 MG/ML IM SOLN: 200.0000 mg | Freq: Once | INTRAMUSCULAR | Status: DC

## 2024-04-01 MED ORDER — SODIUM CHLORIDE 0.9 % IV BOLUS
1000.0000 mL | Freq: Once | INTRAVENOUS | Status: AC
  Administered 2024-04-01: 1000 mL via INTRAVENOUS

## 2024-04-01 MED ORDER — POTASSIUM CHLORIDE 10 MEQ/100ML IV SOLN
INTRAVENOUS | Status: AC
  Administered 2024-04-01: 10 meq via INTRAVENOUS
  Filled 2024-04-01: qty 100

## 2024-04-01 MED ORDER — POTASSIUM CHLORIDE CRYS ER 20 MEQ PO TBCR
40.0000 meq | EXTENDED_RELEASE_TABLET | Freq: Once | ORAL | Status: DC
  Filled 2024-04-01: qty 2

## 2024-04-01 MED ORDER — ONDANSETRON HCL 4 MG/2ML IJ SOLN
4.0000 mg | Freq: Once | INTRAMUSCULAR | Status: AC
  Administered 2024-04-01: 4 mg via INTRAVENOUS
  Filled 2024-04-01: qty 2

## 2024-04-01 NOTE — ED Provider Notes (Signed)
 [facility]MENT AT [facility] Provider Note   CSN: [id number] Arrival date & time: 04/01/24  1123     Patient presents with: Emesis   Beverly Elliott is a 53 y.o. female with history of alpha gal presents with complaints of nausea, vomiting and diarrhea since Friday.  Denies significant abdominal pain no urinary or vaginal symptoms.  No chest pain or shortness of breath reported.  Noted to be hypertensive, reports that she had been unable to take any of her blood pressure medications.  Reports that her symptoms feel similar to a  viral illness.  Endorses 1-2 fever during initial presentation of symptoms that has since resolved.  Does not feel similar to alpha-gal.  Does endorse daily cannabis use.  Reports history of C-section x 5, no other abdominal surgeries.  Appears that she has been admitted for acute colitis with hypokalemia in the past.    Emesis     Past Medical History:  Diagnosis Date   Anemia    Asthma    Depression    GERD (gastroesophageal reflux disease)    History of chicken pox    IBS (irritable bowel syndrome)    Kidney stones    Migraine    Urticaria    UTI (lower urinary tract infection)    Past Surgical History:  Procedure Laterality Date   ABDOMINAL HYSTERECTOMY     ADENOIDECTOMY     CESAREAN SECTION     TONSILLECTOMY       Prior to Admission medications   Medication Sig Start Date End Date Taking? Authorizing Provider  LORazepam  (ATIVAN ) 1 MG tablet Take 1 tablet (1 mg total) by mouth 3 (three) times daily as needed for anxiety. 04/01/24  Yes Donnajean Lynwood DEL, PA-C  amLODipine  (NORVASC ) 10 MG tablet Take 10 mg by mouth daily. 02/22/23   [provider]  buPROPion  (WELLBUTRIN  XL) 300 MG 24 hr tablet Take 300 mg by mouth daily. 09/07/21   [provider]  EPINEPHrine  0.3 mg/0.3 mL IJ SOAJ injection Inject 0.3 mg into the muscle as needed for anaphylaxis. 12/24/22   Lorin Norris, MD  escitalopram   (LEXAPRO ) 20 MG tablet Take 20 mg by mouth daily. 02/19/23   [provider]  ondansetron  (ZOFRAN -ODT) 4 MG disintegrating tablet Take 1 tablet (4 mg total) by mouth every 8 (eight) hours as needed for nausea or vomiting. 12/31/22   Ricky Fines, MD  ondansetron  (ZOFRAN -ODT) 4 MG disintegrating tablet Take 1 tablet (4 mg total) by mouth every 8 (eight) hours as needed for nausea or vomiting. 12/10/23   Elpidio Reyes DEL, MD  pantoprazole  (PROTONIX ) 40 MG tablet Take 1 tablet by mouth daily. 11/03/22   [provider]  potassium chloride  SA (KLOR-CON  M) 20 MEQ tablet Take 1 tablet (20 mEq total) by mouth daily. 12/10/23   Elpidio Reyes DEL, MD  XOLAIR  150 MG/ML prefilled syringe Inject 300 mg into the skin every 28 (twenty-eight) days. 06/20/23   Lorin Norris, MD    Allergies: Latex, Metoclopramide , Alpha-gal, Heparin , Bovine (beef) protein-containing drug products, Gelatin, Porcine (pork) protein-containing drug products, and Tape    Review of Systems  Gastrointestinal:  Positive for vomiting.    Updated Vital Signs BP 120/74   Pulse 70   Temp 98.1 F (36.7 C) (Oral)   Resp 14   Ht 5' 2 (1.575 m)   Wt 81.6 kg   SpO2 97%   BMI 32.92 kg/m   Physical Exam Vitals and nursing note reviewed.  Constitutional:      Appearance: She is well-developed.     Comments: Uncomfortable appearing  HENT:     Head: Normocephalic and atraumatic.  Eyes:     Conjunctiva/sclera: Conjunctivae normal.  Cardiovascular:     Rate and Rhythm: Normal rate and regular rhythm.     Heart sounds: No murmur heard. Pulmonary:     Effort: Pulmonary effort is normal. No respiratory distress.     Breath sounds: Normal breath sounds.  Abdominal:     Palpations: Abdomen is soft.     Tenderness: There is abdominal tenderness.     Comments: Very mild generalized abdominal tenderness, abdomen soft and nondistended, negative Murphy sign, no focality to McBurney's point  Musculoskeletal:         General: No swelling.     Cervical back: Neck supple.  Skin:    General: Skin is warm and dry.     Capillary Refill: Capillary refill takes less than 2 seconds.  Neurological:     Mental Status: She is alert.  Psychiatric:        Mood and Affect: Mood normal.     (all labs ordered are listed, but only abnormal results are displayed) Labs Reviewed  COMPREHENSIVE METABOLIC PANEL WITH GFR - Abnormal; Notable for the following components:      Result Value   Potassium 2.9 (*)    Chloride 96 (*)    Glucose, Bld 163 (*)    BUN 31 (*)    Creatinine, Ser 1.26 (*)    GFR, Estimated 51 (*)    Anion gap 17 (*)    All other components within normal limits  CBC WITH DIFFERENTIAL/PLATELET - Abnormal; Notable for the following components:   Platelets 407 (*)    All other components within normal limits  URINALYSIS, W/ REFLEX TO CULTURE (INFECTION SUSPECTED) - Abnormal; Notable for the following components:   APPearance HAZY (*)    Hgb urine dipstick SMALL (*)    Ketones, ur 5 (*)    Protein, ur 100 (*)    Bacteria, UA RARE (*)    All other components within normal limits  LACTIC ACID, PLASMA    EKG: EKG Interpretation Date/Time:  Sunday April 01 2024 13:52:46 EST Ventricular Rate:  94 PR Interval:  153 QRS Duration:  90 QT Interval:  432 QTC Calculation: 541 R Axis:   84  Text Interpretation: Sinus rhythm Right atrial enlargement Borderline T abnormalities, anterior leads Prolonged QT interval Confirmed by Cleotilde Rogue ([id number]) on 04/01/2024 1:58:57 PM  Radiology: No results found.   Procedures   Medications Ordered in the ED  potassium chloride  SA (KLOR-CON  M) CR tablet 40 mEq (40 mEq Oral Patient Refused/Not Given 04/01/24 1614)  sodium chloride  0.9 % bolus 1,000 mL (0 mLs Intravenous Stopped 04/01/24 1332)  ondansetron  (ZOFRAN ) injection 4 mg (4 mg Intravenous Given 04/01/24 1222)  potassium chloride  10 mEq in 100 mL IVPB (0 mEq Intravenous Stopped 04/01/24 1731)   sodium chloride  0.9 % bolus 1,000 mL (0 mLs Intravenous Stopped 04/01/24 1731)  trimethobenzamide  (TIGAN ) injection 200 mg (200 mg Intramuscular Given 04/01/24 1414)  hydrALAZINE  (APRESOLINE ) injection 5 mg (5 mg Intravenous Given 04/01/24 1554)  LORazepam  (ATIVAN ) injection 1 mg (1 mg Intravenous Given 04/01/24 1555)    Clinical Course as of 04/01/24 1736  Sun Apr 01, 2024  1140 Patient with history of alpha gal and daily cannabis use evaluated for complaints of nausea, vomiting and diarrhea without significant abdominal pain since Friday.  Initially  had wanted to fever.  Upon arrival she is hypertensive to 194/116 and tachycardic to 106.  She is uncomfortable appearing and tachypneic.  She has very mild generalized abdominal tenderness.  Her abdomen is soft and nondistended.  Will begin with routine labs and IV fluids with antiemetics. [JT]  1241 CBC with Differential(!) No leukocytosis, hemoglobin stable [JT]  1302 Comprehensive metabolic panel(!) Mild AKI with creatinine 1.26, baseline 0.77, potassium of 2.9, however near baseline, anion gap of 17, bicarb within normal limits glucose of 163 [JT]  1541 Reevaluated, continues to complain of significant nausea.  States that Ativan  has only thing that helps.  Will provide a dose here. [JT]  1707 Reevaluated, reports improvement of symptoms.  Will p.o. challenge [JT]  1722 Patient tolerating p.o.  She reports she feels much better.  Will be discharged home.  Encourage close PCP follow-up.  She was in agreement plan.  Strict return precautions provided.  Will provide only a few tablets of Ativan . [JT]    Clinical Course User Index [JT] Donnajean Lynwood DEL, PA-C                                 Medical Decision Making Amount and/or Complexity of Data Reviewed Labs: ordered. Decision-making details documented in ED Course.  Risk Prescription drug management.   This patient presents to the ED with chief complaint(s) of nausea, vomiting  diarrhea.  The complaint involves an extensive differential diagnosis and also carries with it a high risk of complications and morbidity.   Pertinent past medical history as listed in HPI  The differential diagnosis includes  Gastroenteritis, cannabis hyperemesis, cholecystitis, appendicitis, diverticulitis Additional history obtained: Records reviewed Care Everywhere/External Records  Disposition:   Patient will be discharged home. The patient has been appropriately medically screened and/or stabilized in the ED. I have low suspicion for any other emergent medical condition which would require further screening, evaluation or treatment in the ED or require inpatient management. At time of discharge the patient is hemodynamically stable and in no acute distress. I have discussed work-up results and diagnosis with patient and answered all questions. Patient is agreeable with discharge plan. We discussed strict return precautions for returning to the emergency department and they verbalized understanding.     Social Determinants of Health:   none  This note was dictated with voice recognition software.  Despite best efforts at proofreading, errors may have occurred which can change the documentation meaning.       Final diagnoses:  Nausea vomiting and diarrhea    ED Discharge Orders          Ordered    LORazepam  (ATIVAN ) 1 MG tablet  3 times daily PRN        04/01/24 1724               Donnajean Lynwood DEL DEVONNA 04/01/24 1736    Cleotilde Rogue, MD 04/02/24 [phone]

## 2024-04-01 NOTE — ED Triage Notes (Signed)
 Pt with emesis since Friday, diarrhea on Friday, some diarrhea has continued.  Fever on Friday, denies any since.

## 2024-04-01 NOTE — Discharge Instructions (Addendum)
 You were evaluated in the emergency room for nausea, vomiting and diarrhea.  Your lab work did not show any significant abnormality.  Please follow-up with your primary care doctor to ensure your symptoms are improving.  If you experience any worsening symptoms please return to the emergency room.  A few tablets of Ativan  has been sent to your pharmacy.

## 2024-04-01 NOTE — ED Notes (Signed)
Pt aware we need urine sample.  

## 2024-04-26 ENCOUNTER — Ambulatory Visit

## 2024-04-26 DIAGNOSIS — Z91018 Allergy to other foods: Secondary | ICD-10-CM

## 2024-05-24 ENCOUNTER — Ambulatory Visit

## 2024-06-05 ENCOUNTER — Ambulatory Visit: Payer: Self-pay

## 2024-06-07 ENCOUNTER — Telehealth: Payer: Self-pay | Admitting: Internal Medicine

## 2024-06-07 NOTE — Telephone Encounter (Signed)
 Pt called and stated that she needs a pre auth on her XOLAIR  150 MG/ML prefilled syringe [[id number]] due to new insurance

## 2024-06-08 ENCOUNTER — Telehealth: Payer: Self-pay

## 2024-06-08 ENCOUNTER — Other Ambulatory Visit (HOSPITAL_COMMUNITY): Payer: Self-pay

## 2024-06-08 NOTE — Telephone Encounter (Signed)
 Pending clarification on dosing, chart states 300mg  q 2 weeks but patient has been coming in once a month

## 2024-06-13 ENCOUNTER — Other Ambulatory Visit (HOSPITAL_COMMUNITY): Payer: Self-pay

## 2024-06-13 NOTE — Telephone Encounter (Signed)
 Clarification on dose states: Xolair  300mg  q 4 weeks. Proceeding with authorization.

## 2024-06-13 NOTE — Telephone Encounter (Signed)
 Pharmacy Patient Advocate Encounter   Received notification from Pt Calls Messages that prior authorization for Xolair  150MG /ML syringes   is required/requested.   Insurance verification completed.   The patient is insured through [employer].   Per test claim: PA required; PA started via CoverMyMeds. KEY [id number] . Please see clinical question(s) below that I am not finding the answer to in their chart and advise.   Has the patient had a positive skin prick test response to one or more foods?
